# Patient Record
Sex: Female | Born: 1940
Health system: Southern US, Community
[De-identification: ages and names within clinical notes are randomized; demographics above are authoritative.]

## PROBLEM LIST (undated history)

## (undated) DIAGNOSIS — I10 Essential (primary) hypertension: Secondary | ICD-10-CM

## (undated) DIAGNOSIS — E119 Type 2 diabetes mellitus without complications: Secondary | ICD-10-CM

## (undated) HISTORY — PX: TONSILLECTOMY: SUR1361

## (undated) HISTORY — PX: HERNIA REPAIR: SHX51

---

## 2010-03-13 ENCOUNTER — Ambulatory Visit: Payer: Self-pay | Admitting: Cardiology

## 2010-03-13 ENCOUNTER — Inpatient Hospital Stay (HOSPITAL_COMMUNITY)
Admission: EM | Admit: 2010-03-13 | Discharge: 2010-03-16 | Payer: Self-pay | Source: Home / Self Care | Admitting: Emergency Medicine

## 2010-03-13 IMAGING — CT CT ANGIO CHEST
2 of 6 series · 19 of 36 positions shown · IV contrast (APPLIED)
Comparison: Chest x-ray today.

CLINICAL DATA: Shortness of breath and bilateral lower extremity
pain.

CT ANGIOGRAPHY CHEST WITH CONTRAST
TECHNIQUE: Multidetector CT imaging of the chest was performed
using the standard protocol during bolus administration of
intravenous contrast.  Multiplanar CT image reconstructions
including MIPs were obtained to evaluate the vascular anatomy.
Contrast:  100 ml [75] IV

[Series 6: pe xxl thins @1mm · axial · 0.67mm/px · z∈[-205,-7]mm · 18 of 222 slices shown]
[im 12/222  lung]
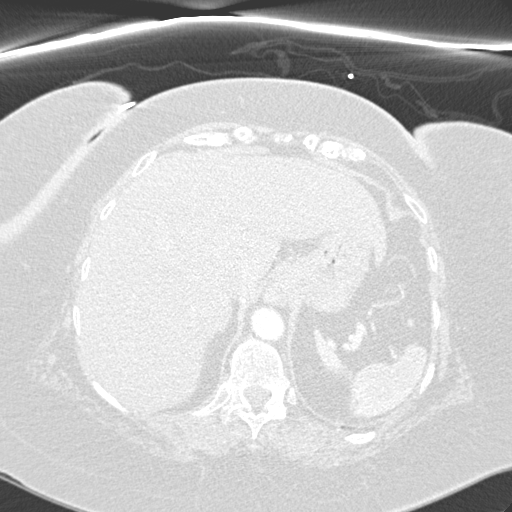
[im 23/222  mediastinal]
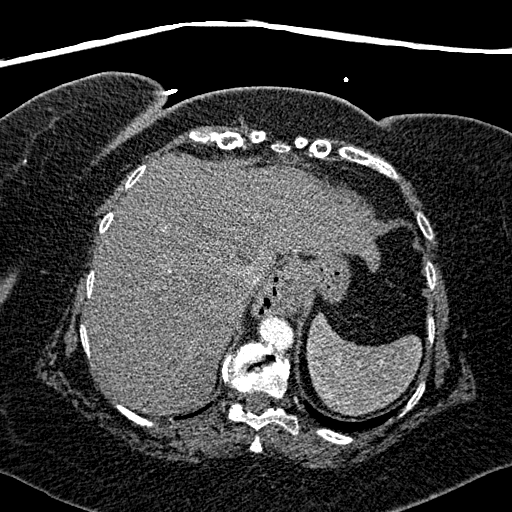
[im 34/222  lung]
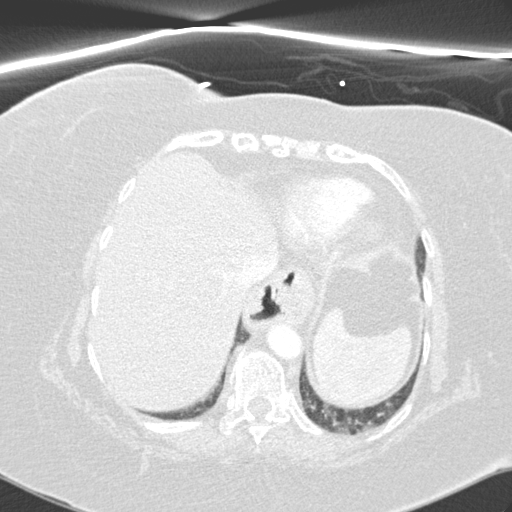
[im 45/222  mediastinal]
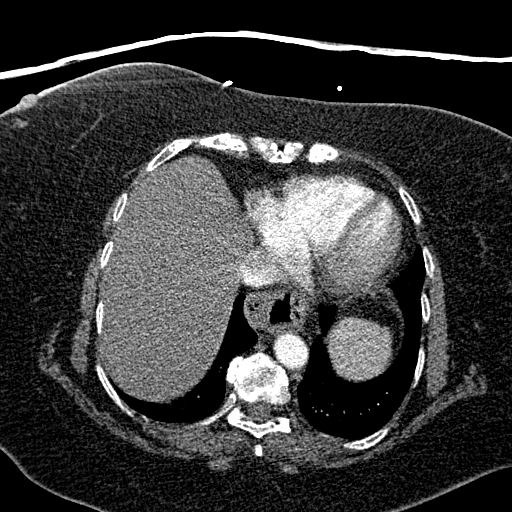
[im 56/222  lung]
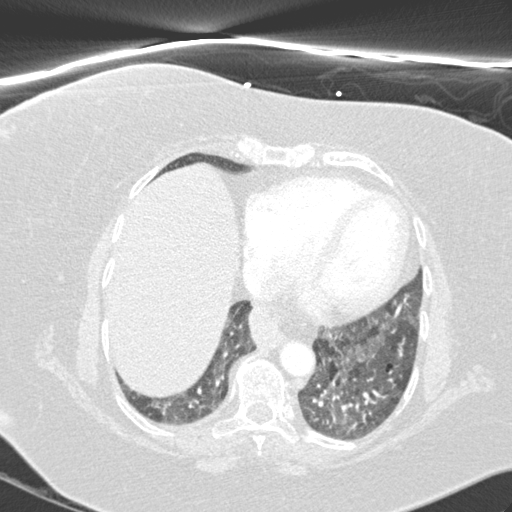
[im 67/222  mediastinal]
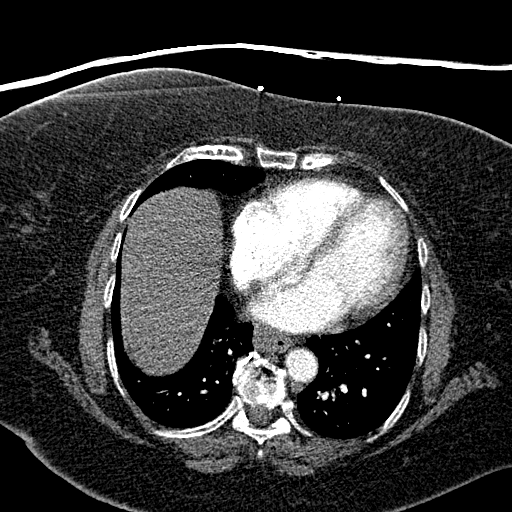
[im 78/222  lung]
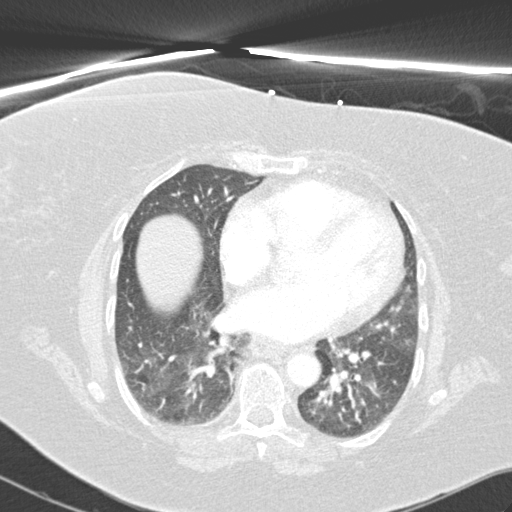
[im 89/222  mediastinal]
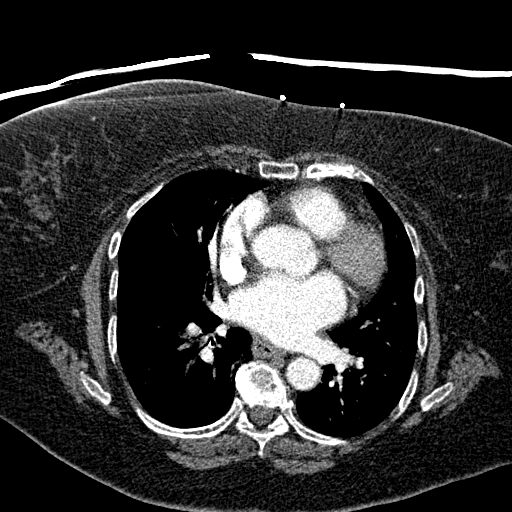
[im 100/222  lung]
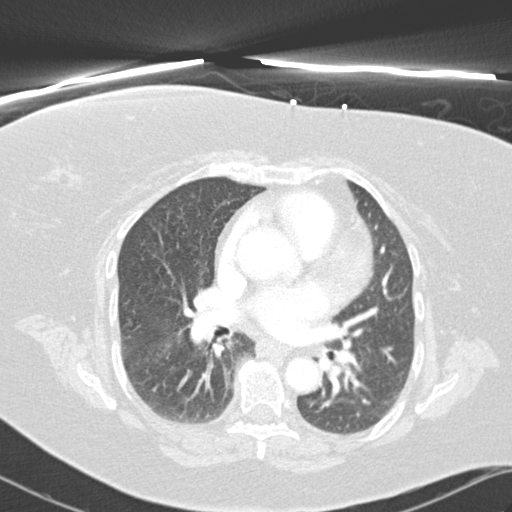
[im 122/222  mediastinal]
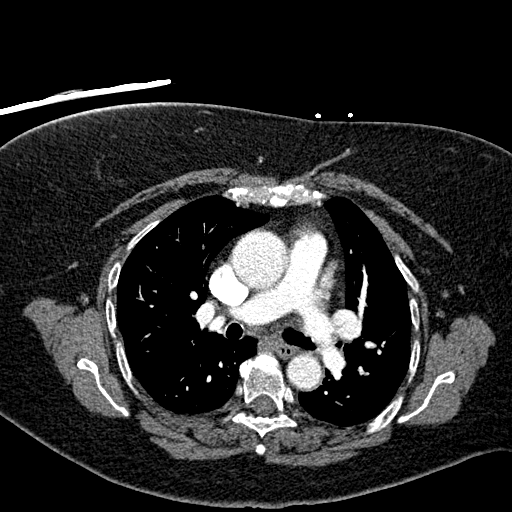
[im 133/222  lung]
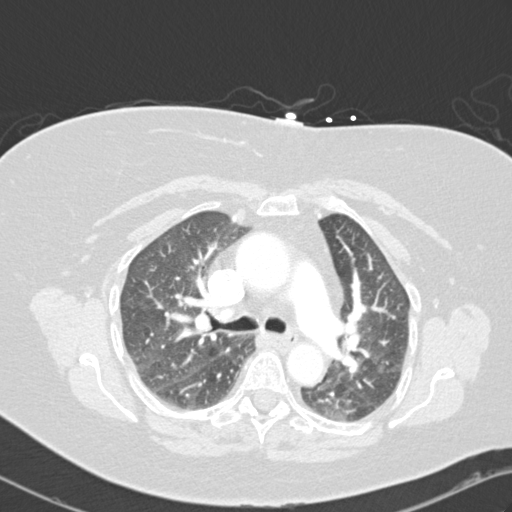
[im 144/222  mediastinal]
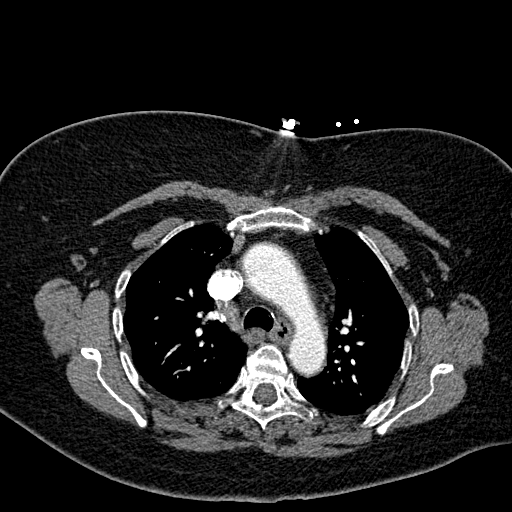
[im 155/222  lung]
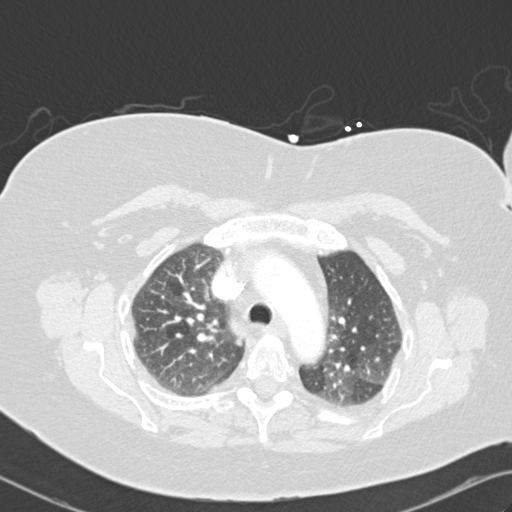
[im 166/222  mediastinal]
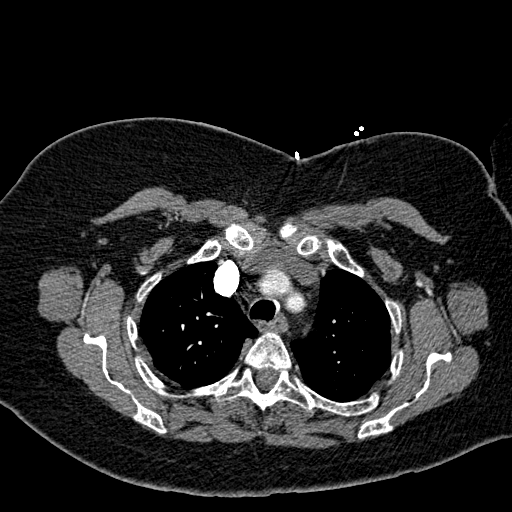
[im 177/222  lung]
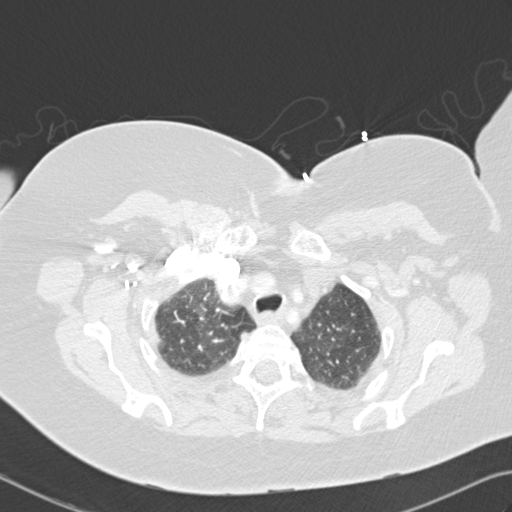
[im 188/222  mediastinal]
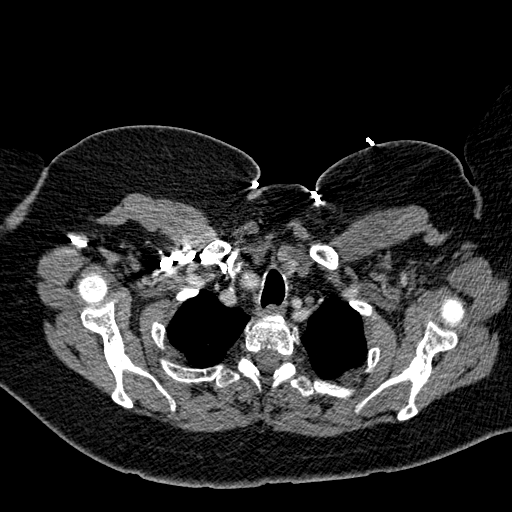
[im 199/222  lung]
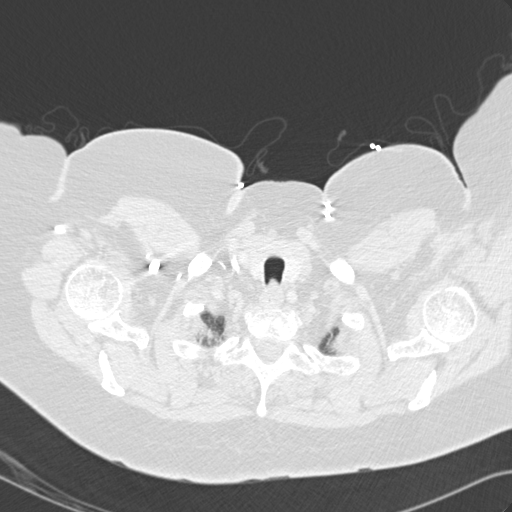
[im 210/222  mediastinal]
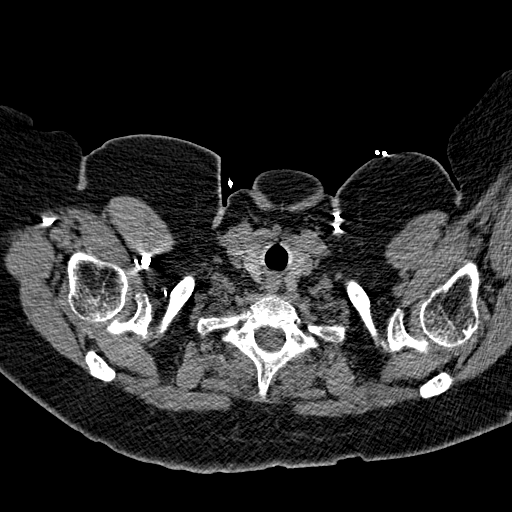

[Series 602: coronal mpr · coronal · 0.67mm/px · 1 of 128 slices shown]
[im 64/128  mediastinal]
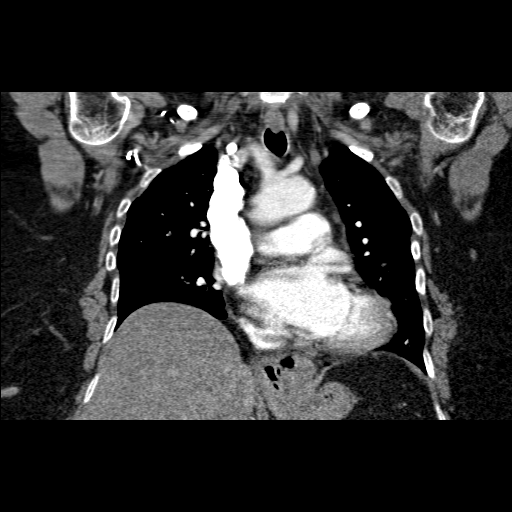

[19 of 36 positions shown; findings below may reference images not displayed]

FINDINGS: The pulmonary arteries are adequately opacified.  No
evidence of pulmonary embolism.  No evidence of thoracic aortic
aneurysmal disease or aortic dissection.

Moderate sized hiatal hernia present.  Lungs demonstrate scattered
areas of bilateral alveolar ground-glass opacity as well as
suggestion of interstitial prominence and distention of the
pulmonary veins.  Findings are likely consistent with interstitial
edema.  The presence of scattered ground-glass opacity may also
suggests some other type of parenchymal lung disease such as
hypersensitivity pneumonitis.  No pulmonary fibrotic changes are
identified.  There are no pleural effusions.  No pericardial fluid
is present.  The heart is mildly enlarged.  No masses or enlarged
lymph nodes identified.  Bony structures are unremarkable.

Review of the MIP images confirms the above findings.
IMPRESSION: No evidence of pulmonary embolism.  Suspect mild interstitial
edema.

## 2010-03-13 IMAGING — CR DG CHEST 2V
2 series · 2 of 2 positions shown · non-contrast
Comparison: None.

CLINICAL DATA: Shortness of breath

CHEST - 2 VIEW

[w chest lat]
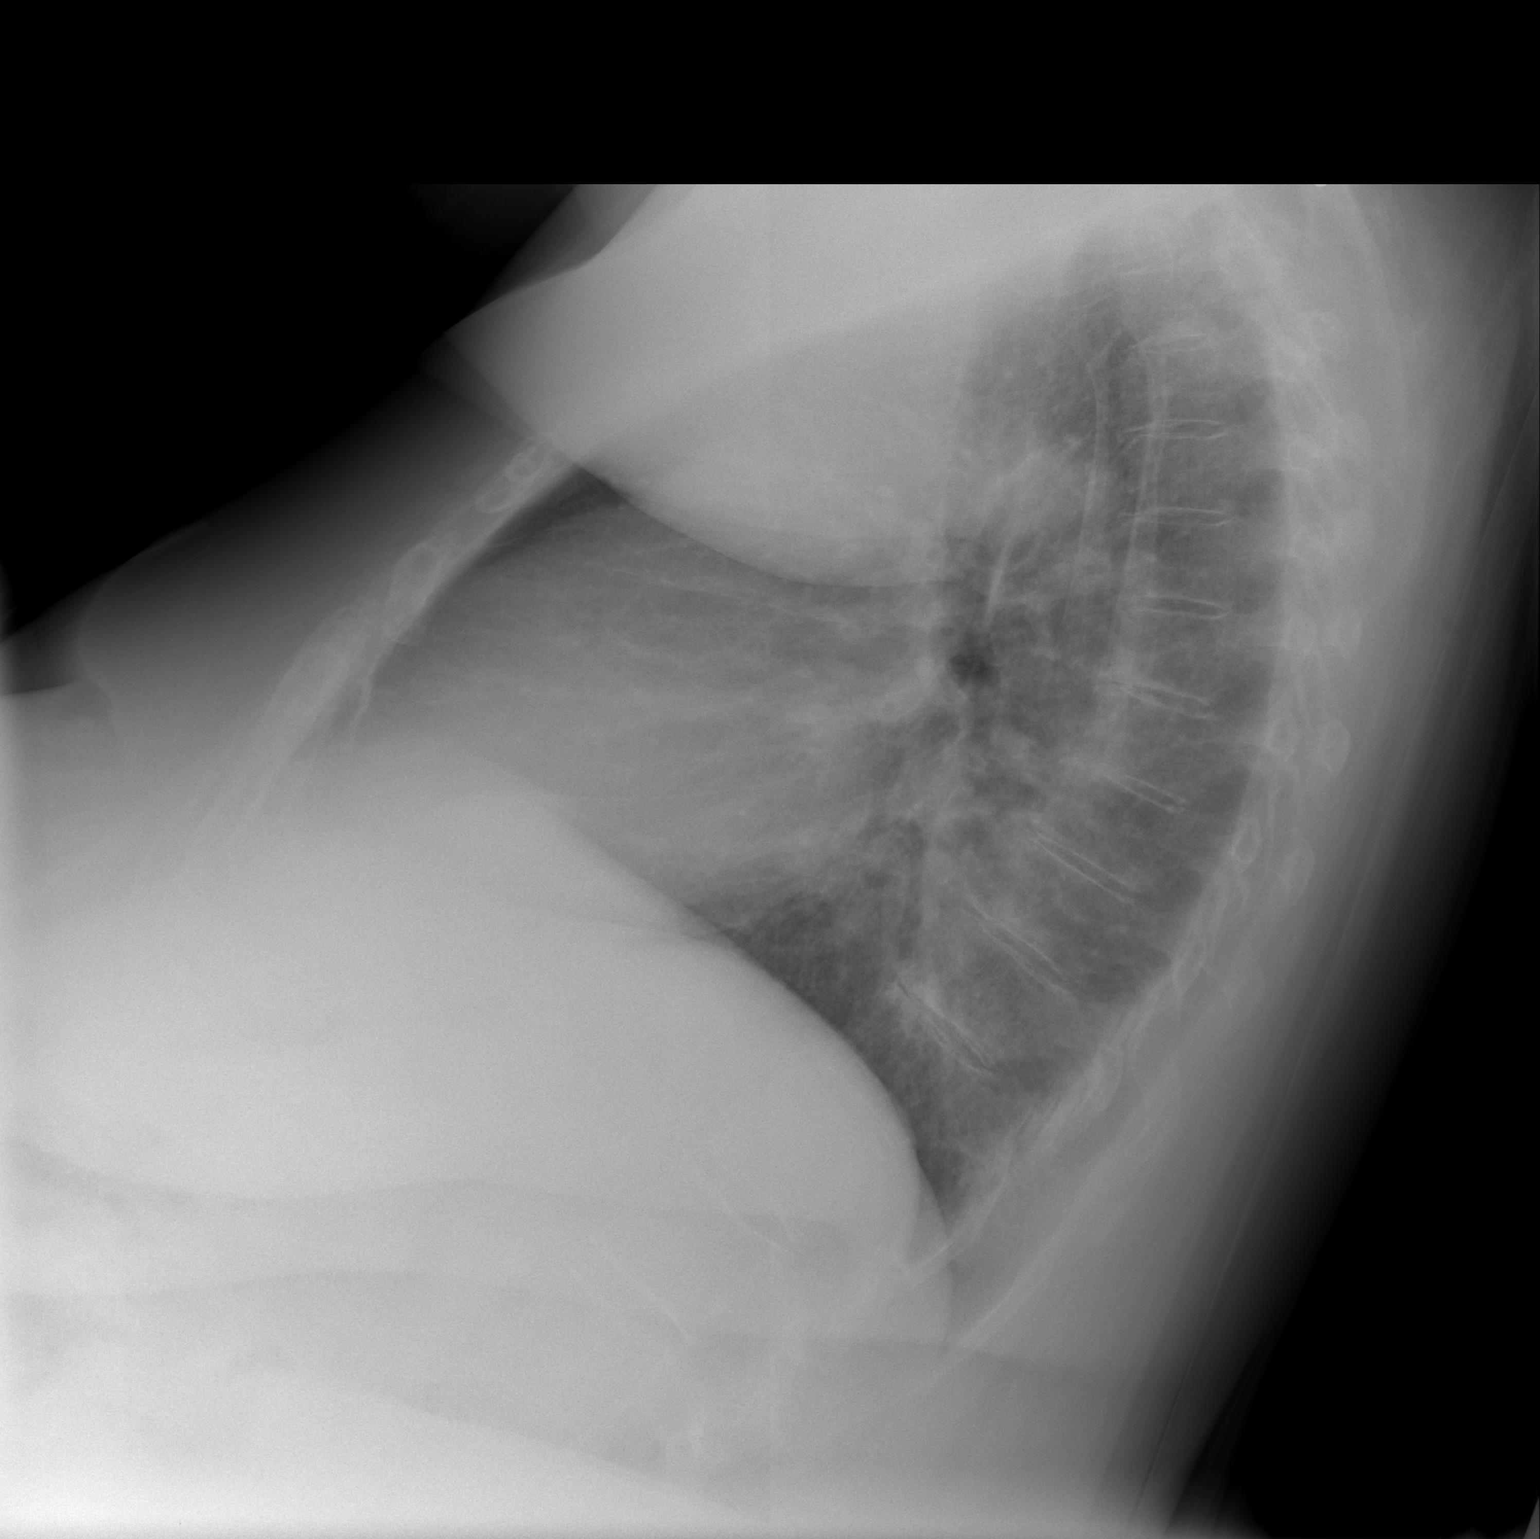

[view not recorded]
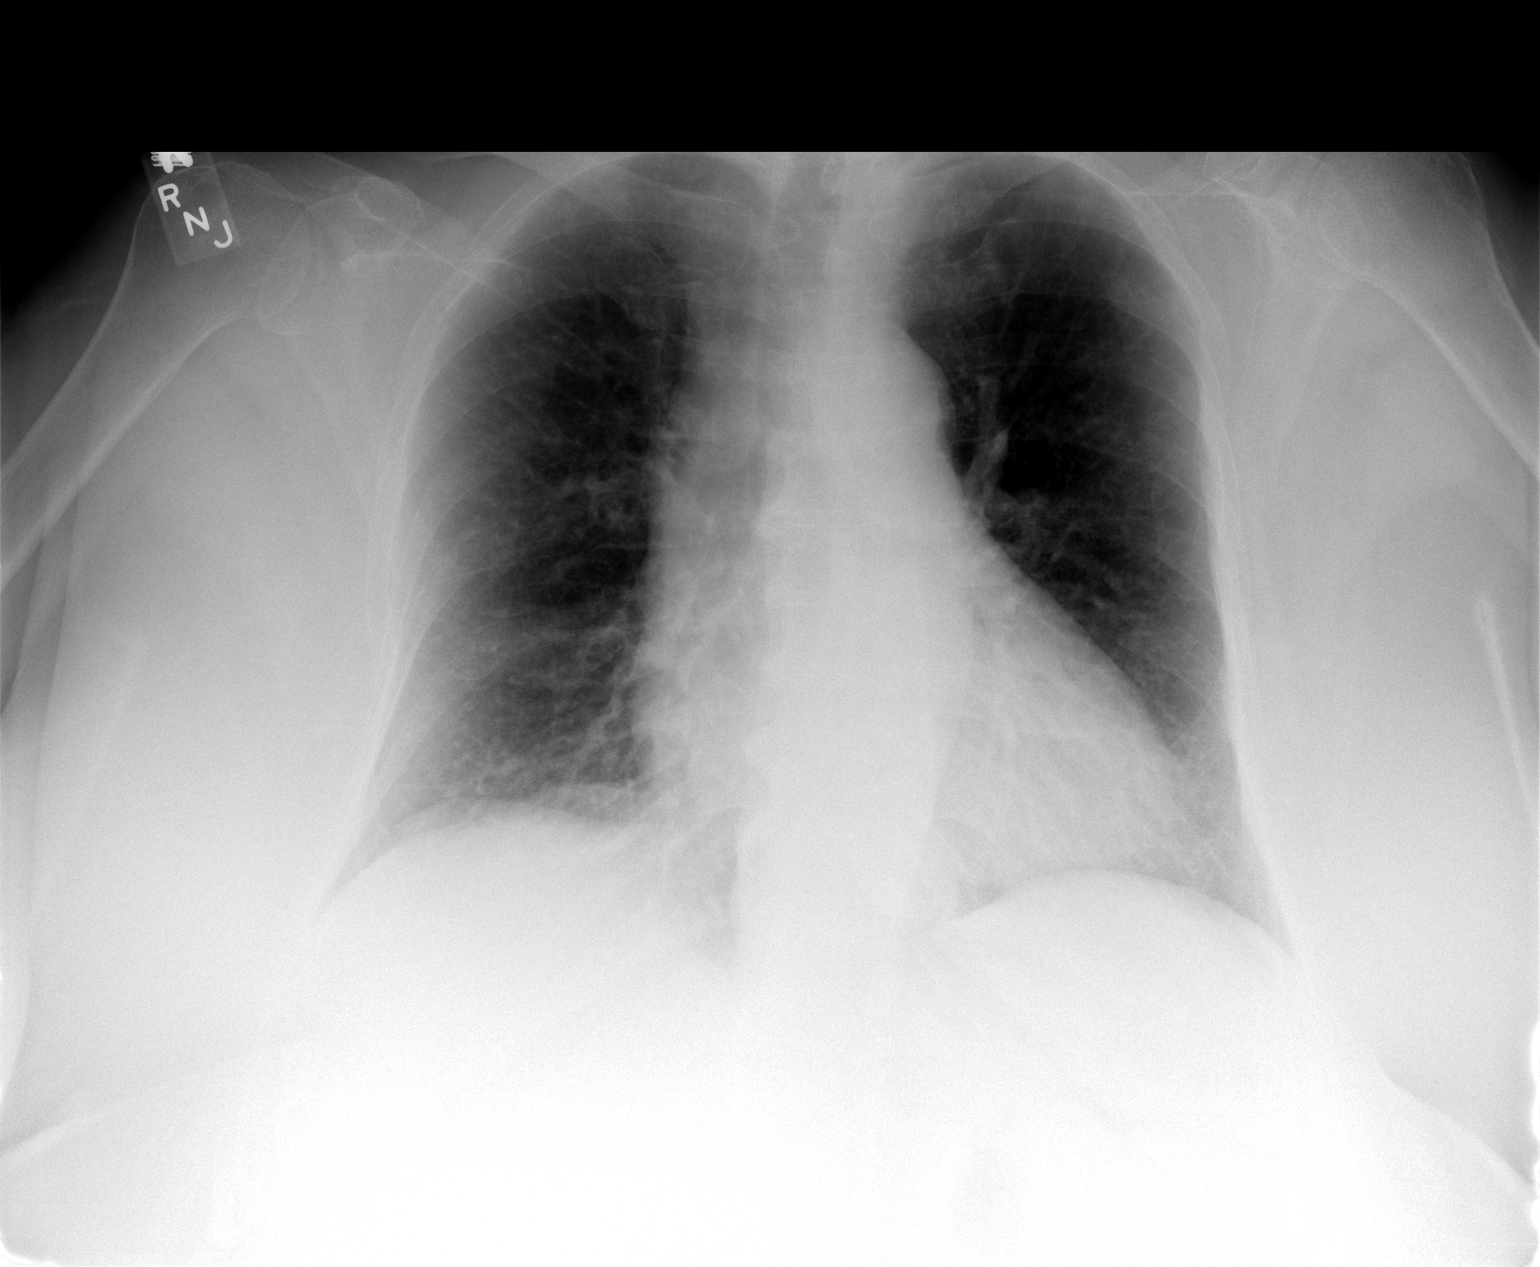

[2 of 2 positions shown; findings below may reference images not displayed]

FINDINGS: Borderline cardiomegaly noted.  Probable chronic mild
interstitial prominence without convincing pulmonary edema.  Mild
hyperinflation.  No focal infiltrate.  No pleural effusion.  Mild
degenerative changes thoracic spine.
IMPRESSION: Cardiomegaly.  Mild hyperinflation.  Probable chronic mild
interstitial prominence.  No focal infiltrate.

## 2010-03-14 ENCOUNTER — Encounter (INDEPENDENT_AMBULATORY_CARE_PROVIDER_SITE_OTHER): Payer: Self-pay | Admitting: Internal Medicine

## 2010-03-25 ENCOUNTER — Observation Stay (HOSPITAL_COMMUNITY)
Admission: EM | Admit: 2010-03-25 | Discharge: 2010-03-26 | Payer: Self-pay | Source: Home / Self Care | Attending: Family Medicine | Admitting: Family Medicine

## 2010-03-25 IMAGING — CR DG CHEST 1V PORT
1 series · 1 of 1 positions shown · non-contrast
Comparison: [DATE] chest CT

CLINICAL DATA: Shortness of breath, rectal bleeding

PORTABLE CHEST - 1 VIEW

[view not recorded]
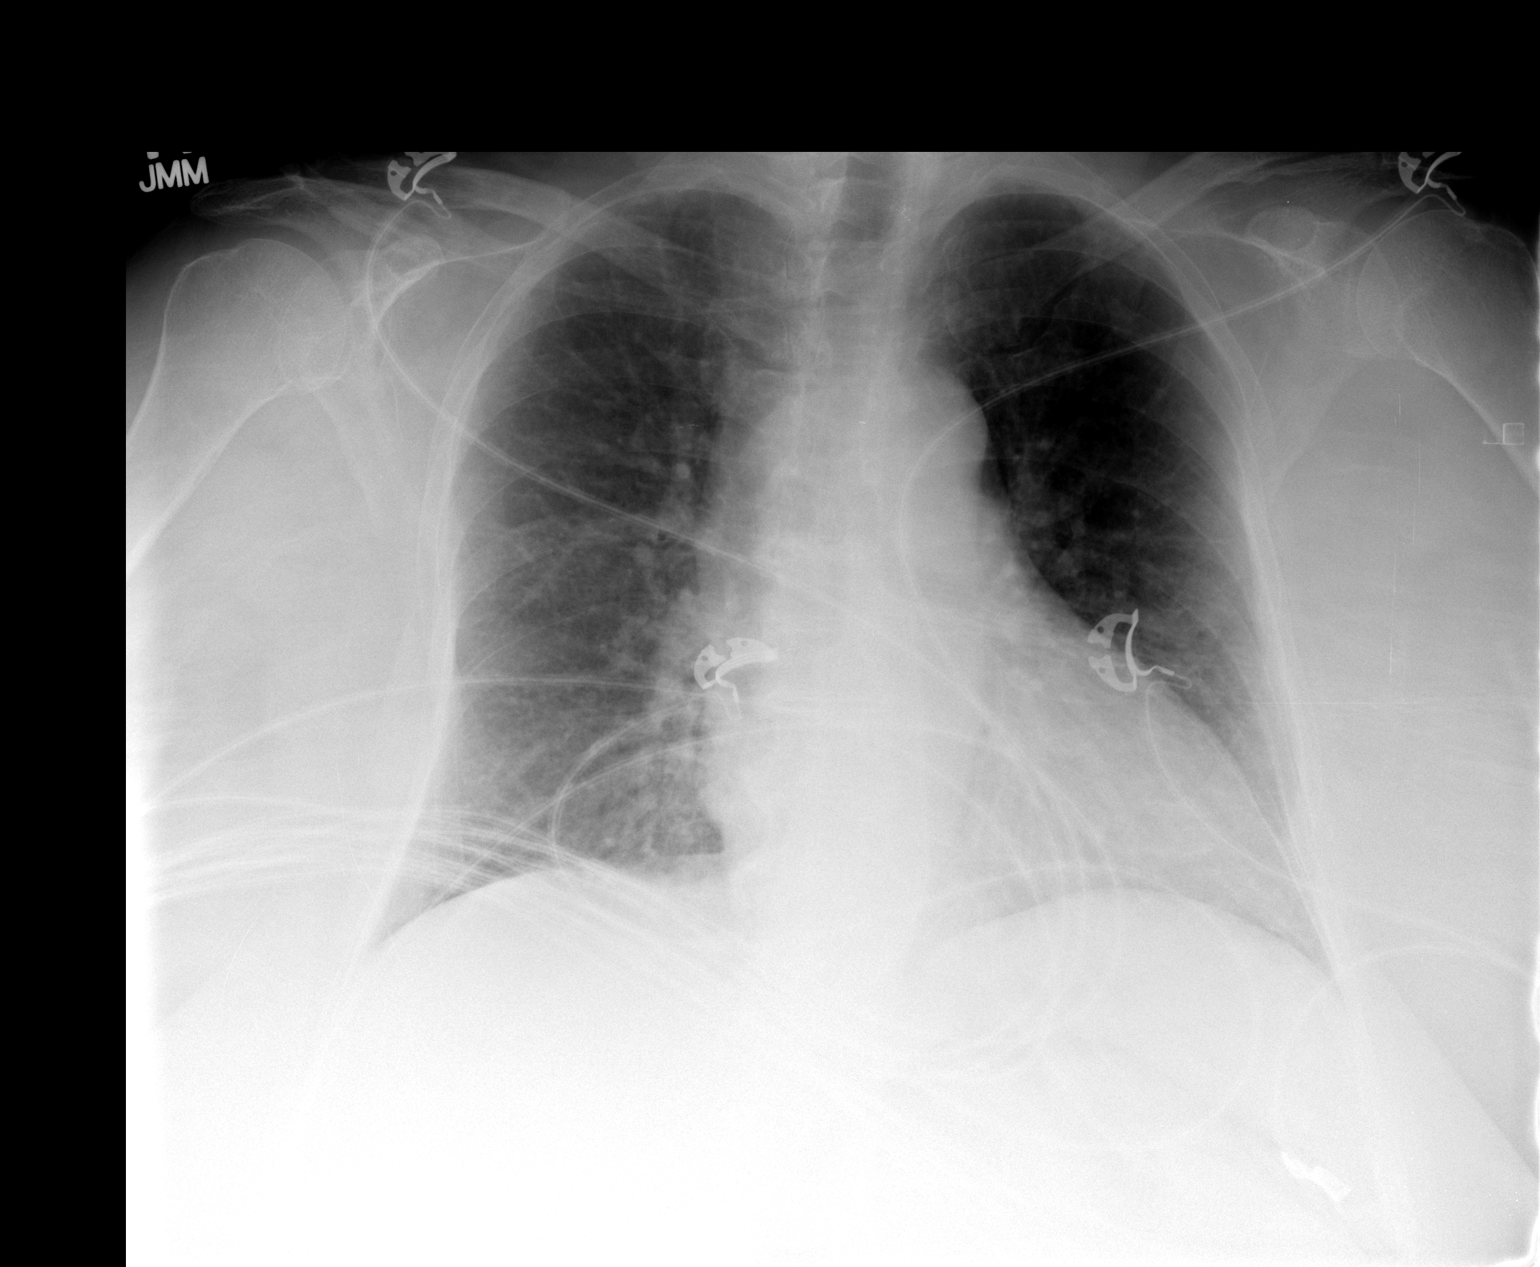

[1 of 1 positions shown; findings below may reference images not displayed]

FINDINGS: Borderline enlargement of the cardiomediastinal
silhouette is noted with central vascular prominence.  No overt
edema.  No focal pulmonary opacity.  No pleural effusion.  Cardiac
leads obscure detail.
IMPRESSION: Borderline cardiomegaly with central vascular congestion but no
overt edema.

## 2010-06-26 LAB — CBC
HCT: 26.5 % — ABNORMAL LOW (ref 36.0–46.0)
HCT: 27.1 % — ABNORMAL LOW (ref 36.0–46.0)
Hemoglobin: 9.1 g/dL — ABNORMAL LOW (ref 12.0–15.0)
Hemoglobin: 11 g/dL — ABNORMAL LOW (ref 12.0–15.0)
Hemoglobin: 9.3 g/dL — ABNORMAL LOW (ref 12.0–15.0)
Hemoglobin: 9.8 g/dL — ABNORMAL LOW (ref 12.0–15.0)
MCH: 29.4 pg (ref 26.0–34.0)
MCH: 29.8 pg (ref 26.0–34.0)
MCH: 30.4 pg (ref 26.0–34.0)
MCHC: 34.3 g/dL (ref 30.0–36.0)
MCHC: 33.8 g/dL (ref 30.0–36.0)
MCV: 85.5 fL (ref 78.0–100.0)
MCV: 88.2 fL (ref 78.0–100.0)
MCV: 89.1 fL (ref 78.0–100.0)
Platelets: 344 10*3/uL (ref 150–400)
RBC: 3.22 MIL/uL — ABNORMAL LOW (ref 3.87–5.11)
RBC: 3.69 MIL/uL — ABNORMAL LOW (ref 3.87–5.11)
RDW: 12.9 % (ref 11.5–15.5)
RDW: 13.8 % (ref 11.5–15.5)
WBC: 11.6 10*3/uL — ABNORMAL HIGH (ref 4.0–10.5)

## 2010-06-26 LAB — BASIC METABOLIC PANEL
BUN: 13 mg/dL (ref 6–23)
BUN: 14 mg/dL (ref 6–23)
CO2: 22 mEq/L (ref 19–32)
Chloride: 101 mEq/L (ref 96–112)
Chloride: 107 mEq/L (ref 96–112)
GFR calc non Af Amer: 49 mL/min — ABNORMAL LOW (ref >60)
Glucose, Bld: 129 mg/dL — ABNORMAL HIGH (ref 70–99)
Glucose, Bld: 147 mg/dL — ABNORMAL HIGH (ref 70–99)
Potassium: 4.1 mEq/L (ref 3.5–5.1)
Potassium: 4.3 mEq/L (ref 3.5–5.1)

## 2010-06-26 LAB — COMPREHENSIVE METABOLIC PANEL
ALT: 18 U/L (ref 0–35)
ALT: 19 U/L (ref 0–35)
AST: 20 U/L (ref 0–37)
AST: 30 U/L (ref 0–37)
Albumin: 3.8 g/dL (ref 3.5–5.2)
CO2: 18 mEq/L — ABNORMAL LOW (ref 19–32)
Calcium: 9 mg/dL (ref 8.4–10.5)
Calcium: 9.6 mg/dL (ref 8.4–10.5)
Creatinine, Ser: 1.19 mg/dL (ref 0.4–1.2)
Creatinine, Ser: 1.69 mg/dL — ABNORMAL HIGH (ref 0.4–1.2)
GFR calc Af Amer: 54 mL/min — ABNORMAL LOW (ref >60)
GFR calc Af Amer: 36 mL/min — ABNORMAL LOW (ref >60)
GFR calc non Af Amer: 45 mL/min — ABNORMAL LOW (ref >60)
Sodium: 133 mEq/L — ABNORMAL LOW (ref 135–145)
Sodium: 131 mEq/L — ABNORMAL LOW (ref 135–145)
Total Protein: 5.8 g/dL — ABNORMAL LOW (ref 6.0–8.3)

## 2010-06-26 LAB — URINALYSIS, ROUTINE W REFLEX MICROSCOPIC
Ketones, ur: 15 mg/dL — AB
Nitrite: NEGATIVE
Protein, ur: NEGATIVE mg/dL
Urobilinogen, UA: 0.2 mg/dL (ref 0.0–1.0)

## 2010-06-26 LAB — GLUCOSE, CAPILLARY
Glucose-Capillary: 140 mg/dL — ABNORMAL HIGH (ref 70–99)
Glucose-Capillary: 149 mg/dL — ABNORMAL HIGH (ref 70–99)
Glucose-Capillary: 151 mg/dL — ABNORMAL HIGH (ref 70–99)
Glucose-Capillary: 114 mg/dL — ABNORMAL HIGH (ref 70–99)
Glucose-Capillary: 89 mg/dL (ref 70–99)

## 2010-06-26 LAB — CULTURE, BLOOD (ROUTINE X 2)
Culture  Setup Time: 201112112058
Culture: NO GROWTH

## 2010-06-26 LAB — ABO/RH: ABO/RH(D): A NEG

## 2010-06-26 LAB — URINE MICROSCOPIC-ADD ON

## 2010-06-26 LAB — HEMOGLOBIN AND HEMATOCRIT, BLOOD: HCT: 29.5 % — ABNORMAL LOW (ref 36.0–46.0)

## 2010-06-26 LAB — URINE CULTURE
Colony Count: NO GROWTH
Culture: NO GROWTH

## 2010-06-26 LAB — POCT I-STAT 3, ART BLOOD GAS (G3+)
Bicarbonate: 18.4 mEq/L — ABNORMAL LOW (ref 20.0–24.0)
pCO2 arterial: 26.1 mmHg — ABNORMAL LOW (ref 35.0–45.0)
pH, Arterial: 7.456 — ABNORMAL HIGH (ref 7.350–7.400)
pO2, Arterial: 152 mmHg — ABNORMAL HIGH (ref 80.0–100.0)

## 2010-06-26 LAB — HEMOCCULT GUIAC POC 1CARD (OFFICE): Fecal Occult Bld: POSITIVE

## 2010-06-26 LAB — TYPE AND SCREEN
ABO/RH(D): A NEG
Antibody Screen: NEGATIVE

## 2010-06-26 LAB — CK TOTAL AND CKMB (NOT AT ARMC)
Relative Index: INVALID (ref 0.0–2.5)
Total CK: 54 U/L (ref 7–177)

## 2010-06-26 LAB — LACTIC ACID, PLASMA: Lactic Acid, Venous: 2.3 mmol/L — ABNORMAL HIGH (ref 0.5–2.2)

## 2010-06-27 LAB — CBC
HCT: 29.4 % — ABNORMAL LOW (ref 36.0–46.0)
Hemoglobin: 10 g/dL — ABNORMAL LOW (ref 12.0–15.0)
MCV: 83.9 fL (ref 78.0–100.0)
Platelets: 286 10*3/uL (ref 150–400)
RBC: 3.13 MIL/uL — ABNORMAL LOW (ref 3.87–5.11)
RBC: 3.28 MIL/uL — ABNORMAL LOW (ref 3.87–5.11)
WBC: 6.4 10*3/uL (ref 4.0–10.5)
WBC: 8.5 10*3/uL (ref 4.0–10.5)

## 2010-06-27 LAB — DIFFERENTIAL
Basophils Relative: 0 % (ref 0–1)
Lymphocytes Relative: 20 % (ref 12–46)
Monocytes Relative: 17 % — ABNORMAL HIGH (ref 3–12)
Neutro Abs: 3.8 10*3/uL (ref 1.7–7.7)
Neutrophils Relative %: 60 % (ref 43–77)

## 2010-06-27 LAB — LIPID PANEL
Cholesterol: 113 mg/dL (ref 0–200)
HDL: 47 mg/dL (ref >39)
Triglycerides: 78 mg/dL (ref <150)

## 2010-06-27 LAB — CORTISOL-PM, BLOOD: Cortisol - PM: 7.1 ug/dL (ref 3.1–16.7)

## 2010-06-27 LAB — BLOOD GAS, ARTERIAL
Bicarbonate: 16.7 mEq/L — ABNORMAL LOW (ref 20.0–24.0)
TCO2: 15.1 mmol/L (ref 0–100)
pCO2 arterial: 18.7 mmHg — CL (ref 35.0–45.0)
pH, Arterial: 7.558 — ABNORMAL HIGH (ref 7.350–7.400)

## 2010-06-27 LAB — URINE MICROSCOPIC-ADD ON

## 2010-06-27 LAB — GLUCOSE, CAPILLARY
Glucose-Capillary: 105 mg/dL — ABNORMAL HIGH (ref 70–99)
Glucose-Capillary: 135 mg/dL — ABNORMAL HIGH (ref 70–99)
Glucose-Capillary: 137 mg/dL — ABNORMAL HIGH (ref 70–99)
Glucose-Capillary: 178 mg/dL — ABNORMAL HIGH (ref 70–99)

## 2010-06-27 LAB — POCT I-STAT, CHEM 8
BUN: 27 mg/dL — ABNORMAL HIGH (ref 6–23)
Calcium, Ion: 1.02 mmol/L — ABNORMAL LOW (ref 1.12–1.32)
Creatinine, Ser: 1.3 mg/dL — ABNORMAL HIGH (ref 0.4–1.2)
Hemoglobin: 11.2 g/dL — ABNORMAL LOW (ref 12.0–15.0)
Sodium: 121 mEq/L — ABNORMAL LOW (ref 135–145)
TCO2: 19 mmol/L (ref 0–100)

## 2010-06-27 LAB — HEPATIC FUNCTION PANEL
Albumin: 3.4 g/dL — ABNORMAL LOW (ref 3.5–5.2)
Alkaline Phosphatase: 74 U/L (ref 39–117)
Indirect Bilirubin: 1.1 mg/dL — ABNORMAL HIGH (ref 0.3–0.9)
Total Bilirubin: 1.2 mg/dL (ref 0.3–1.2)
Total Protein: 6.2 g/dL (ref 6.0–8.3)

## 2010-06-27 LAB — PROTIME-INR: Prothrombin Time: 13.7 seconds (ref 11.6–15.2)

## 2010-06-27 LAB — BASIC METABOLIC PANEL
Chloride: 96 mEq/L (ref 96–112)
Creatinine, Ser: 1.22 mg/dL — ABNORMAL HIGH (ref 0.4–1.2)
GFR calc Af Amer: 53 mL/min — ABNORMAL LOW (ref >60)
GFR calc non Af Amer: 44 mL/min — ABNORMAL LOW (ref >60)

## 2010-06-27 LAB — URINE CULTURE: Culture: NO GROWTH

## 2010-06-27 LAB — LIPASE, BLOOD: Lipase: 46 U/L (ref 11–59)

## 2010-06-27 LAB — CARDIAC PANEL(CRET KIN+CKTOT+MB+TROPI)
CK, MB: 1.7 ng/mL (ref 0.3–4.0)
Troponin I: 0.01 ng/mL (ref 0.00–0.06)

## 2010-06-27 LAB — CK TOTAL AND CKMB (NOT AT ARMC)
CK, MB: 2.1 ng/mL (ref 0.3–4.0)
Total CK: 113 U/L (ref 7–177)

## 2010-06-27 LAB — TSH: TSH: 1.832 u[IU]/mL (ref 0.350–4.500)

## 2010-06-27 LAB — URINALYSIS, ROUTINE W REFLEX MICROSCOPIC
Glucose, UA: NEGATIVE mg/dL
Ketones, ur: 15 mg/dL — AB
Specific Gravity, Urine: 1.008 (ref 1.005–1.030)
pH: 7.5 (ref 5.0–8.0)

## 2010-06-27 LAB — HEMOGLOBIN A1C: Hgb A1c MFr Bld: 7.1 % — ABNORMAL HIGH (ref <5.7)

## 2010-06-27 LAB — TROPONIN I: Troponin I: 0.01 ng/mL (ref 0.00–0.06)

## 2010-06-27 LAB — BRAIN NATRIURETIC PEPTIDE: Pro B Natriuretic peptide (BNP): 39.6 pg/mL (ref 0.0–100.0)

## 2010-12-24 DIAGNOSIS — Z87440 Personal history of urinary (tract) infections: Secondary | ICD-10-CM | POA: Insufficient documentation

## 2010-12-24 DIAGNOSIS — K219 Gastro-esophageal reflux disease without esophagitis: Secondary | ICD-10-CM | POA: Insufficient documentation

## 2013-10-09 ENCOUNTER — Emergency Department (HOSPITAL_COMMUNITY)
Admission: EM | Admit: 2013-10-09 | Discharge: 2013-10-09 | Disposition: A | Discharge status: Home / Self Care | Payer: Medicare HMO | Attending: Emergency Medicine | Admitting: Emergency Medicine

## 2013-10-09 ENCOUNTER — Encounter (HOSPITAL_COMMUNITY): Payer: Self-pay | Admitting: Emergency Medicine

## 2013-10-09 DIAGNOSIS — F29 Unspecified psychosis not due to a substance or known physiological condition: Secondary | ICD-10-CM | POA: Insufficient documentation

## 2013-10-09 DIAGNOSIS — E1169 Type 2 diabetes mellitus with other specified complication: Secondary | ICD-10-CM | POA: Insufficient documentation

## 2013-10-09 DIAGNOSIS — Z794 Long term (current) use of insulin: Secondary | ICD-10-CM | POA: Insufficient documentation

## 2013-10-09 DIAGNOSIS — Z79899 Other long term (current) drug therapy: Secondary | ICD-10-CM | POA: Insufficient documentation

## 2013-10-09 DIAGNOSIS — E162 Hypoglycemia, unspecified: Secondary | ICD-10-CM

## 2013-10-09 HISTORY — DX: Type 2 diabetes mellitus without complications: E11.9

## 2013-10-09 LAB — COMPREHENSIVE METABOLIC PANEL
ALT: 17 U/L (ref 0–35)
AST: 23 U/L (ref 0–37)
Albumin: 3.4 g/dL — ABNORMAL LOW (ref 3.5–5.2)
Alkaline Phosphatase: 62 U/L (ref 39–117)
BILIRUBIN TOTAL: 0.6 mg/dL (ref 0.3–1.2)
BUN: 33 mg/dL — AB (ref 6–23)
CHLORIDE: 100 meq/L (ref 96–112)
CO2: 19 meq/L (ref 19–32)
CREATININE: 1.19 mg/dL — AB (ref 0.50–1.10)
Calcium: 9.8 mg/dL (ref 8.4–10.5)
GFR, EST AFRICAN AMERICAN: 51 mL/min — AB (ref >90)
GFR, EST NON AFRICAN AMERICAN: 44 mL/min — AB (ref >90)
GLUCOSE: 74 mg/dL (ref 70–99)
Potassium: 4.2 mEq/L (ref 3.7–5.3)
Sodium: 135 mEq/L — ABNORMAL LOW (ref 137–147)
Total Protein: 6.9 g/dL (ref 6.0–8.3)

## 2013-10-09 LAB — CBC WITH DIFFERENTIAL/PLATELET
Basophils Absolute: 0 10*3/uL (ref 0.0–0.1)
Basophils Relative: 0 % (ref 0–1)
EOS ABS: 0.3 10*3/uL (ref 0.0–0.7)
Eosinophils Relative: 4 % (ref 0–5)
HCT: 34.3 % — ABNORMAL LOW (ref 36.0–46.0)
Hemoglobin: 12.4 g/dL (ref 12.0–15.0)
LYMPHS ABS: 2.3 10*3/uL (ref 0.7–4.0)
LYMPHS PCT: 28 % (ref 12–46)
MCH: 32.7 pg (ref 26.0–34.0)
MCHC: 36.2 g/dL — ABNORMAL HIGH (ref 30.0–36.0)
MCV: 90.5 fL (ref 78.0–100.0)
Monocytes Absolute: 1.1 10*3/uL — ABNORMAL HIGH (ref 0.1–1.0)
Monocytes Relative: 13 % — ABNORMAL HIGH (ref 3–12)
NEUTROS ABS: 4.4 10*3/uL (ref 1.7–7.7)
NEUTROS PCT: 55 % (ref 43–77)
PLATELETS: 246 10*3/uL (ref 150–400)
RBC: 3.79 MIL/uL — AB (ref 3.87–5.11)
RDW: 13.2 % (ref 11.5–15.5)
WBC: 8.1 10*3/uL (ref 4.0–10.5)

## 2013-10-09 LAB — CBG MONITORING, ED
GLUCOSE-CAPILLARY: 119 mg/dL — AB (ref 70–99)
Glucose-Capillary: 41 mg/dL — CL (ref 70–99)
Glucose-Capillary: 71 mg/dL (ref 70–99)

## 2013-10-09 MED ORDER — DEXTROSE 50 % IV SOLN: 1.0000 | Freq: Once | INTRAVENOUS | Status: DC

## 2013-10-09 MED ORDER — GLUCOSE 40 % PO GEL
1.0000 | Freq: Once | ORAL | Status: AC
  Administered 2013-10-09: 37.5 g via ORAL
  Filled 2013-10-09: qty 1

## 2013-10-09 NOTE — Discharge Instructions (Signed)
Recommend you take 20 units Novolin in the morning and 15 units at night. Be sure to check your blood sugar at multiple times during the day to ensure that you do not become hypoglycemic. Followup with your primary doctor on Monday. Return to the emergency department as needed if symptoms worsen.  Hypoglycemia Hypoglycemia occurs when the glucose in your blood is too low. Glucose is a type of sugar that is your body's main energy source. Hormones, such as insulin and glucagon, control the level of glucose in the blood. Insulin lowers blood glucose and glucagon increases blood glucose. Having too much insulin in your blood stream, or not eating enough food containing sugar, can result in hypoglycemia. Hypoglycemia can happen to people with or without diabetes. It can develop quickly and can be a medical emergency.  CAUSES   Missing or delaying meals.  Not eating enough carbohydrates at meals.  Taking too much diabetes medicine.  Not timing your oral diabetes medicine or insulin doses with meals, snacks, and exercise.  Nausea and vomiting.  Certain medicines.  Severe illnesses, such as hepatitis, kidney disorders, and certain eating disorders.  Increased activity or exercise without eating something extra or adjusting medicines.  Drinking too much alcohol.  A nerve disorder that affects body functions like your heart rate, blood pressure, and digestion (autonomic neuropathy).  A condition where the stomach muscles do not function properly (gastroparesis). Therefore, medicines and food may not absorb properly.  Rarely, a tumor of the pancreas can produce too much insulin. SYMPTOMS   Hunger.  Sweating (diaphoresis).  Change in body temperature.  Shakiness.  Headache.  Anxiety.  Lightheadedness.  Irritability.  Difficulty concentrating.  Dry mouth.  Tingling or numbness in the hands or feet.  Restless sleep or sleep disturbances.  Altered speech and  coordination.  Change in mental status.  Seizures or prolonged convulsions.  Combativeness.  Drowsiness (lethargic).  Weakness.  Increased heart rate or palpitations.  Confusion.  Pale, gray skin color.  Blurred or double vision.  Fainting. DIAGNOSIS  A physical exam and medical history will be performed. Your caregiver may make a diagnosis based on your symptoms. Blood tests and other lab tests may be performed to confirm a diagnosis. Once the diagnosis is made, your caregiver will see if your signs and symptoms go away once your blood glucose is raised.  TREATMENT  Usually, you can easily treat your hypoglycemia when you notice symptoms.  Check your blood glucose. If it is less than 70 mg/dl, take one of the following:   3-4 glucose tablets.    cup juice.    cup regular soda.   1 cup skim milk.   -1 tube of glucose gel.   5-6 hard candies.   Avoid high-fat drinks or food that may delay a rise in blood glucose levels.  Do not take more than the recommended amount of sugary foods, drinks, gel, or tablets. Doing so will cause your blood glucose to go too high.   Wait 10-15 minutes and recheck your blood glucose. If it is still less than 70 mg/dl or below your target range, repeat treatment.   Eat a snack if it is more than 1 hour until your next meal.  There may be a time when your blood glucose may go so low that you are unable to treat yourself at home when you start to notice symptoms. You may need someone to help you. You may even faint or be unable to swallow. If you cannot  treat yourself, someone will need to bring you to the hospital.  HOME CARE INSTRUCTIONS  If you have diabetes, follow your diabetes management plan by:  Taking your medicines as directed.  Following your exercise plan.  Following your meal plan. Do not skip meals. Eat on time.  Testing your blood glucose regularly. Check your blood glucose before and after exercise. If you  exercise longer or different than usual, be sure to check blood glucose more frequently.  Wearing your medical alert jewelry that says you have diabetes.  Identify the cause of your hypoglycemia. Then, develop ways to prevent the recurrence of hypoglycemia.  Do not take a hot bath or shower right after an insulin shot.  Always carry treatment with you. Glucose tablets are the easiest to carry.  If you are going to drink alcohol, drink it only with meals.  Tell friends or family members ways to keep you safe during a seizure. This may include removing hard or sharp objects from the area or turning you on your side.  Maintain a healthy weight. SEEK MEDICAL CARE IF:   You are having problems keeping your blood glucose in your target range.  You are having frequent episodes of hypoglycemia.  You feel you might be having side effects from your medicines.  You are not sure why your blood glucose is dropping so low.  You notice a change in vision or a new problem with your vision. SEEK IMMEDIATE MEDICAL CARE IF:   Confusion develops.  A change in mental status occurs.  The inability to swallow develops.  Fainting occurs. Document Released: 04/01/2005 Document Revised: 04/06/2013 Document Reviewed: 07/29/2011 [facility] Patient Information 2015 [employer] [place]. This information is not intended to replace advice given to you by your health care provider. Make sure you discuss any questions you have with your health care provider.

## 2013-10-09 NOTE — ED Notes (Signed)
Pt eating peanut butter and graham crackers.

## 2013-10-09 NOTE — ED Notes (Signed)
Pt reports hypoglycemia tonight.  When she checked it it was 46.  Ate a handful of sugar and came to the ED. Pt reports not feeling well.  Pt is DM Type 1

## 2013-10-09 NOTE — ED Provider Notes (Signed)
CSN: [id number]     Arrival date & time 10/09/13  0218 History   First MD Initiated Contact with Patient 10/09/13 0258     Chief Complaint  Patient presents with  . Hypoglycemia    (Consider location/radiation/quality/duration/timing/severity/associated sxs/prior Treatment) HPI Comments: Patient is a 73 year old female the history of diabetes mellitus who presents to the emergency department for hypoglycemia. Patient states that she awoke during the evening and her husband states that she was speaking and incoherent sentences. Patient checked her blood sugar at this time and found it to be 46. Patient states that she ate a handful of sugar and then came to the ED. Patient states she recently had her insulin regimen changed from Novolin N 22 units BID to Novolin N 20 units in the morning and 25 units in the evening. She states she feels as though her sugars drop lower at night since this change. Patient denies associated fever, upper respiratory symptoms, chest pain, shortness of breath, nausea or vomiting, abdominal pain, diarrhea, melena or hematochezia, urinary symptoms, numbness/tingling, weakness, and syncope.  Patient is a 73 y.o. female presenting with hypoglycemia. The history is provided by the patient. No language interpreter was used.  Hypoglycemia   Past Medical History  Diagnosis Date  . Diabetes mellitus without complication    Past Surgical History  Procedure Laterality Date  . Hernia repair    . Tonsillectomy     No family history on file. History  Substance Use Topics  . Smoking status: Never Smoker   . Smokeless tobacco: Not on file  . Alcohol Use: No   OB History   Grav Para Term Preterm Abortions TAB SAB Ect Mult Living                  Review of Systems  Psychiatric/Behavioral: Positive for confusion.  All other systems reviewed and are negative.    Allergies  Review of patient's allergies indicates no known allergies.  Home Medications   Prior to  Admission medications   Medication Sig Start Date End Date Taking? Authorizing Provider  beta carotene w/minerals (OCUVITE) tablet Take 1 tablet by mouth daily.   Yes Historical Provider, MD  glipiZIDE (GLUCOTROL) 10 MG tablet Take 10 mg by mouth daily before breakfast.   Yes Historical Provider, MD  hydrochlorothiazide (HYDRODIURIL) 25 MG tablet Take 25 mg by mouth daily.   Yes Historical Provider, MD  ibuprofen (ADVIL,MOTRIN) 800 MG tablet Take 800 mg by mouth every 8 (eight) hours as needed for mild pain.   Yes Historical Provider, MD  insulin NPH Human (HUMULIN N,NOVOLIN N) 100 UNIT/ML injection Inject 22 Units into the skin daily before breakfast.   Yes Historical Provider, MD  lisinopril (PRINIVIL,ZESTRIL) 10 MG tablet Take 10 mg by mouth daily.   Yes Historical Provider, MD  metFORMIN (GLUCOPHAGE) 500 MG tablet Take 500 mg by mouth 2 (two) times daily with a meal.   Yes Historical Provider, MD  omeprazole (PRILOSEC) 40 MG capsule Take 40 mg by mouth daily.   Yes Historical Provider, MD  trimethoprim (TRIMPEX) 100 MG tablet Take 100 mg by mouth daily.   Yes Historical Provider, MD   BP 127/66  Pulse 74  Temp(Src) 98 F (36.7 C)  Resp 16  SpO2 100%  Physical Exam  Nursing note and vitals reviewed. Constitutional: She is oriented to person, place, and time. She appears well-developed and well-nourished. No distress.  Nontoxic/nonseptic appearing  HENT:  Head: Normocephalic and atraumatic.  Mouth/Throat: Oropharynx is clear  and moist. No oropharyngeal exudate.  Eyes: Conjunctivae and EOM are normal. No scleral icterus.  Neck: Normal range of motion.  Cardiovascular: Normal rate and regular rhythm.   Pulmonary/Chest: Effort normal and breath sounds normal. No respiratory distress. She has no wheezes. She has no rales.  Abdominal: Soft. There is no tenderness. There is no rebound and no guarding.  Soft obese abdomen. No masses or tenderness.  Musculoskeletal: Normal range of motion.   Neurological: She is alert and oriented to person, place, and time.  GCS 15. Patient speaks in full goal oriented sentences. She moves her extremities without ataxia.  Skin: Skin is warm and dry. No rash noted. She is not diaphoretic. No erythema. No pallor.  Psychiatric: She has a normal mood and affect. Her behavior is normal.    ED Course  Procedures (including critical care time) Labs Review Labs Reviewed  COMPREHENSIVE METABOLIC PANEL - Abnormal; Notable for the following:    Sodium 135 (*)    BUN 33 (*)    Creatinine, Ser 1.19 (*)    Albumin 3.4 (*)    GFR calc non Af Amer 44 (*)    GFR calc Af Amer 51 (*)    All other components within normal limits  CBC WITH DIFFERENTIAL - Abnormal; Notable for the following:    RBC 3.79 (*)    HCT 34.3 (*)    MCHC 36.2 (*)    Monocytes Relative 13 (*)    Monocytes Absolute 1.1 (*)    All other components within normal limits  CBG MONITORING, ED - Abnormal; Notable for the following:    Glucose-Capillary 41 (*)    All other components within normal limits  CBG MONITORING, ED - Abnormal; Notable for the following:    Glucose-Capillary 119 (*)    All other components within normal limits  URINALYSIS, ROUTINE W REFLEX MICROSCOPIC  CBG MONITORING, ED    Imaging Review No results found.   Date: 10/09/2013  Rate: 72  Rhythm: normal sinus rhythm  QRS Axis: normal  Intervals: normal  ST/T Wave abnormalities: normal  Conduction Disutrbances:none  Narrative Interpretation: NSR. No STEMI or ischemic change.  Old EKG Reviewed: none available   MDM   Final diagnoses:  Hypoglycemia    73 year old female with history of diabetes mellitus presents for hypoglycemia. CBG on arrival 41. Patient states that she recently had her daily Novolin regimen changed from 22 units twice a day to 20 units in the morning and 25 units in the evening. On arrival, patient mentating at baseline. No focal neurologic deficits appreciated. Patient has no  complaints of pain.  Labs reviewed which are consistent with patient's baseline. EKG shows NSR. Hypoglycemia treated with dextrose gel and PO food intake while in ED. CBG improved to 119. Patient stable and appropriate for discharge with instruction to followup with her primary care provider on Monday. Advised that he should decrease her nightly dose of Novolin to 15 units until able to followup with her primary care doctor. Return precautions provided and patient agreeable to plan with no concerns.   Filed Vitals:   10/09/13 0244  BP: 127/66  Pulse: 74  Temp: 98 F (36.7 C)  Resp: 16  SpO2: 100%     Antony MaduraKelly [employer], PA-C 10/09/13 0745

## 2013-10-12 NOTE — ED Provider Notes (Signed)
Medical screening examination/treatment/procedure(s) were conducted as a shared visit with non-physician practitioner(s) and myself.  I personally evaluated the patient during the encounter.   EKG Interpretation   Date/Time:  Saturday October 09 2013 03:12:48 EDT Ventricular Rate:  72 PR Interval:  138 QRS Duration: 99 QT Interval:  390 QTC Calculation: 427 R Axis:   1 Text Interpretation:  Sinus rhythm Low voltage, precordial leads ED  PHYSICIAN INTERPRETATION AVAILABLE IN [facility] Confirmed by TEST,  Record ([id number]) on 10/11/2013 7:50:25 AM      This elderly woman with insulin dependent type II DM presents after hypoglycemia to 45 mg/dL. She self treated successfully at home PTA. Of note, her insulin regimen was recently changed with a substantial increase to her evening dose of Novolin N.  She has tolerated po in the ED and been observed without recurrent of hypoglycemia. We will decrease her evening dose of Novolin N and ask her to follow up with her primary care provider who manages her DM in the next 48 hrs for recheck and review of med changes.   Brandt LoosenJulie Manly, MD 10/12/13 [phone]

## 2014-04-24 ENCOUNTER — Encounter (HOSPITAL_COMMUNITY): Payer: Self-pay | Admitting: *Deleted

## 2014-04-24 ENCOUNTER — Emergency Department (HOSPITAL_COMMUNITY): Payer: Medicare HMO

## 2014-04-24 ENCOUNTER — Inpatient Hospital Stay (HOSPITAL_COMMUNITY)
Admission: EM | Admit: 2014-04-24 | Discharge: 2014-04-26 | DRG: 392 | Disposition: A | Discharge status: Home / Self Care | Payer: Medicare HMO | Attending: Internal Medicine | Admitting: Internal Medicine

## 2014-04-24 DIAGNOSIS — R809 Proteinuria, unspecified: Secondary | ICD-10-CM

## 2014-04-24 DIAGNOSIS — D649 Anemia, unspecified: Secondary | ICD-10-CM | POA: Diagnosis present

## 2014-04-24 DIAGNOSIS — Z794 Long term (current) use of insulin: Secondary | ICD-10-CM | POA: Diagnosis not present

## 2014-04-24 DIAGNOSIS — Z881 Allergy status to other antibiotic agents status: Secondary | ICD-10-CM

## 2014-04-24 DIAGNOSIS — E871 Hypo-osmolality and hyponatremia: Secondary | ICD-10-CM | POA: Diagnosis present

## 2014-04-24 DIAGNOSIS — H409 Unspecified glaucoma: Secondary | ICD-10-CM | POA: Diagnosis present

## 2014-04-24 DIAGNOSIS — E861 Hypovolemia: Secondary | ICD-10-CM | POA: Diagnosis present

## 2014-04-24 DIAGNOSIS — I959 Hypotension, unspecified: Secondary | ICD-10-CM | POA: Diagnosis present

## 2014-04-24 DIAGNOSIS — K529 Noninfective gastroenteritis and colitis, unspecified: Secondary | ICD-10-CM

## 2014-04-24 DIAGNOSIS — E119 Type 2 diabetes mellitus without complications: Secondary | ICD-10-CM | POA: Diagnosis present

## 2014-04-24 DIAGNOSIS — R55 Syncope and collapse: Secondary | ICD-10-CM

## 2014-04-24 DIAGNOSIS — N179 Acute kidney failure, unspecified: Secondary | ICD-10-CM | POA: Diagnosis present

## 2014-04-24 DIAGNOSIS — E872 Acidosis: Secondary | ICD-10-CM | POA: Diagnosis present

## 2014-04-24 DIAGNOSIS — I10 Essential (primary) hypertension: Secondary | ICD-10-CM | POA: Diagnosis present

## 2014-04-24 DIAGNOSIS — A09 Infectious gastroenteritis and colitis, unspecified: Principal | ICD-10-CM | POA: Diagnosis present

## 2014-04-24 DIAGNOSIS — R112 Nausea with vomiting, unspecified: Secondary | ICD-10-CM | POA: Diagnosis not present

## 2014-04-24 DIAGNOSIS — E8729 Other acidosis: Secondary | ICD-10-CM | POA: Insufficient documentation

## 2014-04-24 DIAGNOSIS — I9589 Other hypotension: Secondary | ICD-10-CM

## 2014-04-24 DIAGNOSIS — IMO0001 Reserved for inherently not codable concepts without codable children: Secondary | ICD-10-CM

## 2014-04-24 LAB — COMPREHENSIVE METABOLIC PANEL
ALBUMIN: 3.8 g/dL (ref 3.5–5.2)
ALT: 16 U/L (ref 0–35)
AST: 34 U/L (ref 0–37)
Alkaline Phosphatase: 65 U/L (ref 39–117)
Anion gap: 10 (ref 5–15)
BILIRUBIN TOTAL: 1.6 mg/dL — AB (ref 0.3–1.2)
BUN: 30 mg/dL — ABNORMAL HIGH (ref 6–23)
CO2: 17 mmol/L — ABNORMAL LOW (ref 19–32)
Calcium: 8.7 mg/dL (ref 8.4–10.5)
Chloride: 104 mEq/L (ref 96–112)
Creatinine, Ser: 1.43 mg/dL — ABNORMAL HIGH (ref 0.50–1.10)
GFR calc Af Amer: 41 mL/min — ABNORMAL LOW (ref >90)
GFR, EST NON AFRICAN AMERICAN: 35 mL/min — AB (ref >90)
Glucose, Bld: 203 mg/dL — ABNORMAL HIGH (ref 70–99)
Potassium: 4 mmol/L (ref 3.5–5.1)
SODIUM: 131 mmol/L — AB (ref 135–145)
TOTAL PROTEIN: 6.5 g/dL (ref 6.0–8.3)

## 2014-04-24 LAB — GLUCOSE, CAPILLARY
Glucose-Capillary: 115 mg/dL — ABNORMAL HIGH (ref 70–99)
Glucose-Capillary: 146 mg/dL — ABNORMAL HIGH (ref 70–99)

## 2014-04-24 LAB — CBC WITH DIFFERENTIAL/PLATELET
BASOS ABS: 0 10*3/uL (ref 0.0–0.1)
Basophils Relative: 0 % (ref 0–1)
EOS PCT: 1 % (ref 0–5)
Eosinophils Absolute: 0.1 10*3/uL (ref 0.0–0.7)
HEMATOCRIT: 33.2 % — AB (ref 36.0–46.0)
Hemoglobin: 11.4 g/dL — ABNORMAL LOW (ref 12.0–15.0)
Lymphocytes Relative: 17 % (ref 12–46)
Lymphs Abs: 1.5 10*3/uL (ref 0.7–4.0)
MCH: 31.9 pg (ref 26.0–34.0)
MCHC: 34.3 g/dL (ref 30.0–36.0)
MCV: 93 fL (ref 78.0–100.0)
MONOS PCT: 9 % (ref 3–12)
Monocytes Absolute: 0.8 10*3/uL (ref 0.1–1.0)
NEUTROS ABS: 6.6 10*3/uL (ref 1.7–7.7)
Neutrophils Relative %: 73 % (ref 43–77)
PLATELETS: 233 10*3/uL (ref 150–400)
RBC: 3.57 MIL/uL — ABNORMAL LOW (ref 3.87–5.11)
RDW: 13.6 % (ref 11.5–15.5)
WBC: 9 10*3/uL (ref 4.0–10.5)

## 2014-04-24 LAB — HEMOGLOBIN A1C
Hgb A1c MFr Bld: 6 % — ABNORMAL HIGH (ref <5.7)
MEAN PLASMA GLUCOSE: 126 mg/dL — AB (ref <117)

## 2014-04-24 LAB — URINALYSIS, ROUTINE W REFLEX MICROSCOPIC
Bilirubin Urine: NEGATIVE
Glucose, UA: NEGATIVE mg/dL
HGB URINE DIPSTICK: NEGATIVE
Ketones, ur: NEGATIVE mg/dL
Leukocytes, UA: NEGATIVE
NITRITE: NEGATIVE
PH: 6 (ref 5.0–8.0)
Protein, ur: NEGATIVE mg/dL
Specific Gravity, Urine: 1.012 (ref 1.005–1.030)
Urobilinogen, UA: 0.2 mg/dL (ref 0.0–1.0)

## 2014-04-24 LAB — CBG MONITORING, ED: Glucose-Capillary: 173 mg/dL — ABNORMAL HIGH (ref 70–99)

## 2014-04-24 LAB — POC OCCULT BLOOD, ED: Fecal Occult Bld: NEGATIVE

## 2014-04-24 LAB — TROPONIN I: Troponin I: 0.04 ng/mL — ABNORMAL HIGH (ref <0.031)

## 2014-04-24 LAB — I-STAT CG4 LACTIC ACID, ED
LACTIC ACID, VENOUS: 2.57 mmol/L — AB (ref 0.5–2.2)
Lactic Acid, Venous: 1.83 mmol/L (ref 0.5–2.2)

## 2014-04-24 IMAGING — CT CT ABD-PELV W/ CM
1 of 3 series · 12 of 32 positions shown, 17 images · IV contrast (OMNIPAQUE 300)
Comparison: No priors.

CLINICAL DATA: 73-year-old female with nausea, vomiting and
diarrhea this morning.

EXAM:
CT ABDOMEN AND PELVIS WITH CONTRAST
TECHNIQUE: Multidetector CT imaging of the abdomen and pelvis was performed
using the standard protocol following bolus administration of
intravenous contrast.
CONTRAST:  50mL OMNIPAQUE IOHEXOL 300 MG/ML SOLN, 80mL OMNIPAQUE
IOHEXOL 300 MG/ML SOLN

[Series 2: abd/pel with · axial · 0.92mm/px · z∈[-322,+58]mm · 12 of 86 slices shown, 17 images]
[im 5/86  soft-tissue]
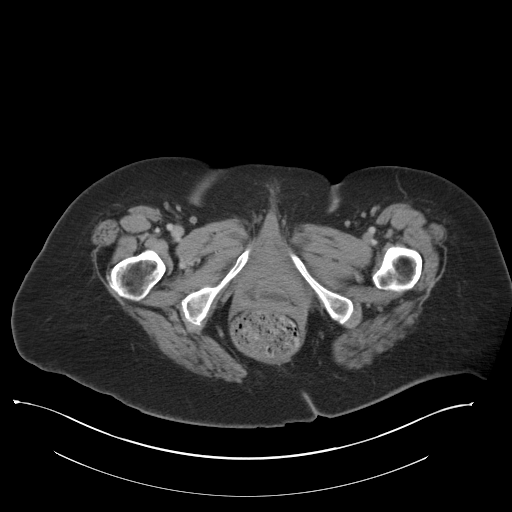
[im 5/86  bone]
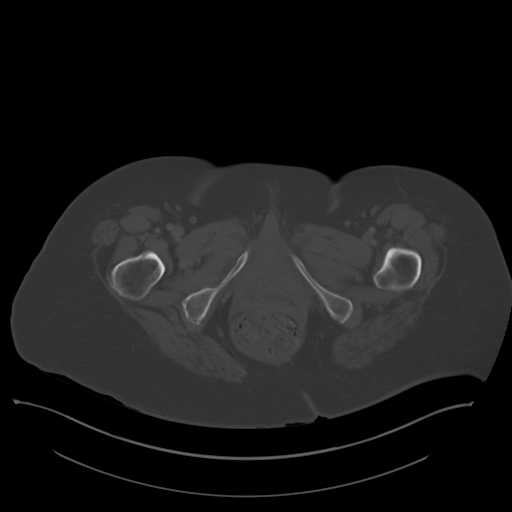
[im 15/86  soft-tissue]
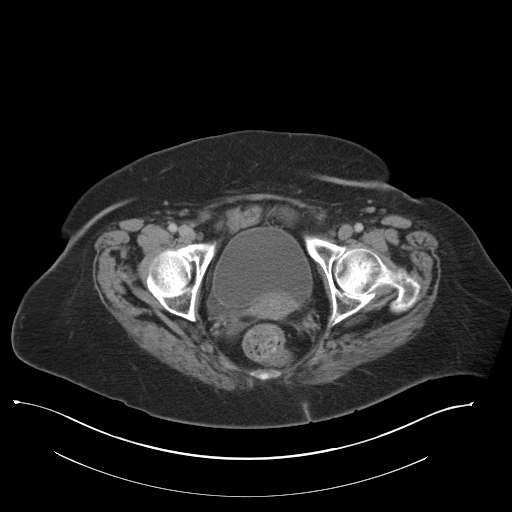
[im 19/86  soft-tissue]
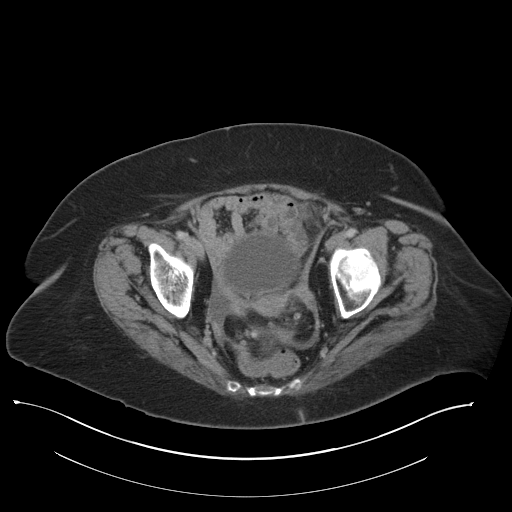
[im 29/86  soft-tissue]
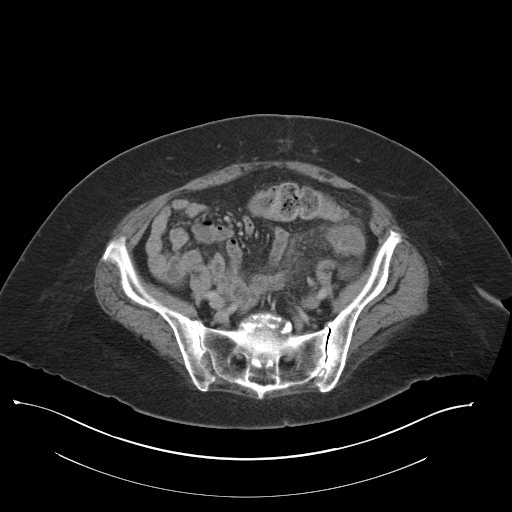
[im 34/86  soft-tissue]
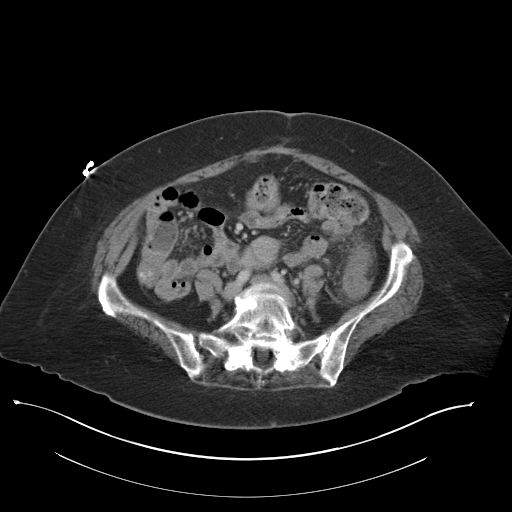
[im 43/86  soft-tissue]
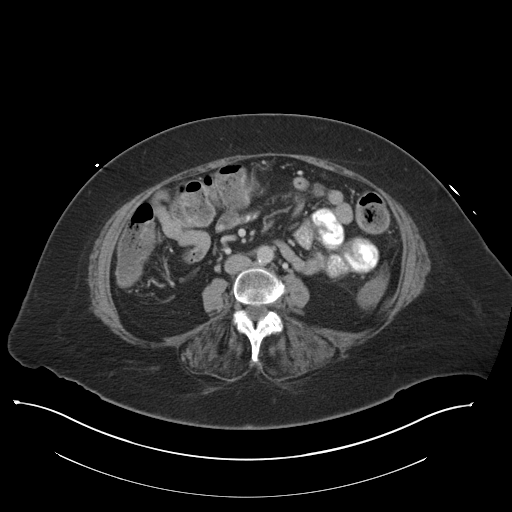
[im 52/86  soft-tissue]
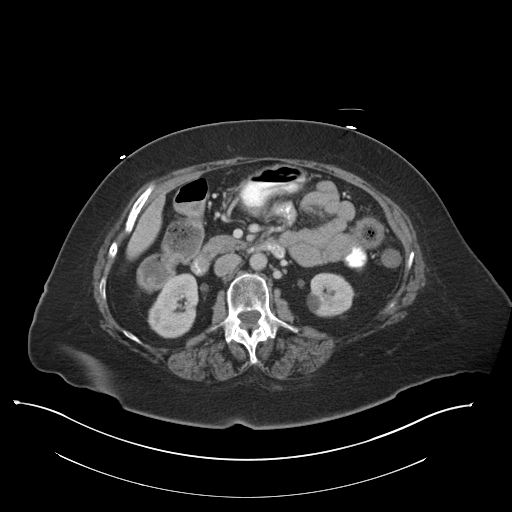
[im 57/86  soft-tissue]
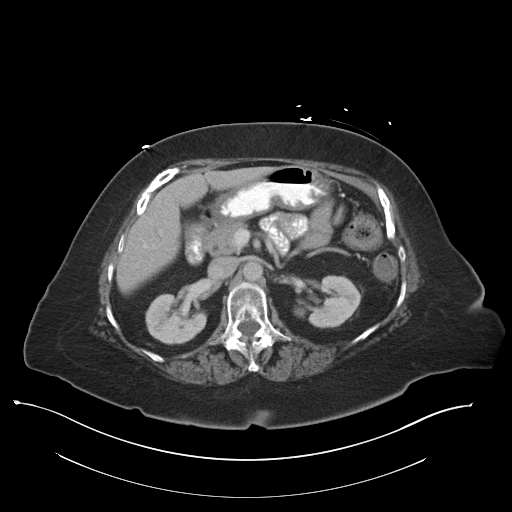
[im 67/86  soft-tissue]
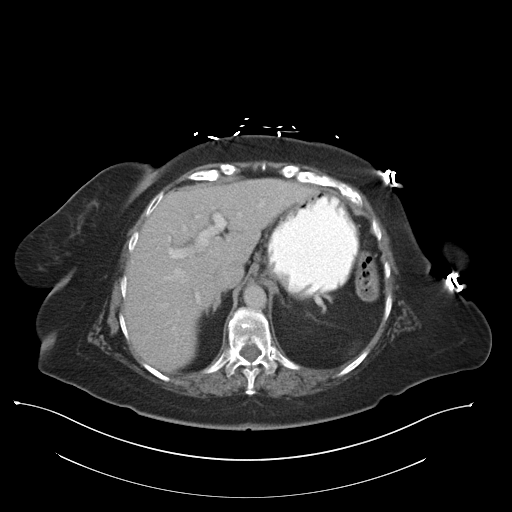
[im 67/86  lung]
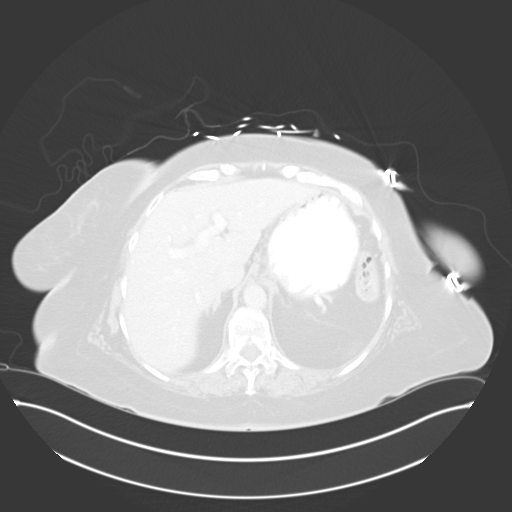
[im 67/86  bone]
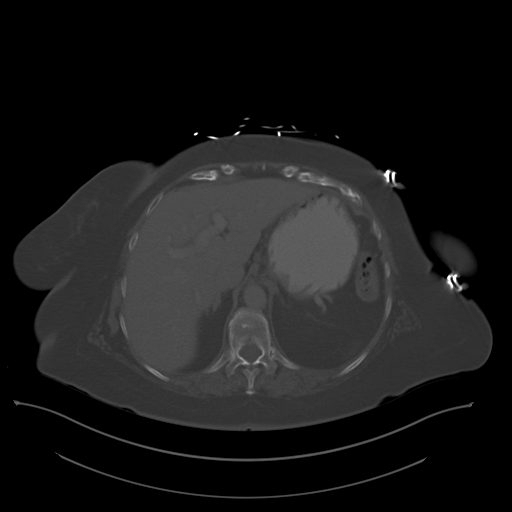
[im 71/86  soft-tissue]
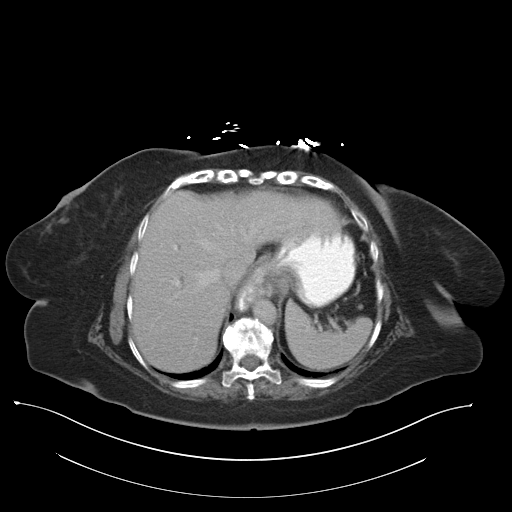
[im 71/86  lung]
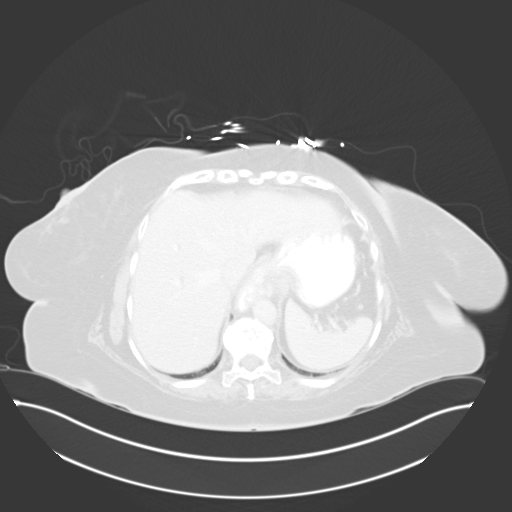
[im 76/86  lung]
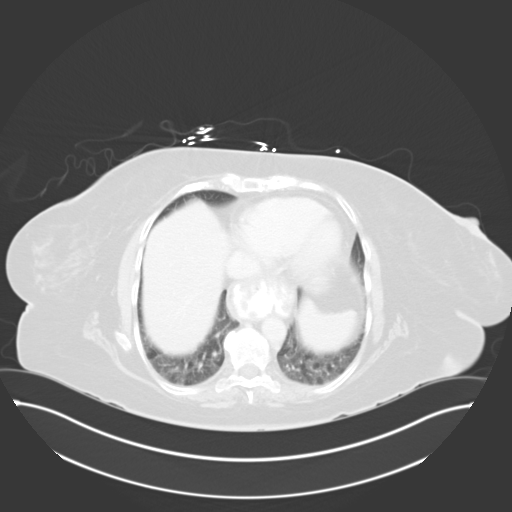
[im 81/86  soft-tissue]
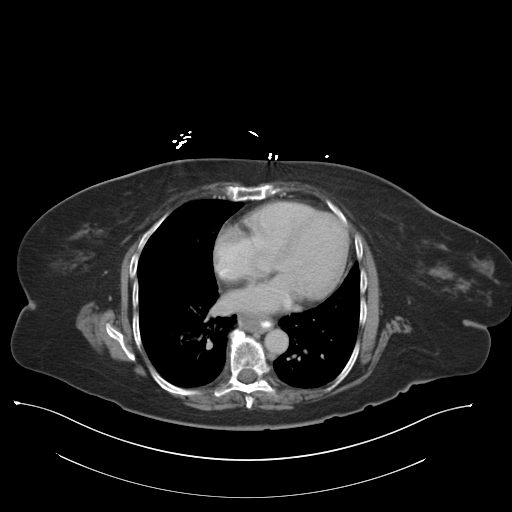
[im 81/86  lung]
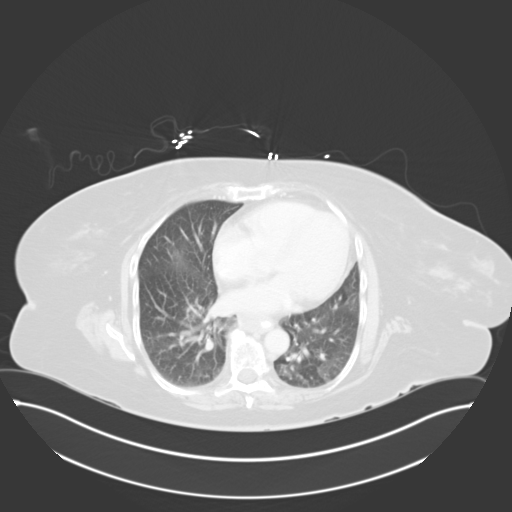

[12 of 32 positions shown; findings below may reference images not displayed]

FINDINGS: Lower chest: Mosaic attenuation throughout the visualize lung bases
suggesting air trapping. Mild cardiomegaly. Large hiatal hernia.

Hepatobiliary: No cystic or solid hepatic lesions. Status post
cholecystectomy. Very mild intrahepatic biliary ductal dilatation.
Common bile duct measures 7 mm in the porta hepatis.

Pancreas: Unremarkable.

Spleen: Unremarkable.

Adrenals/Urinary Tract: Several sub cm low-attenuation lesions in
the kidneys bilaterally are too small to definitively characterize,
but are favored to represent small cysts. Larger low-attenuation
lesions in both kidneys are compatible with simple cysts, with the
largest of these measuring up to 1.8 cm in the medial aspect of the
lower pole the left kidney. No suspicious renal lesions are noted.
No hydroureteronephrosis. Urinary bladder extends well below the
level of the pubococcygeal line, compatible with a cystocele.
Bilateral adrenal glands are normal in appearance.

Stomach/Bowel: The intra-abdominal portion of the stomach is normal.
No pathologic dilatation of small bowel or colon. Extensive colonic
wall thickening is noted, predominantly in the distal descending
colon and proximal sigmoid colon, where there is extensive
surrounding inflammatory changes, suggestive of an acute colitis.
Several colonic diverticulae are also noted in the region of the
sigmoid colon, such that acute diverticulitis is not excluded,
however, the inflammatory changes and bowel wall thickening is
rather diffuse (more so than typically seen in the setting of acute
diverticulitis). Normal appendix. Downward displacement of the
distal rectum well below the pubococcygeal line, with impression
upon the posterior wall of the vagina, compatible with a rectocele.

Vascular/Lymphatic: Atherosclerosis throughout the abdominal and
pelvic vasculature, without evidence of aneurysm or dissection.
Circumaortic left renal vein (normal anatomical variant)
incidentally noted. No lymphadenopathy identified in the abdomen or
pelvis.

Reproductive: Uterus and ovaries are unremarkable in appearance.
Downward displacement of the vaginal apex below the pubococcygeal
line, compatible with vaginal vault prolapse.

Other: Small volume of ascites.  No pneumoperitoneum.

Musculoskeletal: Laxity of the levator ani musculature, particularly
on the right side where there is outward bowing. 6 mm of
anterolisthesis of L5 upon S1. There are no aggressive appearing
lytic or blastic lesions noted in the visualized portions of the
skeleton.
IMPRESSION: 1. Findings, as above, most favored to reflect severe colitis of the
descending colon and proximal sigmoid colon. There is extensive
diverticular disease of the sigmoid colon such that acute
diverticulitis is not excluded, however, the extent of bowel wall
thickening and inflammatory changes is much greater and last
localized than typically seen with a more focal diverticulitis.
2. Small volume of reactive ascites in the anatomic pelvis.
3. Severe laxity of the levator ani musculature (particularly on the
right side), with evidence of severe pelvic floor prolapse,
including cystocele, vaginal vault prolapse, and a large rectocele,
as discussed above.
4. Mosaic attenuation lung bases bilaterally, indicative of air
trapping from small airway disease.
5. Mild intra and extrahepatic biliary ductal dilatation, strongly
favored to be physiologic in this post cholecystectomy patient.
6. Large hiatal hernia.
7. Additional incidental findings, as above.

## 2014-04-24 IMAGING — CR DG CHEST 2V
2 series · 2 of 2 positions shown · non-contrast
Comparison: [DATE]

CLINICAL DATA: Dizziness and vomiting ; weakness

EXAM:
CHEST  2 VIEW

[w chest lat]
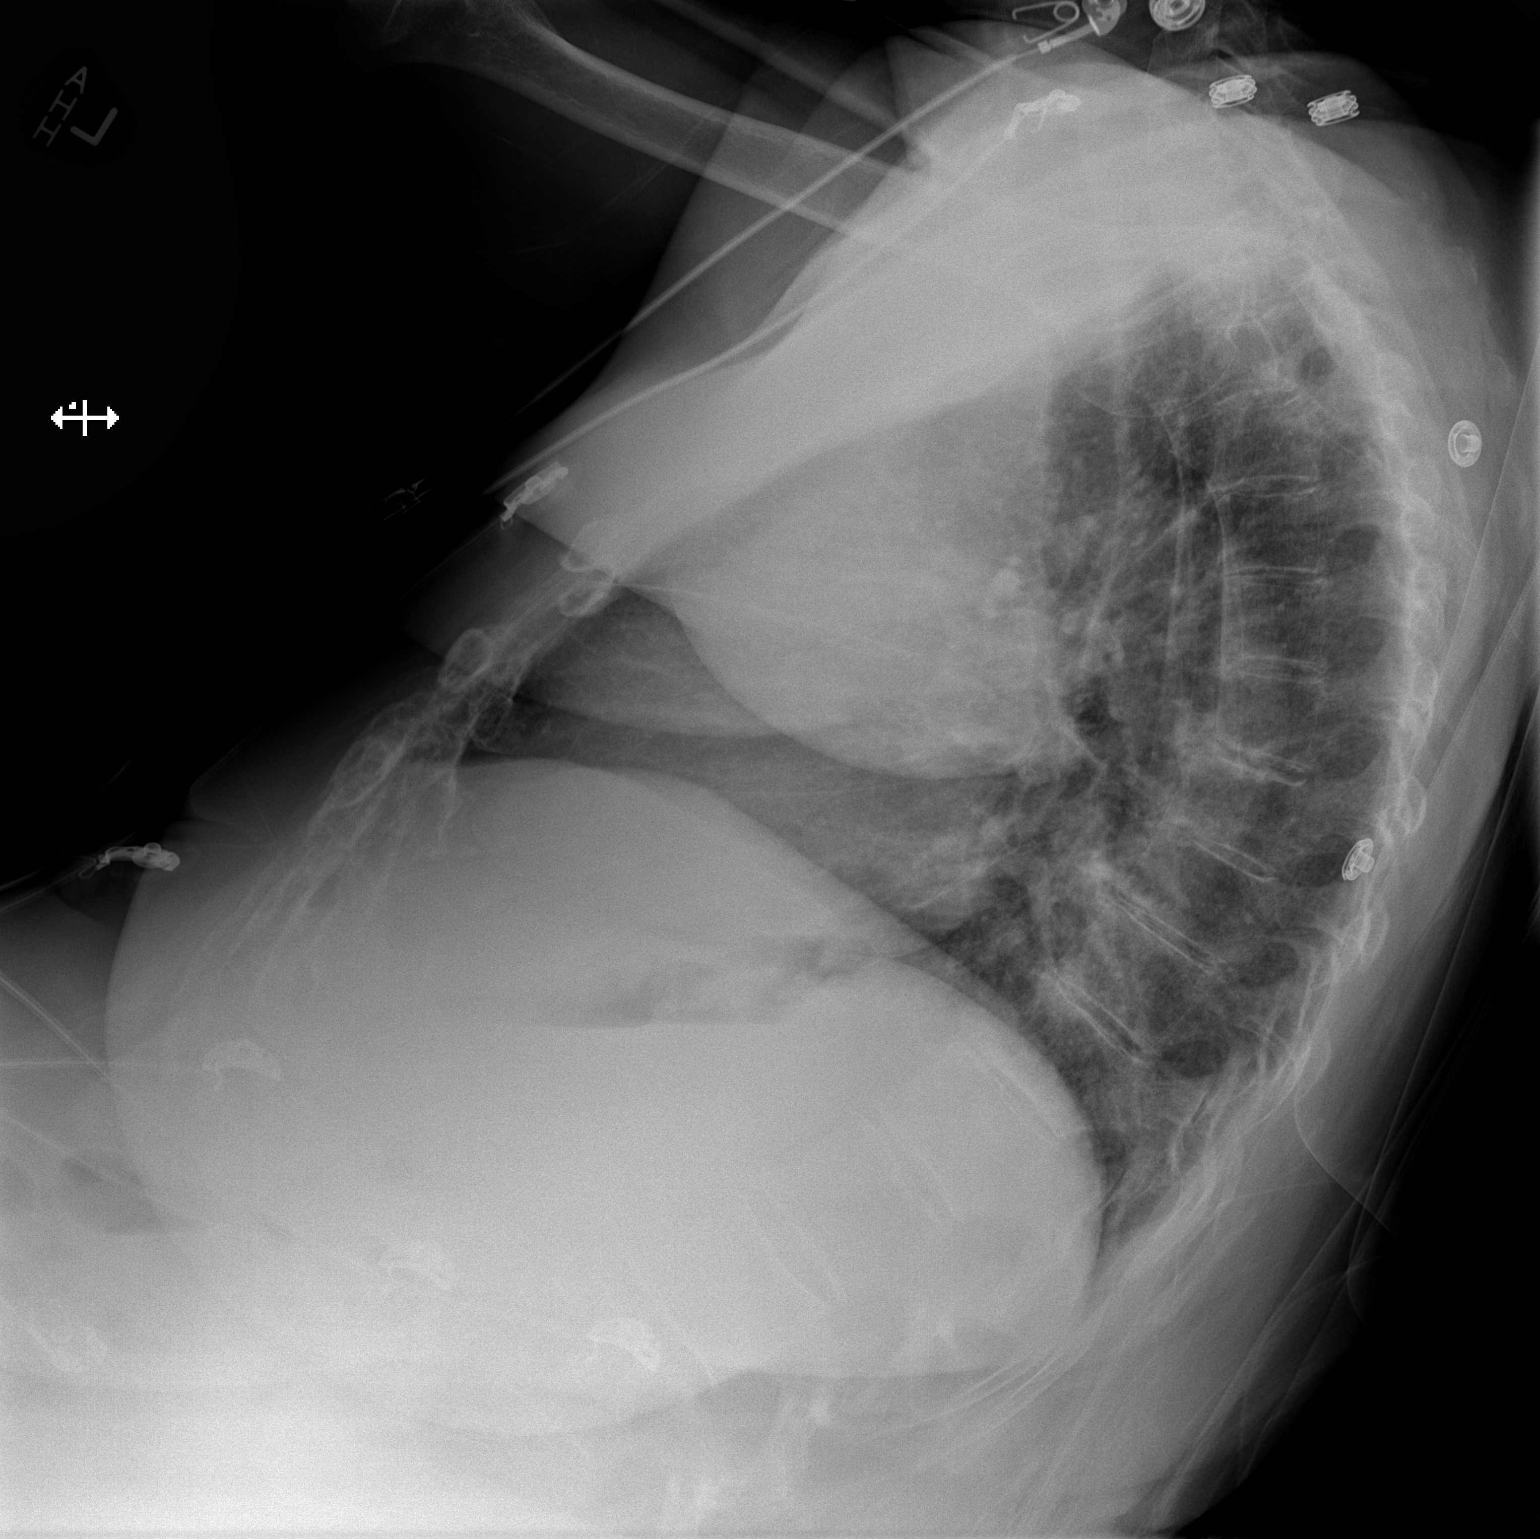

[x chest ap]
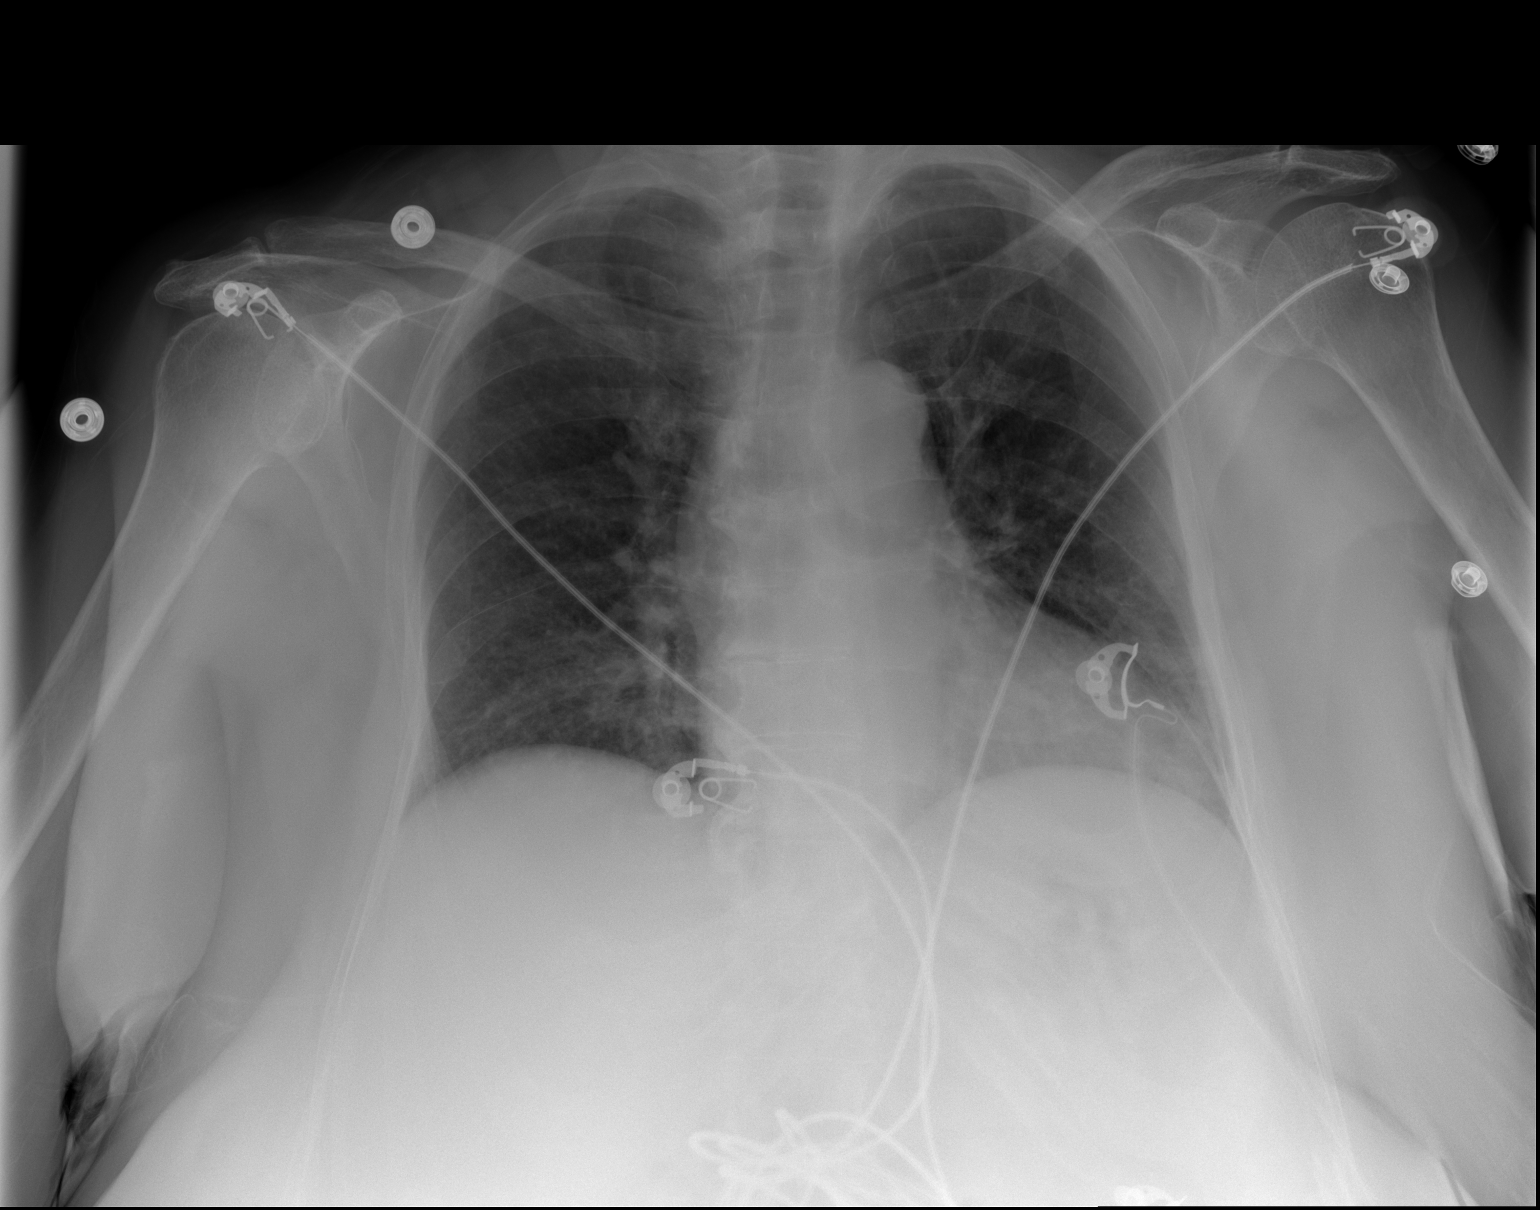

[2 of 2 positions shown; findings below may reference images not displayed]

FINDINGS: The There is no There is no edema or consolidation. The heart size
and pulmonary vascularity are within normal limits. No adenopathy.
There is degenerative change in the thoracic spine.
IMPRESSION: No edema or consolidation.

## 2014-04-24 MED ORDER — SODIUM CHLORIDE 0.9 % IV SOLN
INTRAVENOUS | Status: AC
  Administered 2014-04-24: 14:00:00 via INTRAVENOUS

## 2014-04-24 MED ORDER — HEPARIN SODIUM (PORCINE) 5000 UNIT/ML IJ SOLN
5000.0000 [IU] | Freq: Three times a day (TID) | INTRAMUSCULAR | Status: DC
  Administered 2014-04-24 – 2014-04-26 (×6): 5000 [IU] via SUBCUTANEOUS
  Filled 2014-04-24 (×9): qty 1

## 2014-04-24 MED ORDER — SODIUM CHLORIDE 0.9 % IV BOLUS (SEPSIS)
1000.0000 mL | Freq: Once | INTRAVENOUS | Status: AC
  Administered 2014-04-24: 1000 mL via INTRAVENOUS

## 2014-04-24 MED ORDER — INSULIN ASPART 100 UNIT/ML ~~LOC~~ SOLN: 0.0000 [IU] | Freq: Three times a day (TID) | SUBCUTANEOUS | Status: DC

## 2014-04-24 MED ORDER — METRONIDAZOLE IN NACL 5-0.79 MG/ML-% IV SOLN
500.0000 mg | Freq: Once | INTRAVENOUS | Status: AC
  Administered 2014-04-24: 500 mg via INTRAVENOUS
  Filled 2014-04-24: qty 100

## 2014-04-24 MED ORDER — HYDROCODONE-ACETAMINOPHEN 5-325 MG PO TABS: 1.0000 | ORAL_TABLET | ORAL | Status: DC | PRN

## 2014-04-24 MED ORDER — CIPROFLOXACIN IN D5W 400 MG/200ML IV SOLN
400.0000 mg | Freq: Once | INTRAVENOUS | Status: DC
  Filled 2014-04-24: qty 200

## 2014-04-24 MED ORDER — PANTOPRAZOLE SODIUM 40 MG PO TBEC
40.0000 mg | DELAYED_RELEASE_TABLET | Freq: Every day | ORAL | Status: DC
  Administered 2014-04-24 – 2014-04-26 (×3): 40 mg via ORAL
  Filled 2014-04-24 (×3): qty 1

## 2014-04-24 MED ORDER — FENTANYL CITRATE 0.05 MG/ML IJ SOLN
50.0000 ug | Freq: Once | INTRAMUSCULAR | Status: AC
  Administered 2014-04-24: 50 ug via INTRAVENOUS
  Filled 2014-04-24: qty 2

## 2014-04-24 MED ORDER — ACETAMINOPHEN 325 MG PO TABS
650.0000 mg | ORAL_TABLET | Freq: Four times a day (QID) | ORAL | Status: DC | PRN
  Administered 2014-04-25: 650 mg via ORAL
  Filled 2014-04-24: qty 2

## 2014-04-24 MED ORDER — IOHEXOL 300 MG/ML  SOLN
80.0000 mL | Freq: Once | INTRAMUSCULAR | Status: AC | PRN
  Administered 2014-04-24: 80 mL via INTRAVENOUS

## 2014-04-24 MED ORDER — IOHEXOL 300 MG/ML  SOLN
50.0000 mL | Freq: Once | INTRAMUSCULAR | Status: AC | PRN
  Administered 2014-04-24: 50 mL via ORAL

## 2014-04-24 MED ORDER — ALENDRONATE SODIUM 70 MG PO TABS: 70.0000 mg | ORAL_TABLET | ORAL | Status: DC

## 2014-04-24 MED ORDER — ONDANSETRON HCL 4 MG PO TABS: 4.0000 mg | ORAL_TABLET | Freq: Four times a day (QID) | ORAL | Status: DC | PRN

## 2014-04-24 MED ORDER — INSULIN ASPART 100 UNIT/ML ~~LOC~~ SOLN: 0.0000 [IU] | Freq: Every day | SUBCUTANEOUS | Status: DC

## 2014-04-24 MED ORDER — ONDANSETRON HCL 4 MG/2ML IJ SOLN: 4.0000 mg | Freq: Three times a day (TID) | INTRAMUSCULAR | Status: DC | PRN

## 2014-04-24 MED ORDER — INSULIN NPH (HUMAN) (ISOPHANE) 100 UNIT/ML ~~LOC~~ SUSP
15.0000 [IU] | Freq: Two times a day (BID) | SUBCUTANEOUS | Status: DC
  Administered 2014-04-24 – 2014-04-26 (×3): 15 [IU] via SUBCUTANEOUS
  Filled 2014-04-24: qty 10

## 2014-04-24 MED ORDER — LEVOFLOXACIN IN D5W 500 MG/100ML IV SOLN
500.0000 mg | INTRAVENOUS | Status: DC
  Administered 2014-04-24: 500 mg via INTRAVENOUS
  Filled 2014-04-24 (×2): qty 100

## 2014-04-24 MED ORDER — METRONIDAZOLE IN NACL 5-0.79 MG/ML-% IV SOLN
500.0000 mg | Freq: Three times a day (TID) | INTRAVENOUS | Status: DC
  Administered 2014-04-24 – 2014-04-25 (×2): 500 mg via INTRAVENOUS
  Filled 2014-04-24 (×3): qty 100

## 2014-04-24 MED ORDER — ACETAMINOPHEN 650 MG RE SUPP: 650.0000 mg | Freq: Four times a day (QID) | RECTAL | Status: DC | PRN

## 2014-04-24 MED ORDER — LIP MEDEX EX OINT
TOPICAL_OINTMENT | CUTANEOUS | Status: DC | PRN
  Administered 2014-04-24: 23:00:00 via TOPICAL
  Filled 2014-04-24: qty 7

## 2014-04-24 MED ORDER — INSULIN ASPART 100 UNIT/ML ~~LOC~~ SOLN: 3.0000 [IU] | Freq: Three times a day (TID) | SUBCUTANEOUS | Status: DC

## 2014-04-24 MED ORDER — ONDANSETRON HCL 4 MG/2ML IJ SOLN: 4.0000 mg | Freq: Four times a day (QID) | INTRAMUSCULAR | Status: DC | PRN

## 2014-04-24 MED ORDER — SODIUM CHLORIDE 0.9 % IV SOLN
Freq: Once | INTRAVENOUS | Status: AC
  Administered 2014-04-24: 11:00:00 via INTRAVENOUS

## 2014-04-24 MED ORDER — SODIUM CHLORIDE 0.9 % IV SOLN
Freq: Once | INTRAVENOUS | Status: AC
  Administered 2014-04-24: 12:00:00 via INTRAVENOUS

## 2014-04-24 NOTE — ED Notes (Signed)
MD at bedside. 

## 2014-04-24 NOTE — ED Notes (Signed)
Bed: [id number] Expected date: 04/24/14 Expected time: 7:40 AM Means of arrival: Ambulance Comments: N/V/D

## 2014-04-24 NOTE — ED Notes (Addendum)
Per ems pt from home. Hx of DM, reprots starting having nausea, vomiting, and diarrhea this morning. Felt dizzy. Vomited when ems arrived. Hypotensive with ems. 20g L arm, 4 mg zofran given, 250ml NS given.   Upon arrival, pt cold and clammy. Alert and oriented x4. Reporting urge to have bowel movement. Denies pain. Pt does not have cancer, just short hair[facility]

## 2014-04-24 NOTE — ED Provider Notes (Addendum)
CSN: [id number]     Arrival date & time 04/24/14  4098 History   First MD Initiated Contact with Patient 04/24/14 0759     Chief Complaint  Patient presents with  . Hypotension  . Emesis     (Consider location/radiation/quality/duration/timing/severity/associated sxs/prior Treatment) HPI Comments: Patient presents with nausea vomiting diarrhea. She has a history diabetes mellitus. She states this morning she got up and was giving herself a shot insulin and she had a sudden onset of nausea and vomiting. She vomited 3 times. Her emesis was nonbloody and nonbilious. She then had an episode of watery diarrhea. She felt dizzy and lightheaded and felt like she might pass out. She called EMS and was found to be hypotensive with a blood pressure in the 80s at that point. She denies any chest pain or shortness of breath. She denies any recent illnesses other than she had a cough and cold about a week ago but feels like it's improved. She denies any recent fevers or chills. She denies any urinary symptoms other than she has some urinary frequency and urgency which is common for her. She did not have any fall or injury during this episode. She denies any numbness or weakness to her extremities. She denies any vision changes or speech deficits.  Patient is a 74 y.o. female presenting with vomiting.  Emesis Associated symptoms: diarrhea   Associated symptoms: no abdominal pain, no arthralgias, no chills and no headaches     Past Medical History  Diagnosis Date  . Diabetes mellitus without complication    Past Surgical History  Procedure Laterality Date  . Hernia repair    . Tonsillectomy     History reviewed. No pertinent family history. History  Substance Use Topics  . Smoking status: Never Smoker   . Smokeless tobacco: Not on file  . Alcohol Use: No   OB History    No data available     Review of Systems  Constitutional: Positive for fatigue. Negative for fever, chills and diaphoresis.   HENT: Negative for congestion, rhinorrhea and sneezing.   Eyes: Negative.   Respiratory: Positive for cough. Negative for chest tightness and shortness of breath.   Cardiovascular: Negative for chest pain and leg swelling.  Gastrointestinal: Positive for nausea, vomiting and diarrhea. Negative for abdominal pain and blood in stool.  Genitourinary: Positive for urgency and frequency. Negative for hematuria, flank pain and difficulty urinating.  Musculoskeletal: Negative for back pain and arthralgias.  Skin: Negative for rash.  Neurological: Positive for light-headedness. Negative for dizziness, speech difficulty, weakness, numbness and headaches.      Allergies  Review of patient's allergies indicates no known allergies.  Home Medications   Prior to Admission medications   Medication Sig Start Date End Date Taking? Authorizing Provider  alendronate (FOSAMAX) 70 MG tablet Take 70 mg by mouth once a week.  03/24/14  Yes Historical Provider, MD  beta carotene w/minerals (OCUVITE) tablet Take 1 tablet by mouth daily.   Yes Historical Provider, MD  ibuprofen (ADVIL,MOTRIN) 800 MG tablet Take 800 mg by mouth every 8 (eight) hours as needed for mild pain.   Yes Historical Provider, MD  insulin NPH Human (HUMULIN N,NOVOLIN N) 100 UNIT/ML injection Inject 22 Units into the skin 2 (two) times daily before a meal.    Yes Historical Provider, MD  lisinopril-hydrochlorothiazide (PRINZIDE,ZESTORETIC) 20-12.5 MG per tablet Take 1 tablet by mouth daily.  03/15/14  Yes Historical Provider, MD  metFORMIN (GLUCOPHAGE) 500 MG tablet Take 500 mg  by mouth 2 (two) times daily with a meal.   Yes Historical Provider, MD  omeprazole (PRILOSEC) 40 MG capsule Take 40 mg by mouth daily.   Yes Historical Provider, MD  trimethoprim (TRIMPEX) 100 MG tablet Take 100 mg by mouth daily.   Yes Historical Provider, MD   BP 100/49 mmHg  Pulse 70  Temp(Src) 97.5 F (36.4 C) (Rectal)  Resp 16  SpO2 100% Physical Exam   Constitutional: She is oriented to person, place, and time. She appears well-developed and well-nourished.  HENT:  Head: Normocephalic and atraumatic.  Mouth/Throat: Oropharynx is clear and moist.  Eyes: Pupils are equal, round, and reactive to light.  Neck: Normal range of motion. Neck supple.  Cardiovascular: Normal rate, regular rhythm and normal heart sounds.   Pulmonary/Chest: Effort normal and breath sounds normal. No respiratory distress. She has no wheezes. She has no rales. She exhibits no tenderness.  Abdominal: Soft. Bowel sounds are normal. There is no tenderness. There is no rebound and no guarding.  Musculoskeletal: Normal range of motion. She exhibits no edema.  Lymphadenopathy:    She has no cervical adenopathy.  Neurological: She is alert and oriented to person, place, and time. She has normal strength. No cranial nerve deficit or sensory deficit. GCS eye subscore is 4. GCS verbal subscore is 5. GCS motor subscore is 6.  No pronator drift, finger-to-nose intact  Skin: Skin is warm and dry. No rash noted.  Psychiatric: She has a normal mood and affect.    ED Course  Procedures (including critical care time) Labs Review Labs Reviewed  COMPREHENSIVE METABOLIC PANEL - Abnormal; Notable for the following:    Sodium 131 (*)    CO2 17 (*)    Glucose, Bld 203 (*)    BUN 30 (*)    Creatinine, Ser 1.43 (*)    Total Bilirubin 1.6 (*)    GFR calc non Af Amer 35 (*)    GFR calc Af Amer 41 (*)    All other components within normal limits  CBC WITH DIFFERENTIAL - Abnormal; Notable for the following:    RBC 3.57 (*)    Hemoglobin 11.4 (*)    HCT 33.2 (*)    All other components within normal limits  TROPONIN I - Abnormal; Notable for the following:    Troponin I 0.04 (*)    All other components within normal limits  CBG MONITORING, ED - Abnormal; Notable for the following:    Glucose-Capillary 173 (*)    All other components within normal limits  I-STAT CG4 LACTIC ACID,  ED - Abnormal; Notable for the following:    Lactic Acid, Venous 2.57 (*)    All other components within normal limits  URINALYSIS, ROUTINE W REFLEX MICROSCOPIC  POC OCCULT BLOOD, ED  I-STAT CG4 LACTIC ACID, ED    Imaging Review Dg Chest 2 View  04/24/2014   CLINICAL DATA:  Dizziness and vomiting ; weakness  EXAM: CHEST  2 VIEW  COMPARISON:  March 25, 2010  FINDINGS: The There is no There is no edema or consolidation. The heart size and pulmonary vascularity are within normal limits. No adenopathy. There is degenerative change in the thoracic spine.  IMPRESSION: No edema or consolidation.   Electronically Signed   By: Bretta BangWilliam  Woodruff M.D.   On: 04/24/2014 08:38   Ct Abdomen Pelvis W Contrast  04/24/2014   CLINICAL DATA:  74 year old female with nausea, vomiting and diarrhea this morning.  EXAM: CT ABDOMEN AND PELVIS WITH  CONTRAST  TECHNIQUE: Multidetector CT imaging of the abdomen and pelvis was performed using the standard protocol following bolus administration of intravenous contrast.  CONTRAST:  50mL OMNIPAQUE IOHEXOL 300 MG/ML SOLN, 80mL OMNIPAQUE IOHEXOL 300 MG/ML SOLN  COMPARISON:  No priors.  FINDINGS: Lower chest: Mosaic attenuation throughout the visualize lung bases suggesting air trapping. Mild cardiomegaly. Large hiatal hernia.  Hepatobiliary: No cystic or solid hepatic lesions. Status post cholecystectomy. Very mild intrahepatic biliary ductal dilatation. Common bile duct measures 7 mm in the porta hepatis.  Pancreas: Unremarkable.  Spleen: Unremarkable.  Adrenals/Urinary Tract: Several sub cm low-attenuation lesions in the kidneys bilaterally are too small to definitively characterize, but are favored to represent small cysts. Larger low-attenuation lesions in both kidneys are compatible with simple cysts, with the largest of these measuring up to 1.8 cm in the medial aspect of the lower pole the left kidney. No suspicious renal lesions are noted. No hydroureteronephrosis. Urinary  bladder extends well below the level of the pubococcygeal line, compatible with a cystocele. Bilateral adrenal glands are normal in appearance.  Stomach/Bowel: The intra-abdominal portion of the stomach is normal. No pathologic dilatation of small bowel or colon. Extensive colonic wall thickening is noted, predominantly in the distal descending colon and proximal sigmoid colon, where there is extensive surrounding inflammatory changes, suggestive of an acute colitis. Several colonic diverticulae are also noted in the region of the sigmoid colon, such that acute diverticulitis is not excluded, however, the inflammatory changes and bowel wall thickening is rather diffuse (more so than typically seen in the setting of acute diverticulitis). Normal appendix. Downward displacement of the distal rectum well below the pubococcygeal line, with impression upon the posterior wall of the vagina, compatible with a rectocele.  Vascular/Lymphatic: Atherosclerosis throughout the abdominal and pelvic vasculature, without evidence of aneurysm or dissection. Circumaortic left renal vein (normal anatomical variant) incidentally noted. No lymphadenopathy identified in the abdomen or pelvis.  Reproductive: Uterus and ovaries are unremarkable in appearance. Downward displacement of the vaginal apex below the pubococcygeal line, compatible with vaginal vault prolapse.  Other: Small volume of ascites.  No pneumoperitoneum.  Musculoskeletal: Laxity of the levator ani musculature, particularly on the right side where there is outward bowing. 6 mm of anterolisthesis of L5 upon S1. There are no aggressive appearing lytic or blastic lesions noted in the visualized portions of the skeleton.  IMPRESSION: 1. Findings, as above, most favored to reflect severe colitis of the descending colon and proximal sigmoid colon. There is extensive diverticular disease of the sigmoid colon such that acute diverticulitis is not excluded, however, the extent of  bowel wall thickening and inflammatory changes is much greater and last localized than typically seen with a more focal diverticulitis. 2. Small volume of reactive ascites in the anatomic pelvis. 3. Severe laxity of the levator ani musculature (particularly on the right side), with evidence of severe pelvic floor prolapse, including cystocele, vaginal vault prolapse, and a large rectocele, as discussed above. 4. Mosaic attenuation lung bases bilaterally, indicative of air trapping from small airway disease. 5. Mild intra and extrahepatic biliary ductal dilatation, strongly favored to be physiologic in this post cholecystectomy patient. 6. Large hiatal hernia. 7. Additional incidental findings, as above.   Electronically Signed   By: Trudie Reed M.D.   On: 04/24/2014 10:56     EKG Interpretation   Date/Time:  Sunday April 24 2014 08:32:06 EST Ventricular Rate:  67 PR Interval:  143 QRS Duration: 107 QT Interval:  451 QTC Calculation: 476  R Axis:   2 Text Interpretation:  Sinus rhythm Low voltage, precordial leads since  last tracing no significant change Confirmed by Kiyona Mcnall  MD, Stefan Karen ([id number])  on 04/24/2014 8:39:10 AM      MDM   Final diagnoses:  Colitis  Near syncope  Other specified hypotension    9:38: BP better, but pt having worsening abd pain and back pain.  Lactate elevated.  Will check ct abd.  Patient presents with nausea vomiting and abdominal cramping with a near syncopal episode. She was initially hypotensive on arrival (SBP in 80s), but her blood pressure is improving with IV fluids. Her lactate was initially elevated but has improved with IV fluids. Her CT scan shows marks colitis. Her abdomen is not significantly tender on exam. She has a minimally elevated troponin but no chest pain or shortness of breath. There is no ischemic changes on EKG. She has a mild elevation in her creatinine. I will go ahead and continue IV fluids. She was given IV Cipro and Flagyl for  colitis. I will consult the hospitalist for admission.     CRITICAL CARE Performed by: Aracelys Glade Total critical care time: 60 Critical care time was exclusive of separately billable procedures and treating other patients. Critical care was necessary to treat or prevent imminent or life-threatening deterioration. Critical care was time spent personally by me on the following activities: development of treatment plan with patient and/or surrogate as well as nursing, discussions with consultants, evaluation of patient's response to treatment, examination of patient, obtaining history from patient or surrogate, ordering and performing treatments and interventions, ordering and review of laboratory studies, ordering and review of radiographic studies, pulse oximetry and re-evaluation of patient's condition.   Rolan Bucco, MD 04/24/14 1146  Rolan Bucco, MD 04/24/14 1149

## 2014-04-24 NOTE — H&P (Signed)
[facility] History and Physical  Mireya Meditz WUJ:[id number] DOB: September 14, 1940 DOA: 04/24/2014  Referring physician: Dr. Fredderick Phenix PCP: No primary care provider on file.   Chief Complaint: nausea and vomiting  HPI: Alean Kromer is a 74 y.o. female past medical history of controlled diabetes mellitus, and hypertension this morning with nausea, vomiting and diarrhea. She relates she vomited 3 times no blood. She then started having watery diarrhea. Was feeling dizzy and lightheaded especially upon standing and she thought she was given a passed out. She ate some candy as she has just gone her insulin but did not improve. She called EMS and was brought to the ED. She notes some abdominal pain cramping like with these bowel movement. She relates no sick contacts, no new medications or travel out of the country urine cruises. She denies any urinary symptoms like burning when she urinates, she denies any cough, shortness of breath, chest pain or palpitations. She had been treated with azithromycin about 2 weeks ago by her primary care doctor for an upper respiratory tract infection.  In the ED: She was hypotensive and she was adequately resuscitated her lactic acid initially was 2.5 after 2 L of normal saline and came down to 1.8. CT scan of the abdomen and pelvis was done that showed results as below. She was started on empiric antibiotics ciprofloxacin and Flagyl. So we're consulted for further evaluation.  Review of Systems:  Constitutional:  No weight loss, night sweats, Fevers, chills, fatigue.  HEENT:  No headaches, Difficulty swallowing,Tooth/dental problems,Sore throat,  No sneezing, itching, ear ache, nasal congestion, post nasal drip,  Cardio-vascular:  No chest pain, Orthopnea, PND, swelling in lower extremities, anasarca, dizziness, palpitations  GI:  No heartburn, indigestion, abdominal pain, nausea, vomiting, diarrhea, change in bowel habits, loss of appetite  Resp:  No shortness of  breath with exertion or at rest. No excess mucus, no productive cough, No non-productive cough, No coughing up of blood.No change in color of mucus.No wheezing.No chest wall deformity  Skin:  no rash or lesions.  GU:  no dysuria, change in color of urine, no urgency or frequency. No flank pain.  Musculoskeletal:  No joint pain or swelling. No decreased range of motion. No back pain.  Psych:  No change in mood or affect. No depression or anxiety. No memory loss.   Past Medical History  Diagnosis Date  . Diabetes mellitus without complication    Past Surgical History  Procedure Laterality Date  . Hernia repair    . Tonsillectomy     Social History:  reports that she has never smoked. She does not have any smokeless tobacco history on file. She reports that she does not drink alcohol or use illicit drugs.  No Known Allergies  Family History  Problem Relation Age of Onset  . Heart attack Father      Prior to Admission medications   Medication Sig Start Date End Date Taking? Authorizing Provider  alendronate (FOSAMAX) 70 MG tablet Take 70 mg by mouth once a week.  03/24/14  Yes Historical Provider, MD  beta carotene w/minerals (OCUVITE) tablet Take 1 tablet by mouth daily.   Yes Historical Provider, MD  ibuprofen (ADVIL,MOTRIN) 800 MG tablet Take 800 mg by mouth every 8 (eight) hours as needed for mild pain.   Yes Historical Provider, MD  insulin NPH Human (HUMULIN N,NOVOLIN N) 100 UNIT/ML injection Inject 22 Units into the skin 2 (two) times daily before a meal.    Yes Historical Provider, MD  lisinopril-hydrochlorothiazide (PRINZIDE,ZESTORETIC) 20-12.5 MG per tablet Take 1 tablet by mouth daily.  03/15/14  Yes Historical Provider, MD  metFORMIN (GLUCOPHAGE) 500 MG tablet Take 500 mg by mouth 2 (two) times daily with a meal.   Yes Historical Provider, MD  omeprazole (PRILOSEC) 40 MG capsule Take 40 mg by mouth daily.   Yes Historical Provider, MD  trimethoprim (TRIMPEX) 100 MG tablet  Take 100 mg by mouth daily.   Yes Historical Provider, MD   Physical Exam: Filed Vitals:   04/24/14 1050 04/24/14 1110 04/24/14 1130 04/24/14 1233  BP: 100/49 101/53 113/99 103/43  Pulse: 70 76 73 77  Temp:      TempSrc:      Resp: SpO2:  100% 100% 100%    Wt Readings from Last 3 Encounters:  No data found for Wt    General:  Appears calm and comfortable Eyes: PERRL, normal lids, irises & conjunctiva ENT: grossly normal hearing, lips & tongue Neck: no LAD, masses or thyromegaly Cardiovascular: RRR, no m/r/g. No LE edema. Respiratory: CTA bilaterally, no w/r/r. Normal respiratory effort. Abdomen: soft, ntnd Skin: no rash or induration seen on limited exam Musculoskeletal: grossly normal tone BUE/BLE Psychiatric: grossly normal mood and affect, speech fluent and appropriate Neurologic: grossly non-focal.          Labs on Admission:  Basic Metabolic Panel:  Recent Labs Lab 04/24/14 0823  NA 131*  K 4.0  CL 104  CO2 17*  GLUCOSE 203*  BUN 30*  CREATININE 1.43*  CALCIUM 8.7   Liver Function Tests:  Recent Labs Lab 04/24/14 0823  AST 34  ALT 16  ALKPHOS 65  BILITOT 1.6*  PROT 6.5  ALBUMIN 3.8   No results for input(s): LIPASE, AMYLASE in the last 168 hours. No results for input(s): AMMONIA in the last 168 hours. CBC:  Recent Labs Lab 04/24/14 0823  WBC 9.0  NEUTROABS 6.6  HGB 11.4*  HCT 33.2*  MCV 93.0  PLT 233   Cardiac Enzymes:  Recent Labs Lab 04/24/14 0823  TROPONINI 0.04*    BNP (last 3 results) No results for input(s): PROBNP in the last 8760 hours. CBG:  Recent Labs Lab 04/24/14 0805  GLUCAP 173*    Radiological Exams on Admission: Dg Chest 2 View  04/24/2014   CLINICAL DATA:  Dizziness and vomiting ; weakness  EXAM: CHEST  2 VIEW  COMPARISON:  March 25, 2010  FINDINGS: The There is no There is no edema or consolidation. The heart size and pulmonary vascularity are within normal limits. No adenopathy. There  is degenerative change in the thoracic spine.  IMPRESSION: No edema or consolidation.   Electronically Signed   By: Bretta Bang M.D.   On: 04/24/2014 08:38   Ct Abdomen Pelvis W Contrast  04/24/2014   CLINICAL DATA:  74 year old female with nausea, vomiting and diarrhea this morning.  EXAM: CT ABDOMEN AND PELVIS WITH CONTRAST  TECHNIQUE: Multidetector CT imaging of the abdomen and pelvis was performed using the standard protocol following bolus administration of intravenous contrast.  CONTRAST:  50mL OMNIPAQUE IOHEXOL 300 MG/ML SOLN, 80mL OMNIPAQUE IOHEXOL 300 MG/ML SOLN  COMPARISON:  No priors.  FINDINGS: Lower chest: Mosaic attenuation throughout the visualize lung bases suggesting air trapping. Mild cardiomegaly. Large hiatal hernia.  Hepatobiliary: No cystic or solid hepatic lesions. Status post cholecystectomy. Very mild intrahepatic biliary ductal dilatation. Common bile duct measures 7 mm in the porta hepatis.  Pancreas: Unremarkable.  Spleen: Unremarkable.  Adrenals/Urinary Tract: Several sub cm low-attenuation lesions in the kidneys bilaterally are too small to definitively characterize, but are favored to represent small cysts. Larger low-attenuation lesions in both kidneys are compatible with simple cysts, with the largest of these measuring up to 1.8 cm in the medial aspect of the lower pole the left kidney. No suspicious renal lesions are noted. No hydroureteronephrosis. Urinary bladder extends well below the level of the pubococcygeal line, compatible with a cystocele. Bilateral adrenal glands are normal in appearance.  Stomach/Bowel: The intra-abdominal portion of the stomach is normal. No pathologic dilatation of small bowel or colon. Extensive colonic wall thickening is noted, predominantly in the distal descending colon and proximal sigmoid colon, where there is extensive surrounding inflammatory changes, suggestive of an acute colitis. Several colonic diverticulae are also noted in the  region of the sigmoid colon, such that acute diverticulitis is not excluded, however, the inflammatory changes and bowel wall thickening is rather diffuse (more so than typically seen in the setting of acute diverticulitis). Normal appendix. Downward displacement of the distal rectum well below the pubococcygeal line, with impression upon the posterior wall of the vagina, compatible with a rectocele.  Vascular/Lymphatic: Atherosclerosis throughout the abdominal and pelvic vasculature, without evidence of aneurysm or dissection. Circumaortic left renal vein (normal anatomical variant) incidentally noted. No lymphadenopathy identified in the abdomen or pelvis.  Reproductive: Uterus and ovaries are unremarkable in appearance. Downward displacement of the vaginal apex below the pubococcygeal line, compatible with vaginal vault prolapse.  Other: Small volume of ascites.  No pneumoperitoneum.  Musculoskeletal: Laxity of the levator ani musculature, particularly on the right side where there is outward bowing. 6 mm of anterolisthesis of L5 upon S1. There are no aggressive appearing lytic or blastic lesions noted in the visualized portions of the skeleton.  IMPRESSION: 1. Findings, as above, most favored to reflect severe colitis of the descending colon and proximal sigmoid colon. There is extensive diverticular disease of the sigmoid colon such that acute diverticulitis is not excluded, however, the extent of bowel wall thickening and inflammatory changes is much greater and last localized than typically seen with a more focal diverticulitis. 2. Small volume of reactive ascites in the anatomic pelvis. 3. Severe laxity of the levator ani musculature (particularly on the right side), with evidence of severe pelvic floor prolapse, including cystocele, vaginal vault prolapse, and a large rectocele, as discussed above. 4. Mosaic attenuation lung bases bilaterally, indicative of air trapping from small airway disease. 5. Mild  intra and extrahepatic biliary ductal dilatation, strongly favored to be physiologic in this post cholecystectomy patient. 6. Large hiatal hernia. 7. Additional incidental findings, as above.   Electronically Signed   By: Trudie Reed M.D.   On: 04/24/2014 10:56    EKG: Independently reviewed. Normal sinus rhythm normal axis.  Assessment/Plan Colitis/ Hypotension/  High anion gap metabolic acidosis: - Most likely infectious etiology, she doesn't have a leukocytosis. - She has not seen any blood in her stools but due to her episode of hypotension I agree with starting her on ciprofloxacin and Flagyl. - I will admitted to MedSurg unit, continue IV fluids. - She had been recently been treated with azithromycin so we'll check a C. Difficile. - We'll also send stools for culture. And send an HIV. - Her high anion gap metabolic acidosis is probably to decreased organ perfusion. It is now resolved.  Acute kidney injury: - Baseline creatinine 1. 1- 1.3. - This most likely prerenal in etiology in the setting  of ACE inhibitor and ibuprofen use, will check urinary sodium and urinary creatinine. - Continue IV hydration. Monitor strict I's and O's. - Hold her ACE inhibitor and her metformin.   Essential hypertension: - Hold her antihypertensive medications. - Once her creatinine has improved and her creatinine returns to baseline we can resume her anti- hypertensive.  Controlled type 2 diabetes mellitus with microalbuminuria or microproteinuria: - Hold metformin and continue NPH at 15 units twice a day plus sliding scale insulin. Glaucoma modified diet.   Hyponatremia: - Probably due to hypovolemia. - Continue IV fluids check a basic metabolic panel in the morning.  Normocytic anemia - Follow-up with PCP as an outpatient. - Monitor hemoglobin as it might drop with hydration.    Code Status: full DVT Prophylaxis:heparin Family Communication: friends Disposition Plan: inpatient  Time  spent: 70 minutes  FELIZ Rosine BeatORTIZ, ABRAHAM [facility] Pager [phone]

## 2014-04-24 NOTE — ED Notes (Signed)
Report given to Jenna, RN

## 2014-04-25 LAB — GLUCOSE, CAPILLARY
GLUCOSE-CAPILLARY: 92 mg/dL (ref 70–99)
Glucose-Capillary: 73 mg/dL (ref 70–99)
Glucose-Capillary: 120 mg/dL — ABNORMAL HIGH (ref 70–99)
Glucose-Capillary: 97 mg/dL (ref 70–99)

## 2014-04-25 LAB — BASIC METABOLIC PANEL
Anion gap: 5 (ref 5–15)
BUN: 25 mg/dL — ABNORMAL HIGH (ref 6–23)
CALCIUM: 8.1 mg/dL — AB (ref 8.4–10.5)
CO2: 22 mmol/L (ref 19–32)
Chloride: 107 mEq/L (ref 96–112)
Creatinine, Ser: 1.18 mg/dL — ABNORMAL HIGH (ref 0.50–1.10)
GFR calc Af Amer: 52 mL/min — ABNORMAL LOW (ref >90)
GFR calc non Af Amer: 45 mL/min — ABNORMAL LOW (ref >90)
Glucose, Bld: 89 mg/dL (ref 70–99)
POTASSIUM: 4 mmol/L (ref 3.5–5.1)
Sodium: 134 mmol/L — ABNORMAL LOW (ref 135–145)

## 2014-04-25 LAB — CBC
HCT: 27.5 % — ABNORMAL LOW (ref 36.0–46.0)
HEMOGLOBIN: 9.5 g/dL — AB (ref 12.0–15.0)
MCH: 32.4 pg (ref 26.0–34.0)
MCHC: 34.5 g/dL (ref 30.0–36.0)
MCV: 93.9 fL (ref 78.0–100.0)
Platelets: 213 10*3/uL (ref 150–400)
RBC: 2.93 MIL/uL — ABNORMAL LOW (ref 3.87–5.11)
RDW: 13.9 % (ref 11.5–15.5)
WBC: 10.1 10*3/uL (ref 4.0–10.5)

## 2014-04-25 LAB — CREATININE, URINE, RANDOM: Creatinine, Urine: 43.8 mg/dL

## 2014-04-25 LAB — HIV ANTIBODY (ROUTINE TESTING W REFLEX)
HIV 1/O/2 Abs-Index Value: <1 (ref <1.00)
HIV-1/HIV-2 Ab: NONREACTIVE

## 2014-04-25 LAB — SODIUM, URINE, RANDOM: SODIUM UR: 106 meq/L

## 2014-04-25 LAB — CLOSTRIDIUM DIFFICILE BY PCR: Toxigenic C. Difficile by PCR: NEGATIVE

## 2014-04-25 MED ORDER — LEVOFLOXACIN 500 MG PO TABS
500.0000 mg | ORAL_TABLET | ORAL | Status: DC
  Administered 2014-04-25: 500 mg via ORAL
  Filled 2014-04-25 (×2): qty 1

## 2014-04-25 MED ORDER — SODIUM CHLORIDE 0.9 % IV SOLN
INTRAVENOUS | Status: AC
  Administered 2014-04-25: 03:00:00 via INTRAVENOUS

## 2014-04-25 MED ORDER — METRONIDAZOLE 500 MG PO TABS
500.0000 mg | ORAL_TABLET | Freq: Three times a day (TID) | ORAL | Status: DC
  Administered 2014-04-25 – 2014-04-26 (×3): 500 mg via ORAL
  Filled 2014-04-25 (×6): qty 1

## 2014-04-25 NOTE — Progress Notes (Signed)
ANTIBIOTIC CONSULT NOTE - INITIAL  Pharmacy Consult for Levaquin Indication: colitis  Allergies  Allergen Reactions  . Ciprofloxacin     Hives. Patient reported it was itching at the IV site.    Patient Measurements: Height: 5\' 1"  (154.9 cm) Weight: 187 lb 3.2 oz (84.913 kg) IBW/kg (Calculated) : 47.8   Vital Signs: Temp: 98.4 F (36.9 C) (01/11 0835) Temp Source: Oral (01/11 0835) BP: 108/32 mmHg (01/11 0835) Pulse Rate: 80 (01/11 0835) Intake/Output from previous day: 01/10 0701 - 01/11 0700 In: 1620 [P.O.:120; I.V.:1500] Out: -  Intake/Output from this shift:    Labs:  Recent Labs  04/24/14 0823 04/24/14 1531 04/25/14 0410  WBC 9.0  --  10.1  HGB 11.4*  --  9.5*  PLT 233  --  213  LABCREA  --  43.8  --   CREATININE 1.43*  --  1.18*   Estimated Creatinine Clearance: 42 mL/min (by C-G formula based on Cr of 1.18).    Medical History: Past Medical History  Diagnosis Date  . Diabetes mellitus without complication    Microbiology: 1/10 stool: collected 1/10 stool CDiff PCR: collected    Anti-infectives: 1/10 >>Flagyl  >> 1/10>>Levaquin  >>    Assessment: 74 yo F with PMHx DM presented to ED with N/V and admitted 1/10 with severe colitis, hypotension.  Empiric IV Levaquin and Flagyl were started.  On 1/11 hospitalist changed Flagyl to PO and requested pharmacy assistance for dosing of PO Levaquin.  Allergy hx includes Cipro (hives/itching limited to IV site).  Today, 04/25/2014: D#2 Flagyl 500 mg IV q8h / Levaquin 500 mg IV q24h Afebrile since admission WBC WNL since admission SCr improving Hypotension improved Flagyl was changed to 500mg  PO q8h by MD Pharmacy was asked to select PO dosage for Levaquin  Goal of Therapy:  Appropriate antibiotic dosing for indication and renal function; eradication of infection.   Plan:  1. Change Levaquin to 500 mg PO q24h. 2. Flagyl 500 mg PO q8h as ordered by MD 3. Follow renal function, cultures, clinical  course.  Elie Goodyandy Kameren Pargas, PharmD, BCPS Pager: [phone] 04/25/2014  11:42 AM

## 2014-04-25 NOTE — Progress Notes (Signed)
[facility] PROGRESS NOTE  Nallely Yost ZOX:[id number] DOB: Jun 19, 1940 DOA: 04/24/2014 PCP: No primary care provider on file.  Assessment/Plan: Colitis/ Hypotension/ High anion gap metabolic acidosis: - Most likely infectious etiology - No leukocytosis. - Cdiff neg - Her high anion gap metabolic acidosis is probably to decreased organ perfusion. It is now resolved. - Transition to PO levaquin and flagyl  Acute kidney injury: - Baseline creatinine 1. 1- 1.3. - This most likely prerenal in etiology in the setting of ACE inhibitor and ibuprofen use, will check urinary sodium and urinary creatinine. - Continue IV hydration. Monitor strict I's and O's. - Hold her ACE inhibitor and her metformin.  Essential hypertension: - Hold her antihypertensive medications. - Once her creatinine has improved and her creatinine returns to baseline we can resume her anti- hypertensive.  Controlled type 2 diabetes mellitus with microalbuminuria or microproteinuria: - Hold metformin and continue NPH at 15 units twice a day plus sliding scale insulin. Glaucoma modified diet.  Hyponatremia: - Probably due to hypovolemia. - Continue IV fluids check a basic metabolic panel in the morning.  Normocytic anemia - Follow-up with PCP as an outpatient. - Monitor hemoglobin as it might drop with hydration.  Code Status: Full Family Communication: Pt in room, son at bedside (indicate person spoken with, relationship, and if by phone, the number) Disposition Plan: Possible d/c 1/11  Consultants:  none  Procedures:  none  Antibiotics:  Levaquin 1/10>>>  Flagyl 1/10>>>  HPI/Subjective: No complaints. Feels much better today  Objective: Filed Vitals:   04/24/14 1355 04/24/14 2132 04/25/14 0419 04/25/14 0835  BP: 99/63 133/90 93/44 108/32  Pulse: 77 96 82 80  Temp: 98.1 F (36.7 C) 98.4 F (36.9 C) 98.1 F (36.7 C) 98.4 F (36.9 C)  TempSrc: Oral Oral Oral Oral  Resp: 18 16     Height:  (1.549 m)     Weight: 84.913 kg (187 lb 3.2 oz)     SpO2: 100% 100% 98% 100%    Intake/Output Summary (Last 24 hours) at 04/25/14 1721 Last data filed at 04/24/14 1831  Gross per 24 hour  Intake    120 ml  Output      0 ml  Net    120 ml   Filed Weights   04/24/14 1355  Weight: 84.913 kg (187 lb 3.2 oz)    Exam:   General:  Awake, in nad  Cardiovascular: regular, s1, s2  Respiratory: normal resp effort, no wheezing  Abdomen: soft,nondistended  Musculoskeletal: perfused, no clubbing   Data Reviewed: Basic Metabolic Panel:  Recent Labs Lab 04/24/14 0823 04/25/14 0410  NA 131* 134*  K 4.0 4.0  CL 104 107  CO2 17* 22  GLUCOSE 203* 89  BUN 30* 25*  CREATININE 1.43* 1.18*  CALCIUM 8.7 8.1*   Liver Function Tests:  Recent Labs Lab 04/24/14 0823  AST 34  ALT 16  ALKPHOS 65  BILITOT 1.6*  PROT 6.5  ALBUMIN 3.8   No results for input(s): LIPASE, AMYLASE in the last 168 hours. No results for input(s): AMMONIA in the last 168 hours. CBC:  Recent Labs Lab 04/24/14 0823 04/25/14 0410  WBC 9.0 10.1  NEUTROABS 6.6  --   HGB 11.4* 9.5*  HCT 33.2* 27.5*  MCV 93.0 93.9  PLT 233 213   Cardiac Enzymes:  Recent Labs Lab 04/24/14 0823  TROPONINI 0.04*   BNP (last 3 results) No results for input(s): PROBNP in the last 8760 hours. CBG:  Recent Labs  Lab 04/24/14 0805 04/24/14 1756 04/24/14 2138 04/25/14 0725 04/25/14 1200  GLUCAP 173* 115* 146* 92 73    Recent Results (from the past 240 hour(s))  Clostridium Difficile by PCR     Status: None   Collection Time: 04/24/14  3:31 PM  Result Value Ref Range Status   C difficile by pcr NEGATIVE NEGATIVE Final    Comment: Performed at [facility]     Studies: Dg Chest 2 View  04/24/2014   CLINICAL DATA:  Dizziness and vomiting ; weakness  EXAM: CHEST  2 VIEW  COMPARISON:  March 25, 2010  FINDINGS: The There is no There is no edema or consolidation. The heart size and  pulmonary vascularity are within normal limits. No adenopathy. There is degenerative change in the thoracic spine.  IMPRESSION: No edema or consolidation.   Electronically Signed   By: Bretta Bang M.D.   On: 04/24/2014 08:38   Ct Abdomen Pelvis W Contrast  04/24/2014   CLINICAL DATA:  74 year old female with nausea, vomiting and diarrhea this morning.  EXAM: CT ABDOMEN AND PELVIS WITH CONTRAST  TECHNIQUE: Multidetector CT imaging of the abdomen and pelvis was performed using the standard protocol following bolus administration of intravenous contrast.  CONTRAST:  50mL OMNIPAQUE IOHEXOL 300 MG/ML SOLN, 80mL OMNIPAQUE IOHEXOL 300 MG/ML SOLN  COMPARISON:  No priors.  FINDINGS: Lower chest: Mosaic attenuation throughout the visualize lung bases suggesting air trapping. Mild cardiomegaly. Large hiatal hernia.  Hepatobiliary: No cystic or solid hepatic lesions. Status post cholecystectomy. Very mild intrahepatic biliary ductal dilatation. Common bile duct measures 7 mm in the porta hepatis.  Pancreas: Unremarkable.  Spleen: Unremarkable.  Adrenals/Urinary Tract: Several sub cm low-attenuation lesions in the kidneys bilaterally are too small to definitively characterize, but are favored to represent small cysts. Larger low-attenuation lesions in both kidneys are compatible with simple cysts, with the largest of these measuring up to 1.8 cm in the medial aspect of the lower pole the left kidney. No suspicious renal lesions are noted. No hydroureteronephrosis. Urinary bladder extends well below the level of the pubococcygeal line, compatible with a cystocele. Bilateral adrenal glands are normal in appearance.  Stomach/Bowel: The intra-abdominal portion of the stomach is normal. No pathologic dilatation of small bowel or colon. Extensive colonic wall thickening is noted, predominantly in the distal descending colon and proximal sigmoid colon, where there is extensive surrounding inflammatory changes, suggestive of an  acute colitis. Several colonic diverticulae are also noted in the region of the sigmoid colon, such that acute diverticulitis is not excluded, however, the inflammatory changes and bowel wall thickening is rather diffuse (more so than typically seen in the setting of acute diverticulitis). Normal appendix. Downward displacement of the distal rectum well below the pubococcygeal line, with impression upon the posterior wall of the vagina, compatible with a rectocele.  Vascular/Lymphatic: Atherosclerosis throughout the abdominal and pelvic vasculature, without evidence of aneurysm or dissection. Circumaortic left renal vein (normal anatomical variant) incidentally noted. No lymphadenopathy identified in the abdomen or pelvis.  Reproductive: Uterus and ovaries are unremarkable in appearance. Downward displacement of the vaginal apex below the pubococcygeal line, compatible with vaginal vault prolapse.  Other: Small volume of ascites.  No pneumoperitoneum.  Musculoskeletal: Laxity of the levator ani musculature, particularly on the right side where there is outward bowing. 6 mm of anterolisthesis of L5 upon S1. There are no aggressive appearing lytic or blastic lesions noted in the visualized portions of the skeleton.  IMPRESSION: 1. Findings, as above,  most favored to reflect severe colitis of the descending colon and proximal sigmoid colon. There is extensive diverticular disease of the sigmoid colon such that acute diverticulitis is not excluded, however, the extent of bowel wall thickening and inflammatory changes is much greater and last localized than typically seen with a more focal diverticulitis. 2. Small volume of reactive ascites in the anatomic pelvis. 3. Severe laxity of the levator ani musculature (particularly on the right side), with evidence of severe pelvic floor prolapse, including cystocele, vaginal vault prolapse, and a large rectocele, as discussed above. 4. Mosaic attenuation lung bases  bilaterally, indicative of air trapping from small airway disease. 5. Mild intra and extrahepatic biliary ductal dilatation, strongly favored to be physiologic in this post cholecystectomy patient. 6. Large hiatal hernia. 7. Additional incidental findings, as above.   Electronically Signed   By: Trudie Reedaniel  Entrikin M.D.   On: 04/24/2014 10:56    Scheduled Meds: . heparin  5,000 Units Subcutaneous 3 times per day  . insulin aspart  0-5 Units Subcutaneous QHS  . insulin aspart  0-9 Units Subcutaneous TID WC  . insulin NPH Human  15 Units Subcutaneous BID AC & HS  . levofloxacin  500 mg Oral Q24H  . metroNIDAZOLE  500 mg Oral 3 times per day  . pantoprazole  40 mg Oral Daily   Continuous Infusions:   Principal Problem:   Acute kidney injury Active Problems:   Colitis   Essential hypertension   Controlled type 2 diabetes mellitus with microalbuminuria or microproteinuria   Hypotension   High anion gap metabolic acidosis   Hyponatremia   Normocytic anemia   Infectious colitis  Time spent: 30min  CHIU, STEPHEN K  [facility] Pager [phone]. If 7PM-7AM, please contact night-coverage at [email or web address], password [email or web address] 04/25/2014, 5:21 PM  LOS: 1 day

## 2014-04-26 DIAGNOSIS — D649 Anemia, unspecified: Secondary | ICD-10-CM

## 2014-04-26 LAB — CBC
HEMATOCRIT: 25.3 % — AB (ref 36.0–46.0)
HEMOGLOBIN: 8.8 g/dL — AB (ref 12.0–15.0)
MCH: 32.8 pg (ref 26.0–34.0)
MCHC: 34.8 g/dL (ref 30.0–36.0)
MCV: 94.4 fL (ref 78.0–100.0)
Platelets: 186 10*3/uL (ref 150–400)
RBC: 2.68 MIL/uL — ABNORMAL LOW (ref 3.87–5.11)
RDW: 14.2 % (ref 11.5–15.5)
WBC: 6.6 10*3/uL (ref 4.0–10.5)

## 2014-04-26 LAB — BASIC METABOLIC PANEL
ANION GAP: 6 (ref 5–15)
BUN: 16 mg/dL (ref 6–23)
CALCIUM: 7.8 mg/dL — AB (ref 8.4–10.5)
CO2: 22 mmol/L (ref 19–32)
Chloride: 110 mEq/L (ref 96–112)
Creatinine, Ser: 1.03 mg/dL (ref 0.50–1.10)
GFR calc non Af Amer: 53 mL/min — ABNORMAL LOW (ref >90)
GFR, EST AFRICAN AMERICAN: 61 mL/min — AB (ref >90)
GLUCOSE: 88 mg/dL (ref 70–99)
POTASSIUM: 3.9 mmol/L (ref 3.5–5.1)
Sodium: 138 mmol/L (ref 135–145)

## 2014-04-26 LAB — GLUCOSE, CAPILLARY: GLUCOSE-CAPILLARY: 96 mg/dL (ref 70–99)

## 2014-04-26 MED ORDER — METRONIDAZOLE 500 MG PO TABS: 500.0000 mg | ORAL_TABLET | Freq: Three times a day (TID) | ORAL | Status: DC

## 2014-04-26 MED ORDER — LEVOFLOXACIN 500 MG PO TABS: 500.0000 mg | ORAL_TABLET | ORAL | Status: DC

## 2014-04-26 NOTE — Progress Notes (Addendum)
Patient CBG 97 at hs. Patient refused NPH. Afraid it would "lower her sugar too low" .

## 2014-04-26 NOTE — Plan of Care (Signed)
Problem: Phase I Progression Outcomes Goal: Pain controlled with appropriate interventions Outcome: Completed/Met Date Met:  04/26/14 Denies pain

## 2014-04-26 NOTE — Discharge Summary (Signed)
Physician Discharge Summary  Victoria Tyler ZOX:[id number] DOB: September 26, 1940 DOA: 04/24/2014  PCP: No primary care provider on file.  Admit date: 04/24/2014 Discharge date: 04/26/2014  Time spent: 30 minutes  Recommendations for Outpatient Follow-up:  1. Follow up with PCP in 1-2 weeks  Discharge Diagnoses:  Principal Problem:   Acute kidney injury Active Problems:   Colitis   Essential hypertension   Controlled type 2 diabetes mellitus with microalbuminuria or microproteinuria   Hypotension   High anion gap metabolic acidosis   Hyponatremia   Normocytic anemia   Infectious colitis   Discharge Condition: Improved  Diet recommendation: Diabetic  Filed Weights   04/24/14 1355  Weight: 84.913 kg (187 lb 3.2 oz)    History of present illness:  Please see admit h and p from 1/10 for details. Briefly, pt presents with n/v, found to have enteritis. The patient was admitted for further work up.  Hospital Course: Colitis/ Hypotension/ High anion gap metabolic acidosis: - Most likely infectious etiology - No leukocytosis. - Cdiff neg - Her high anion gap metabolic acidosis is probably to decreased organ perfusion. It is now resolved. - Transition to PO levaquin and flagyl  Acute kidney injury: - Baseline creatinine 1. 1- 1.3. - This most likely prerenal in etiology in the setting of ACE inhibitor and ibuprofen use, will check urinary sodium and urinary creatinine. - Continue IV hydration. Monitor strict I's and O's. - Hold her ACE inhibitor and her metformin until d/c  Essential hypertension: - Hold her antihypertensive medications. - Once her creatinine has improved and her creatinine returns to baseline we can resume her anti-hypertensive.  Controlled type 2 diabetes mellitus with microalbuminuria or microproteinuria: - Hold metformin and continue NPH at 15 units twice a day plus sliding scale insulin. Glaucoma modified diet.  Hyponatremia: - Probably due to  hypovolemia. - Continue IV fluids check a basic metabolic panel in the morning.  Normocytic anemia - Follow-up with PCP as an outpatient. - Monitor hemoglobin as it might drop with hydration.  Consultations:  none  Discharge Exam: Filed Vitals:   04/25/14 1400 04/25/14 2204 04/25/14 2218 04/26/14 0604  BP: 101/60 103/45  120/59  Pulse: 79 80 74 68  Temp: 99.2 F (37.3 C) 98.1 F (36.7 C)  97.5 F (36.4 C)  TempSrc: Oral Oral  Oral  Resp: Height:      Weight:      SpO2: 100% 100%  100%    General: awake, in nad Cardiovascular: regular, s1, s2 Respiratory: normal resp effort, no wheezing  Discharge Instructions     Medication List    TAKE these medications        alendronate 70 MG tablet  Commonly known as:  FOSAMAX  Take 70 mg by mouth once a week.     beta carotene w/minerals tablet  Take 1 tablet by mouth daily.     ibuprofen 800 MG tablet  Commonly known as:  ADVIL,MOTRIN  Take 800 mg by mouth every 8 (eight) hours as needed for mild pain.     insulin NPH Human 100 UNIT/ML injection  Commonly known as:  HUMULIN N,NOVOLIN N  Inject 22 Units into the skin 2 (two) times daily before a meal.     levofloxacin 500 MG tablet  Commonly known as:  LEVAQUIN  Take 1 tablet (500 mg total) by mouth daily.     lisinopril-hydrochlorothiazide 20-12.5 MG per tablet  Commonly known as:  PRINZIDE,ZESTORETIC  Take 1 tablet by  mouth daily.     metFORMIN 500 MG tablet  Commonly known as:  GLUCOPHAGE  Take 500 mg by mouth 2 (two) times daily with a meal.     metroNIDAZOLE 500 MG tablet  Commonly known as:  FLAGYL  Take 1 tablet (500 mg total) by mouth every 8 (eight) hours.     omeprazole 40 MG capsule  Commonly known as:  PRILOSEC  Take 40 mg by mouth daily.     trimethoprim 100 MG tablet  Commonly known as:  TRIMPEX  Take 100 mg by mouth daily.       Allergies  Allergen Reactions  . Ciprofloxacin     Hives. Patient reported it was itching at  the IV site.   Follow-up Information    Follow up with Follow up with your PCP. Schedule an appointment as soon as possible for a visit in 1 week.       The results of significant diagnostics from this hospitalization (including imaging, microbiology, ancillary and laboratory) are listed below for reference.    Significant Diagnostic Studies: Dg Chest 2 View  04/24/2014   CLINICAL DATA:  Dizziness and vomiting ; weakness  EXAM: CHEST  2 VIEW  COMPARISON:  March 25, 2010  FINDINGS: The There is no There is no edema or consolidation. The heart size and pulmonary vascularity are within normal limits. No adenopathy. There is degenerative change in the thoracic spine.  IMPRESSION: No edema or consolidation.   Electronically Signed   By: Bretta BangWilliam  Woodruff M.D.   On: 04/24/2014 08:38   Ct Abdomen Pelvis W Contrast  04/24/2014   CLINICAL DATA:  74 year old female with nausea, vomiting and diarrhea this morning.  EXAM: CT ABDOMEN AND PELVIS WITH CONTRAST  TECHNIQUE: Multidetector CT imaging of the abdomen and pelvis was performed using the standard protocol following bolus administration of intravenous contrast.  CONTRAST:  50mL OMNIPAQUE IOHEXOL 300 MG/ML SOLN, 80mL OMNIPAQUE IOHEXOL 300 MG/ML SOLN  COMPARISON:  No priors.  FINDINGS: Lower chest: Mosaic attenuation throughout the visualize lung bases suggesting air trapping. Mild cardiomegaly. Large hiatal hernia.  Hepatobiliary: No cystic or solid hepatic lesions. Status post cholecystectomy. Very mild intrahepatic biliary ductal dilatation. Common bile duct measures 7 mm in the porta hepatis.  Pancreas: Unremarkable.  Spleen: Unremarkable.  Adrenals/Urinary Tract: Several sub cm low-attenuation lesions in the kidneys bilaterally are too small to definitively characterize, but are favored to represent small cysts. Larger low-attenuation lesions in both kidneys are compatible with simple cysts, with the largest of these measuring up to 1.8 cm in the medial  aspect of the lower pole the left kidney. No suspicious renal lesions are noted. No hydroureteronephrosis. Urinary bladder extends well below the level of the pubococcygeal line, compatible with a cystocele. Bilateral adrenal glands are normal in appearance.  Stomach/Bowel: The intra-abdominal portion of the stomach is normal. No pathologic dilatation of small bowel or colon. Extensive colonic wall thickening is noted, predominantly in the distal descending colon and proximal sigmoid colon, where there is extensive surrounding inflammatory changes, suggestive of an acute colitis. Several colonic diverticulae are also noted in the region of the sigmoid colon, such that acute diverticulitis is not excluded, however, the inflammatory changes and bowel wall thickening is rather diffuse (more so than typically seen in the setting of acute diverticulitis). Normal appendix. Downward displacement of the distal rectum well below the pubococcygeal line, with impression upon the posterior wall of the vagina, compatible with a rectocele.  Vascular/Lymphatic: Atherosclerosis throughout the abdominal  and pelvic vasculature, without evidence of aneurysm or dissection. Circumaortic left renal vein (normal anatomical variant) incidentally noted. No lymphadenopathy identified in the abdomen or pelvis.  Reproductive: Uterus and ovaries are unremarkable in appearance. Downward displacement of the vaginal apex below the pubococcygeal line, compatible with vaginal vault prolapse.  Other: Small volume of ascites.  No pneumoperitoneum.  Musculoskeletal: Laxity of the levator ani musculature, particularly on the right side where there is outward bowing. 6 mm of anterolisthesis of L5 upon S1. There are no aggressive appearing lytic or blastic lesions noted in the visualized portions of the skeleton.  IMPRESSION: 1. Findings, as above, most favored to reflect severe colitis of the descending colon and proximal sigmoid colon. There is  extensive diverticular disease of the sigmoid colon such that acute diverticulitis is not excluded, however, the extent of bowel wall thickening and inflammatory changes is much greater and last localized than typically seen with a more focal diverticulitis. 2. Small volume of reactive ascites in the anatomic pelvis. 3. Severe laxity of the levator ani musculature (particularly on the right side), with evidence of severe pelvic floor prolapse, including cystocele, vaginal vault prolapse, and a large rectocele, as discussed above. 4. Mosaic attenuation lung bases bilaterally, indicative of air trapping from small airway disease. 5. Mild intra and extrahepatic biliary ductal dilatation, strongly favored to be physiologic in this post cholecystectomy patient. 6. Large hiatal hernia. 7. Additional incidental findings, as above.   Electronically Signed   By: Trudie Reed M.D.   On: 04/24/2014 10:56    Microbiology: Recent Results (from the past 240 hour(s))  Clostridium Difficile by PCR     Status: None   Collection Time: 04/24/14  3:31 PM  Result Value Ref Range Status   C difficile by pcr NEGATIVE NEGATIVE Final    Comment: Performed at [facility]  Stool culture     Status: None (Preliminary result)   Collection Time: 04/24/14  3:31 PM  Result Value Ref Range Status   Specimen Description STOOL  Final   Special Requests NONE  Final   Culture   Final    NO SUSPICIOUS COLONIES, CONTINUING TO HOLD Performed at [facility]    Report Status PENDING  Incomplete     Labs: Basic Metabolic Panel:  Recent Labs Lab 04/24/14 0823 04/25/14 0410 04/26/14 0414  NA 131* 134* 138  K 4.0 4.0 3.9  CL 104 107 110  CO2 17* 22 22  GLUCOSE 203* 89 88  BUN 30* 25* 16  CREATININE 1.43* 1.18* 1.03  CALCIUM 8.7 8.1* 7.8*   Liver Function Tests:  Recent Labs Lab 04/24/14 0823  AST 34  ALT 16  ALKPHOS 65  BILITOT 1.6*  PROT 6.5  ALBUMIN 3.8   No results for input(s): LIPASE,  AMYLASE in the last 168 hours. No results for input(s): AMMONIA in the last 168 hours. CBC:  Recent Labs Lab 04/24/14 0823 04/25/14 0410 04/26/14 0414  WBC 9.0 10.1 6.6  NEUTROABS 6.6  --   --   HGB 11.4* 9.5* 8.8*  HCT 33.2* 27.5* 25.3*  MCV 93.0 93.9 94.4  PLT 233 213 186   Cardiac Enzymes:  Recent Labs Lab 04/24/14 0823  TROPONINI 0.04*   BNP: BNP (last 3 results) No results for input(s): PROBNP in the last 8760 hours. CBG:  Recent Labs Lab 04/24/14 2138 04/25/14 0725 04/25/14 1200 04/25/14 1742 04/25/14 2207  GLUCAP 146* 92 73 120* 97   Signed:  Hasson Gaspard K  [facility] 04/26/2014, 9:33 AM

## 2014-04-26 NOTE — Plan of Care (Signed)
Problem: Phase I Progression Outcomes Goal: Hemodynamically stable Outcome: Progressing BP low but patient asymptomatic. No complaints of dizziness.

## 2014-04-26 NOTE — Plan of Care (Signed)
Problem: Phase I Progression Outcomes Goal: OOB as tolerated unless otherwise ordered Outcome: Completed/Met Date Met:  04/26/14 Ambulates in room on own

## 2014-04-27 LAB — STOOL CULTURE

## 2014-04-29 LAB — GLUCOSE, CAPILLARY: Glucose-Capillary: 84 mg/dL (ref 70–99)

## 2014-07-20 DIAGNOSIS — Z0000 Encounter for general adult medical examination without abnormal findings: Secondary | ICD-10-CM | POA: Insufficient documentation

## 2014-08-18 ENCOUNTER — Other Ambulatory Visit: Payer: Self-pay | Admitting: Family Medicine

## 2014-08-18 DIAGNOSIS — Z1231 Encounter for screening mammogram for malignant neoplasm of breast: Secondary | ICD-10-CM

## 2015-04-28 DIAGNOSIS — I10 Essential (primary) hypertension: Secondary | ICD-10-CM | POA: Diagnosis not present

## 2015-04-28 DIAGNOSIS — E119 Type 2 diabetes mellitus without complications: Secondary | ICD-10-CM | POA: Diagnosis not present

## 2015-04-28 DIAGNOSIS — Z794 Long term (current) use of insulin: Secondary | ICD-10-CM | POA: Diagnosis not present

## 2015-04-28 DIAGNOSIS — K219 Gastro-esophageal reflux disease without esophagitis: Secondary | ICD-10-CM | POA: Diagnosis not present

## 2015-04-28 DIAGNOSIS — Z418 Encounter for other procedures for purposes other than remedying health state: Secondary | ICD-10-CM | POA: Diagnosis not present

## 2015-04-28 DIAGNOSIS — M810 Age-related osteoporosis without current pathological fracture: Secondary | ICD-10-CM | POA: Diagnosis not present

## 2015-07-09 ENCOUNTER — Emergency Department (HOSPITAL_COMMUNITY)
Admission: EM | Admit: 2015-07-09 | Discharge: 2015-07-09 | Disposition: A | Discharge status: Home / Self Care | Payer: PPO | Attending: Emergency Medicine | Admitting: Emergency Medicine

## 2015-07-09 ENCOUNTER — Emergency Department (HOSPITAL_COMMUNITY): Payer: PPO

## 2015-07-09 ENCOUNTER — Encounter (HOSPITAL_COMMUNITY): Payer: Self-pay | Admitting: Oncology

## 2015-07-09 DIAGNOSIS — Z792 Long term (current) use of antibiotics: Secondary | ICD-10-CM | POA: Diagnosis not present

## 2015-07-09 DIAGNOSIS — M25561 Pain in right knee: Secondary | ICD-10-CM | POA: Insufficient documentation

## 2015-07-09 DIAGNOSIS — Z7984 Long term (current) use of oral hypoglycemic drugs: Secondary | ICD-10-CM | POA: Insufficient documentation

## 2015-07-09 DIAGNOSIS — R52 Pain, unspecified: Secondary | ICD-10-CM | POA: Diagnosis not present

## 2015-07-09 DIAGNOSIS — Z794 Long term (current) use of insulin: Secondary | ICD-10-CM | POA: Diagnosis not present

## 2015-07-09 DIAGNOSIS — M25569 Pain in unspecified knee: Secondary | ICD-10-CM | POA: Diagnosis not present

## 2015-07-09 DIAGNOSIS — Z79899 Other long term (current) drug therapy: Secondary | ICD-10-CM | POA: Insufficient documentation

## 2015-07-09 DIAGNOSIS — E119 Type 2 diabetes mellitus without complications: Secondary | ICD-10-CM | POA: Insufficient documentation

## 2015-07-09 IMAGING — CR DG KNEE COMPLETE 4+V*R*
4 series · 4 of 4 positions shown · non-contrast
Comparison: None.

CLINICAL DATA: 75-year-old female with right knee pain.

EXAM:
RIGHT KNEE - COMPLETE 4+ VIEW

[t knee ap right]
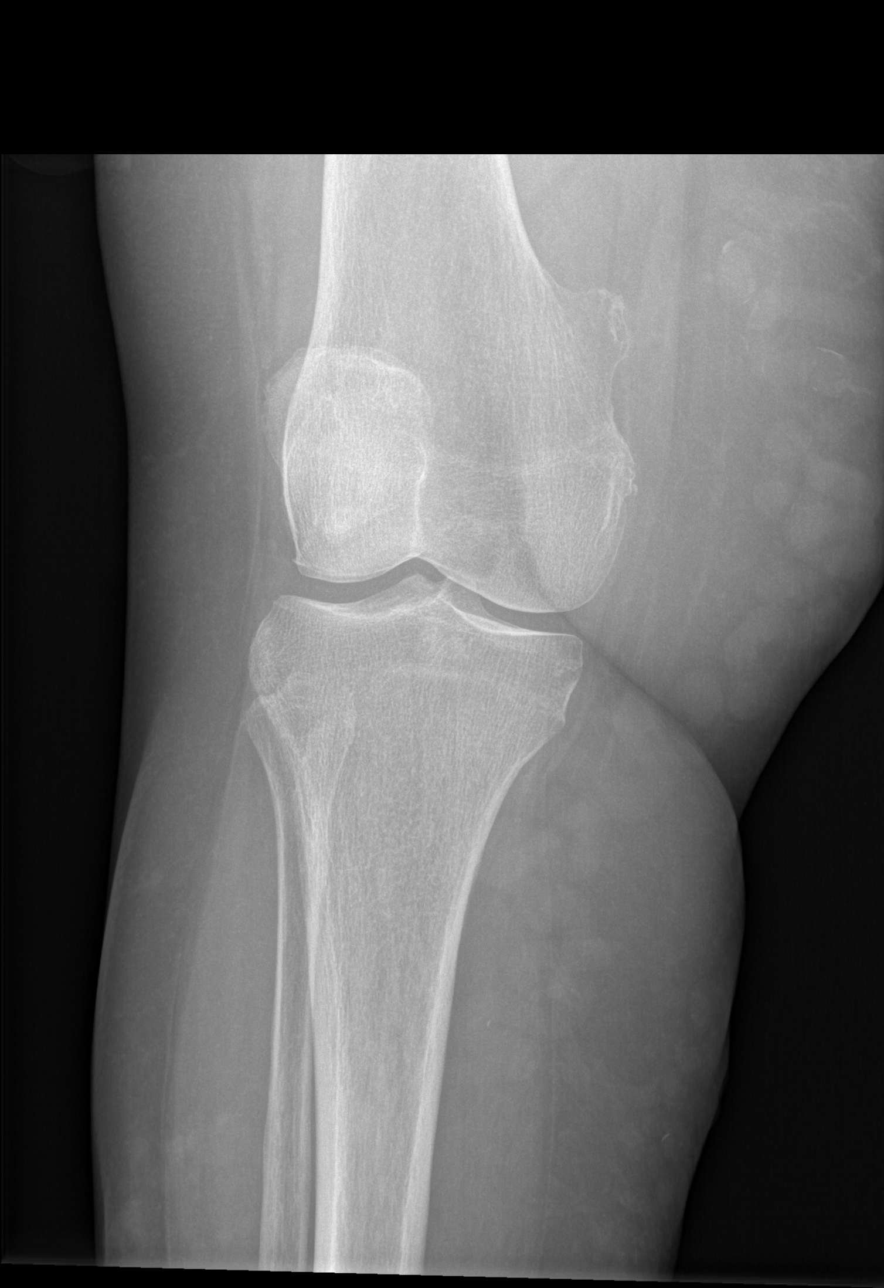

[t knee obl right (1 of 2)]
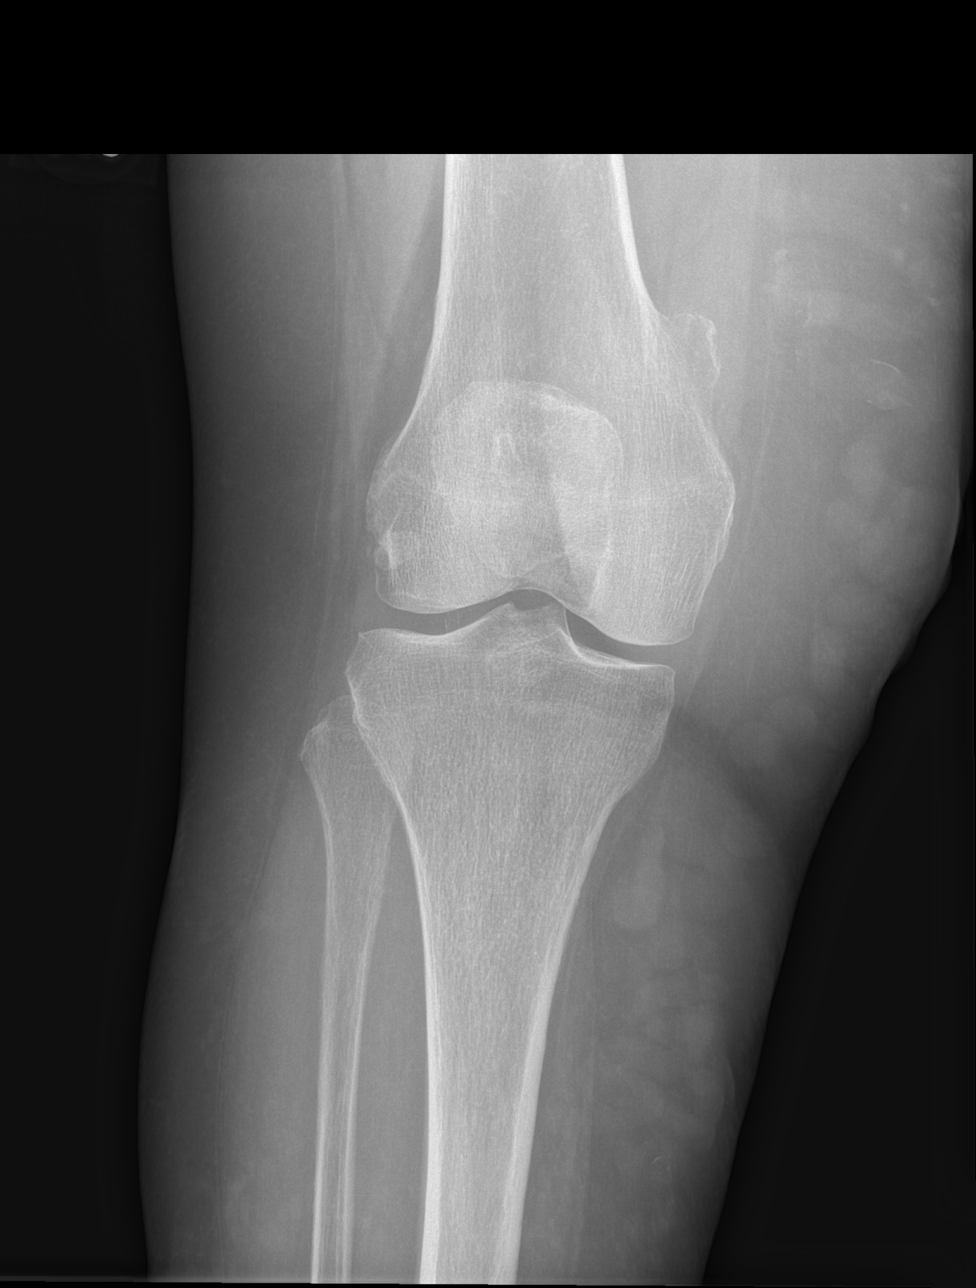

[t knee obl right (2 of 2)]
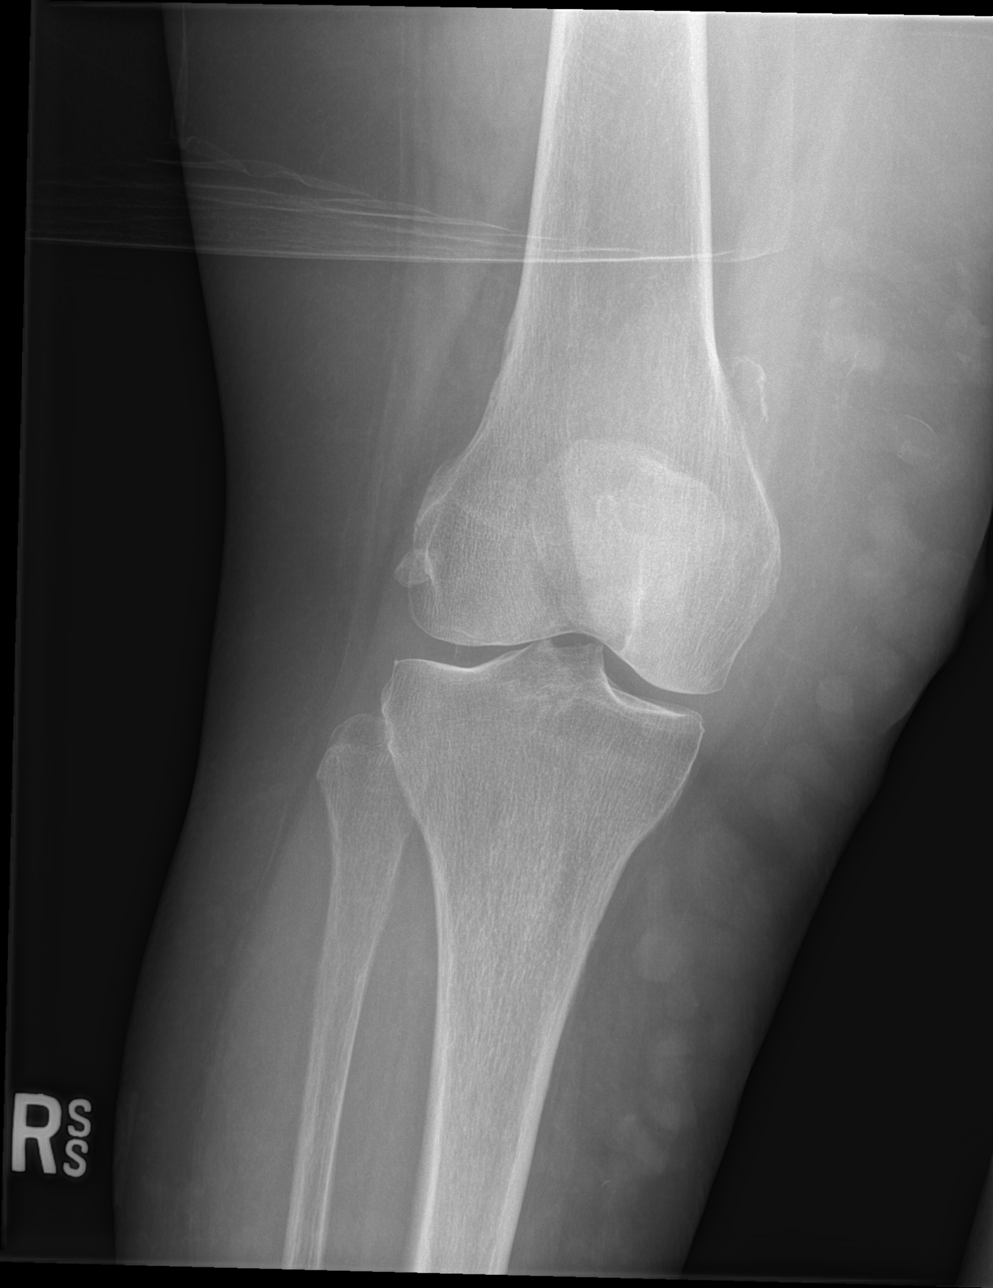

[x knee lat right]
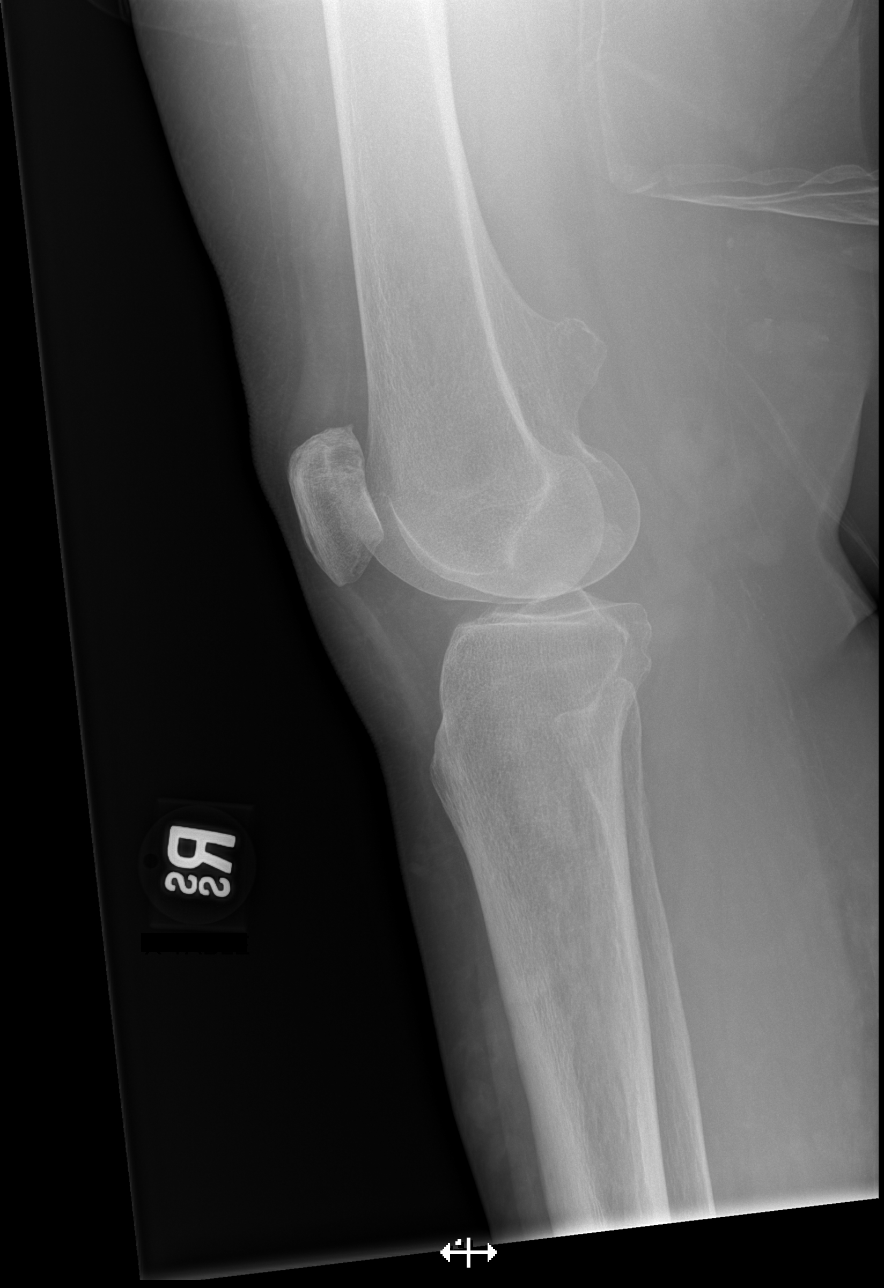

[4 of 4 positions shown; findings below may reference images not displayed]

FINDINGS: There is no acute fracture or dislocation. The bones are osteopenic.
A rounded osseous density in the posterior aspect of the lateral
knee most likely represents an accessory ossicle (fabella). A small
exostosis noted from the medial aspect of the distal tibia. No joint
effusion. Dilated an enlarged varicose veins noted in the
superficial soft tissues of the medial aspect of the thigh and leg.
IMPRESSION: No fracture or dislocation.

## 2015-07-09 NOTE — Discharge Instructions (Signed)
1. Medications: tylenol or ibuprofen for pain, usual home medications 2. Treatment: rest, drink plenty of fluids, ice, elevate, wear knee immobilizer 3. Follow Up: please followup with orthopedics for discussion of your diagnoses and further evaluation after today's visit; please return to the ER for increased pain, numbness, new or worsening symptoms   Knee Pain Knee pain is a common problem. It can have many causes. The pain often goes away by following your doctor's home care instructions. Treatment for ongoing pain will depend on the cause of your pain. If your knee pain continues, more tests may be needed to diagnose your condition. Tests may include X-rays or other imaging studies of your knee. HOME CARE  Take medicines only as told by your doctor.  Rest your knee and keep it raised (elevated) while you are resting.  Do not do things that cause pain or make your pain worse.  Avoid activities where both feet leave the ground at the same time, such as running, jumping rope, or doing jumping jacks.  Apply ice to the knee area:  Put ice in a plastic bag.  Place a towel between your skin and the bag.  Leave the ice on for 20 minutes, 2-3 times a day.  Ask your doctor if you should wear an elastic knee support.  Sleep with a pillow under your knee.  Lose weight if you are overweight. Being overweight can make your knee hurt more.  Do not use any tobacco products, including cigarettes, chewing tobacco, or electronic cigarettes. If you need help quitting, ask your doctor. Smoking may slow the healing of any bone and joint problems that you may have. GET HELP IF:  Your knee pain does not stop, it changes, or it gets worse.  You have a fever along with knee pain.  Your knee gives out or locks up.  Your knee becomes more swollen. GET HELP RIGHT AWAY IF:   Your knee feels hot to the touch.  You have chest pain or trouble breathing.   This information is not intended to  replace advice given to you by your health care provider. Make sure you discuss any questions you have with your health care provider.   Document Released: 06/28/2008 Document Revised: 04/22/2014 Document Reviewed: 06/02/2013 Elsevier Interactive Patient Education [employer] Inc[employer]

## 2015-07-09 NOTE — ED Notes (Signed)
Per EMS pt c/o right knee pain x 3 days.  No injury.  No swelling noted per EMS.

## 2015-07-09 NOTE — ED Provider Notes (Signed)
CSN: [id number]     Arrival date & time 07/09/15  [id number] History   First MD Initiated Contact with Patient 07/09/15 [phone]     Chief Complaint  Patient presents with  . Knee Pain    HPI   Victoria GardenerSandra Tyler is a 75 y.o. female with a PMH of DM who presents to the ED with right anterior knee pain, which she states started 3 days ago and has been constant since that time. She reports movement and bearing weight exacerbate her pain. She states she has tried ibuprofen at home with no significant symptom relief. She denies numbness, weakness, paresthesia, edema, erythema, history of DVT. She denies recent injury.   Past Medical History  Diagnosis Date  . Diabetes mellitus without complication [facility](HCC)    Past Surgical History  Procedure Laterality Date  . Hernia repair    . Tonsillectomy     Family History  Problem Relation Age of Onset  . Heart attack Father    Social History  Substance Use Topics  . Smoking status: Never Smoker   . Smokeless tobacco: None  . Alcohol Use: No   OB History    No data available      Review of Systems  Musculoskeletal: Positive for arthralgias. Negative for joint swelling.  Skin: Negative for color change.      Allergies  Ciprofloxacin  Home Medications   Prior to Admission medications   Medication Sig Start Date End Date Taking? Authorizing Provider  alendronate (FOSAMAX) 70 MG tablet Take 70 mg by mouth once a week.  03/24/14   Historical Provider, MD  beta carotene w/minerals (OCUVITE) tablet Take 1 tablet by mouth daily.    Historical Provider, MD  ibuprofen (ADVIL,MOTRIN) 800 MG tablet Take 800 mg by mouth every 8 (eight) hours as needed for mild pain.    Historical Provider, MD  insulin NPH Human (HUMULIN N,NOVOLIN N) 100 UNIT/ML injection Inject 22 Units into the skin 2 (two) times daily before a meal.     Historical Provider, MD  levofloxacin (LEVAQUIN) 500 MG tablet Take 1 tablet (500 mg total) by mouth daily. 04/26/14   Jerald KiefStephen K Chiu, MD   lisinopril-hydrochlorothiazide (PRINZIDE,ZESTORETIC) 20-12.5 MG per tablet Take 1 tablet by mouth daily.  03/15/14   Historical Provider, MD  metFORMIN (GLUCOPHAGE) 500 MG tablet Take 500 mg by mouth 2 (two) times daily with a meal.    Historical Provider, MD  metroNIDAZOLE (FLAGYL) 500 MG tablet Take 1 tablet (500 mg total) by mouth every 8 (eight) hours. 04/26/14   Jerald KiefStephen K Chiu, MD  omeprazole (PRILOSEC) 40 MG capsule Take 40 mg by mouth daily.    Historical Provider, MD  trimethoprim (TRIMPEX) 100 MG tablet Take 100 mg by mouth daily.    Historical Provider, MD    BP 128/57 mmHg  Pulse 77  Temp(Src) 98.7 F (37.1 C) (Oral)  Resp 20  SpO2 100% Physical Exam  Constitutional: She is oriented to person, place, and time. She appears well-developed and well-nourished. No distress.  HENT:  Head: Normocephalic and atraumatic.  Right Ear: External ear normal.  Left Ear: External ear normal.  Nose: Nose normal.  Eyes: Conjunctivae and EOM are normal. Right eye exhibits no discharge. Left eye exhibits no discharge. No scleral icterus.  Neck: Normal range of motion. Neck supple.  Cardiovascular: Normal rate, regular rhythm and intact distal pulses.   Pulmonary/Chest: Effort normal and breath sounds normal. No respiratory distress.  Musculoskeletal: Normal range of motion. She exhibits tenderness.  She exhibits no edema.  Mild TTP to medial aspect of right anterior knee. Patient able to straight leg raise RLE. Pain with ROM. No erythema, edema, or heat. No TTP to calf. Superficial varicosities present in lower extremities bilaterally.  Neurological: She is alert and oriented to person, place, and time. She has normal strength. No sensory deficit.  Skin: Skin is warm and dry. She is not diaphoretic. No erythema.  Psychiatric: She has a normal mood and affect. Her behavior is normal.  Nursing note and vitals reviewed.   ED Course  Procedures (including critical care time)  Labs Review Labs  Reviewed - No data to display  Imaging Review Dg Knee Complete 4 Views Right  07/09/2015  CLINICAL DATA:  75 year old female with right knee pain. EXAM: RIGHT KNEE - COMPLETE 4+ VIEW COMPARISON:  None. FINDINGS: There is no acute fracture or dislocation. The bones are osteopenic. A rounded osseous density in the posterior aspect of the lateral knee most likely represents an accessory ossicle (fabella). A small exostosis noted from the medial aspect of the distal tibia. No joint effusion. Dilated an enlarged varicose veins noted in the superficial soft tissues of the medial aspect of the thigh and leg. IMPRESSION: No fracture or dislocation. Electronically Signed   By: Elgie Collard M.D.   On: 07/09/2015 06:23   I have personally reviewed and evaluated these images as part of my medical decision-making.   EKG Interpretation None      MDM   Final diagnoses:  Right knee pain    75 year old female presents with right knee pain x 3 days. Denies fever, chills, erythema, edema, numbness, weakness, paresthesia, recent injury. Patient is afebrile. Vital signs stable. On exam, patient has mild TTP to the medial aspect of her right anterior knee. Patient able to straight leg raise RLE. Pain with ROM. No erythema, edema, or heat. No TTP to calf. Superficial varicosities present in lower extremities bilaterally. Patient is NVI. Imaging negative for fracture, dislocation, effusion. Patient discussed with and seen by Dr. Lynelle Doctor. Will place in knee immobilizer. Advised RICE and to follow-up with ortho. Return precautions discussed. Patient verbalizes her understanding and is in agreement with plan.  BP 128/57 mmHg  Pulse 77  Temp(Src) 98.7 F (37.1 C) (Oral)  Resp 20  SpO2 100%    [name], PA-C 07/09/15 0730  [name], MD 07/09/15 0830

## 2015-07-09 NOTE — ED Provider Notes (Addendum)
Patient states this is the third day she has had pain in her right knee. She denies any change in activity or injury. He has no prior history of knee problems. She indicates to me she's having pain over her medial knee. She denies feeling knee is swollen. She does not have any calf pain. She can bear weight on that leg without pain but when she starts to swing the knee to walk it is painful.   Patient is alert and cooperative. She has no obvious joint effusion. She is nontender to palpation over the patellar tendon or the patella. For me her pain is over the medial joint space of the knee. There is no redness or warmth the area. Her calf is soft and supple and nontender. She is noted to have multiple superficial varicosities in both her lower legs.  Medical screening examination/treatment/procedure(s) were conducted as a shared visit with non-physician practitioner(s) and myself.  I personally evaluated the patient during the encounter.     Devoria AlbeIva Isaul Landi, MD, Concha PyoFACEP   Aseret Hoffman, MD 07/09/15 [id number]  Devoria AlbeIva Blessed Girdner, MD 07/09/15 [phone]

## 2015-07-11 DIAGNOSIS — M25561 Pain in right knee: Secondary | ICD-10-CM | POA: Diagnosis not present

## 2015-07-17 DIAGNOSIS — M25561 Pain in right knee: Secondary | ICD-10-CM | POA: Diagnosis not present

## 2015-07-18 DIAGNOSIS — M25561 Pain in right knee: Secondary | ICD-10-CM | POA: Diagnosis not present

## 2015-07-25 DIAGNOSIS — M25561 Pain in right knee: Secondary | ICD-10-CM | POA: Diagnosis not present

## 2015-08-28 DIAGNOSIS — E119 Type 2 diabetes mellitus without complications: Secondary | ICD-10-CM | POA: Diagnosis not present

## 2015-08-28 DIAGNOSIS — I10 Essential (primary) hypertension: Secondary | ICD-10-CM | POA: Diagnosis not present

## 2015-08-28 DIAGNOSIS — Z794 Long term (current) use of insulin: Secondary | ICD-10-CM | POA: Diagnosis not present

## 2015-09-26 DIAGNOSIS — E871 Hypo-osmolality and hyponatremia: Secondary | ICD-10-CM | POA: Diagnosis not present

## 2015-10-30 ENCOUNTER — Emergency Department (HOSPITAL_COMMUNITY): Payer: PPO

## 2015-10-30 ENCOUNTER — Observation Stay (HOSPITAL_COMMUNITY)
Admission: EM | Admit: 2015-10-30 | Discharge: 2015-10-31 | Disposition: A | Discharge status: Home / Self Care | Payer: PPO | Attending: Internal Medicine | Admitting: Internal Medicine

## 2015-10-30 ENCOUNTER — Observation Stay (HOSPITAL_COMMUNITY): Payer: PPO

## 2015-10-30 ENCOUNTER — Encounter (HOSPITAL_COMMUNITY): Payer: Self-pay | Admitting: Emergency Medicine

## 2015-10-30 DIAGNOSIS — E119 Type 2 diabetes mellitus without complications: Secondary | ICD-10-CM | POA: Diagnosis not present

## 2015-10-30 DIAGNOSIS — E162 Hypoglycemia, unspecified: Secondary | ICD-10-CM | POA: Diagnosis present

## 2015-10-30 DIAGNOSIS — S83001A Unspecified subluxation of right patella, initial encounter: Secondary | ICD-10-CM

## 2015-10-30 DIAGNOSIS — Z6841 Body Mass Index (BMI) 40.0 and over, adult: Secondary | ICD-10-CM | POA: Insufficient documentation

## 2015-10-30 DIAGNOSIS — E860 Dehydration: Secondary | ICD-10-CM | POA: Diagnosis not present

## 2015-10-30 DIAGNOSIS — Z79899 Other long term (current) drug therapy: Secondary | ICD-10-CM | POA: Diagnosis not present

## 2015-10-30 DIAGNOSIS — S8001XA Contusion of right knee, initial encounter: Secondary | ICD-10-CM | POA: Diagnosis not present

## 2015-10-30 DIAGNOSIS — S0083XA Contusion of other part of head, initial encounter: Secondary | ICD-10-CM | POA: Diagnosis not present

## 2015-10-30 DIAGNOSIS — E11649 Type 2 diabetes mellitus with hypoglycemia without coma: Principal | ICD-10-CM | POA: Insufficient documentation

## 2015-10-30 DIAGNOSIS — Z794 Long term (current) use of insulin: Secondary | ICD-10-CM | POA: Diagnosis not present

## 2015-10-30 DIAGNOSIS — W19XXXA Unspecified fall, initial encounter: Secondary | ICD-10-CM | POA: Diagnosis not present

## 2015-10-30 DIAGNOSIS — S83011A Lateral subluxation of right patella, initial encounter: Secondary | ICD-10-CM | POA: Insufficient documentation

## 2015-10-30 DIAGNOSIS — N179 Acute kidney failure, unspecified: Secondary | ICD-10-CM

## 2015-10-30 DIAGNOSIS — I10 Essential (primary) hypertension: Secondary | ICD-10-CM | POA: Diagnosis not present

## 2015-10-30 DIAGNOSIS — Z7982 Long term (current) use of aspirin: Secondary | ICD-10-CM | POA: Insufficient documentation

## 2015-10-30 DIAGNOSIS — I951 Orthostatic hypotension: Secondary | ICD-10-CM | POA: Insufficient documentation

## 2015-10-30 DIAGNOSIS — S0003XA Contusion of scalp, initial encounter: Secondary | ICD-10-CM | POA: Insufficient documentation

## 2015-10-30 DIAGNOSIS — M25569 Pain in unspecified knee: Secondary | ICD-10-CM

## 2015-10-30 DIAGNOSIS — S0990XA Unspecified injury of head, initial encounter: Secondary | ICD-10-CM | POA: Diagnosis not present

## 2015-10-30 LAB — CBC WITH DIFFERENTIAL/PLATELET
Basophils Absolute: 0 10*3/uL (ref 0.0–0.1)
Basophils Relative: 0 %
EOS ABS: 0.1 10*3/uL (ref 0.0–0.7)
Eosinophils Relative: 1 %
HCT: 33.5 % — ABNORMAL LOW (ref 36.0–46.0)
Hemoglobin: 11.5 g/dL — ABNORMAL LOW (ref 12.0–15.0)
LYMPHS ABS: 1.2 10*3/uL (ref 0.7–4.0)
Lymphocytes Relative: 12 %
MCH: 31.9 pg (ref 26.0–34.0)
MCHC: 34.3 g/dL (ref 30.0–36.0)
MCV: 93.1 fL (ref 78.0–100.0)
MONO ABS: 0.7 10*3/uL (ref 0.1–1.0)
MONOS PCT: 7 %
Neutro Abs: 8.2 10*3/uL — ABNORMAL HIGH (ref 1.7–7.7)
Neutrophils Relative %: 80 %
Platelets: 287 10*3/uL (ref 150–400)
RBC: 3.6 MIL/uL — ABNORMAL LOW (ref 3.87–5.11)
RDW: 14.4 % (ref 11.5–15.5)
WBC: 10.2 10*3/uL (ref 4.0–10.5)

## 2015-10-30 LAB — COMPREHENSIVE METABOLIC PANEL
ALBUMIN: 3.7 g/dL (ref 3.5–5.0)
ALT: 16 U/L (ref 14–54)
AST: 28 U/L (ref 15–41)
Alkaline Phosphatase: 46 U/L (ref 38–126)
Anion gap: 9 (ref 5–15)
BUN: 22 mg/dL — ABNORMAL HIGH (ref 6–20)
CO2: 19 mmol/L — ABNORMAL LOW (ref 22–32)
Calcium: 8.9 mg/dL (ref 8.9–10.3)
Chloride: 107 mmol/L (ref 101–111)
Creatinine, Ser: 1.31 mg/dL — ABNORMAL HIGH (ref 0.44–1.00)
GFR calc non Af Amer: 39 mL/min — ABNORMAL LOW (ref >60)
GFR, EST AFRICAN AMERICAN: 45 mL/min — AB (ref >60)
GLUCOSE: 184 mg/dL — AB (ref 65–99)
POTASSIUM: 4.6 mmol/L (ref 3.5–5.1)
Sodium: 135 mmol/L (ref 135–145)
Total Bilirubin: 1 mg/dL (ref 0.3–1.2)
Total Protein: 6.7 g/dL (ref 6.5–8.1)

## 2015-10-30 LAB — URINALYSIS, ROUTINE W REFLEX MICROSCOPIC
BILIRUBIN URINE: NEGATIVE
Glucose, UA: NEGATIVE mg/dL
HGB URINE DIPSTICK: NEGATIVE
KETONES UR: 15 mg/dL — AB
NITRITE: NEGATIVE
PH: 5.5 (ref 5.0–8.0)
Protein, ur: NEGATIVE mg/dL
Specific Gravity, Urine: 1.011 (ref 1.005–1.030)

## 2015-10-30 LAB — GLUCOSE, CAPILLARY
GLUCOSE-CAPILLARY: 163 mg/dL — AB (ref 65–99)
Glucose-Capillary: 70 mg/dL (ref 65–99)
Glucose-Capillary: 138 mg/dL — ABNORMAL HIGH (ref 65–99)

## 2015-10-30 LAB — CBG MONITORING, ED
GLUCOSE-CAPILLARY: 171 mg/dL — AB (ref 65–99)
Glucose-Capillary: 56 mg/dL — ABNORMAL LOW (ref 65–99)

## 2015-10-30 LAB — URINE MICROSCOPIC-ADD ON

## 2015-10-30 IMAGING — CT CT KNEE*R* W/O CM
4 of 6 series · 16 of 33 positions shown, 18 images · non-contrast
Comparison: [DATE]

CLINICAL DATA: Fall 2 days ago.  Lateral patellar subluxation.

EXAM:
CT OF THE right KNEE WITHOUT CONTRAST
TECHNIQUE: Multidetector CT imaging of the right knee was performed according
to the standard protocol. Multiplanar CT image reconstructions were
also generated.

[Series 3: knee st · axial · 0.47mm/px · z∈[-1058,-896]mm · 5 of 123 slices shown, 7 images]
[im 21/123  soft-tissue]
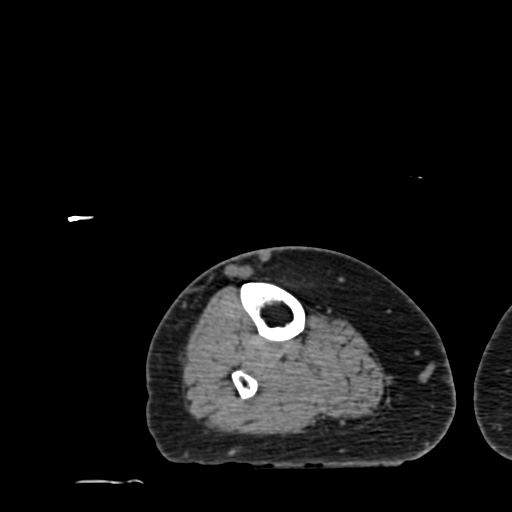
[im 21/123  bone]
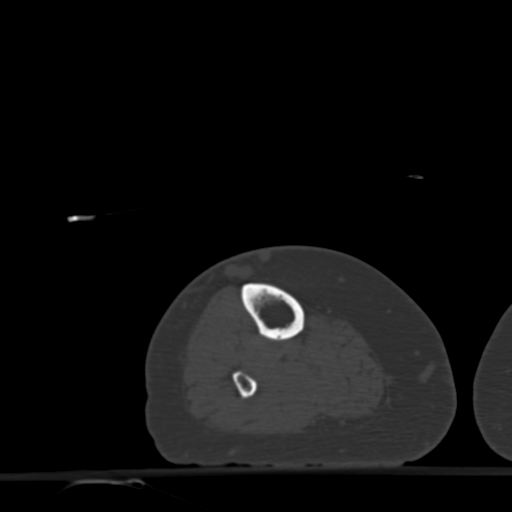
[im 41/123  bone]
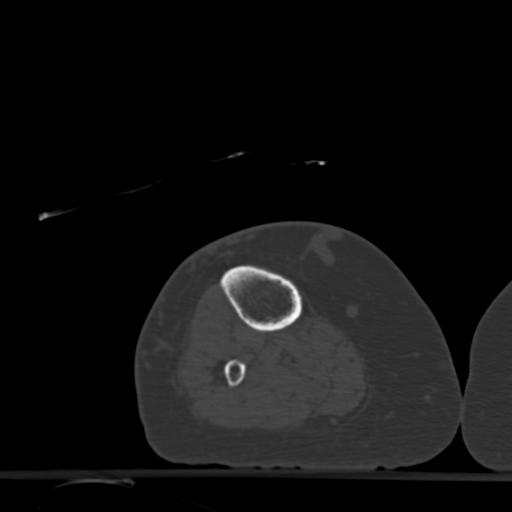
[im 62/123  bone]
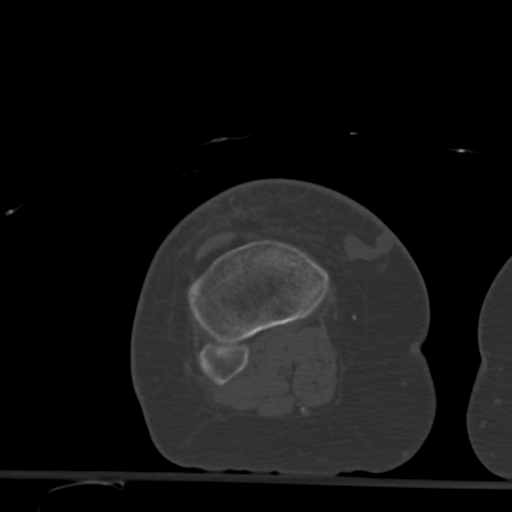
[im 82/123  bone]
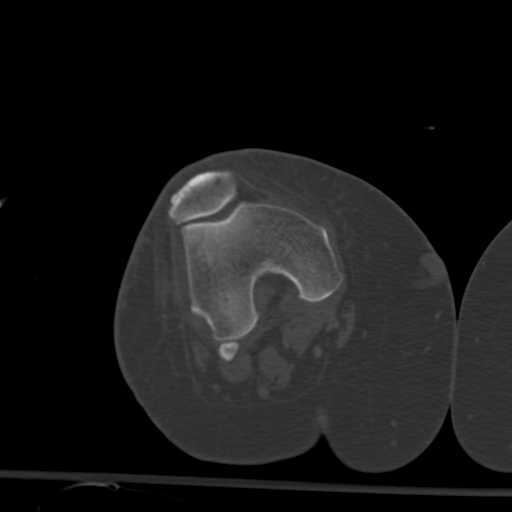
[im 102/123  soft-tissue]
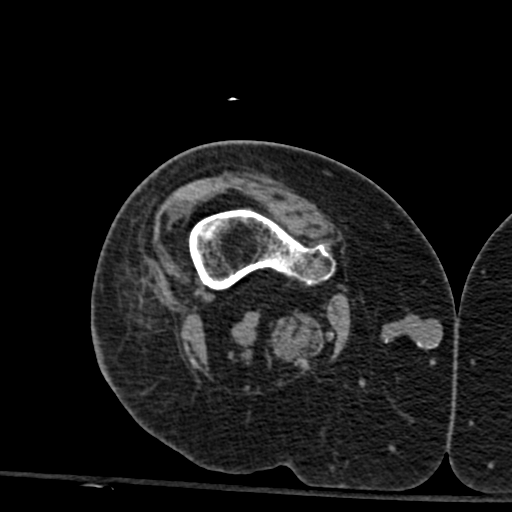
[im 102/123  bone]
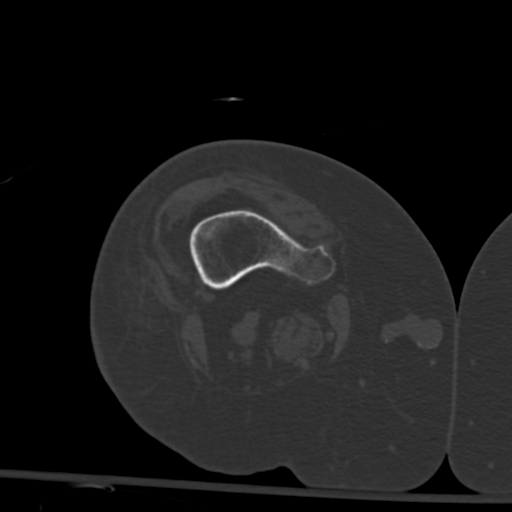

[Series 602: <mpr thick range> · sagittal · 0.48mm/px · 5 of 91 slices shown]
[im 16/91  bone]
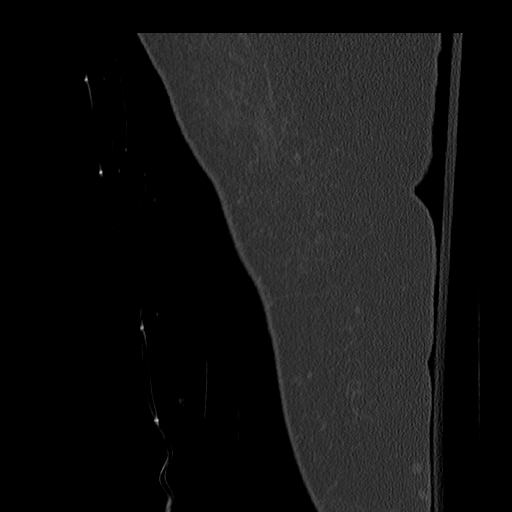
[im 31/91  bone]
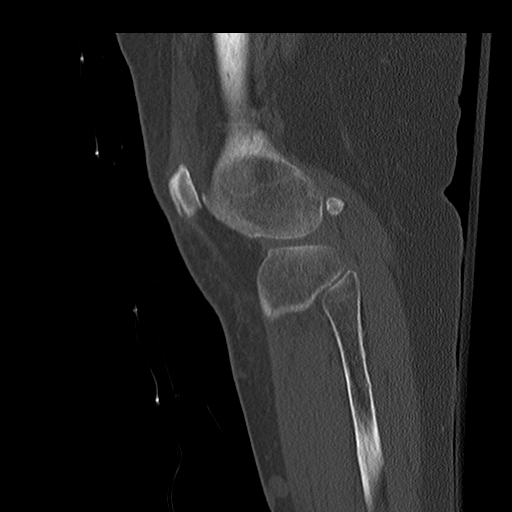
[im 46/91  bone]
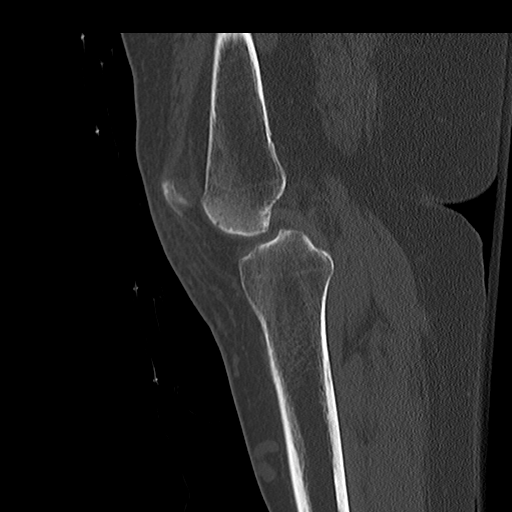
[im 61/91  bone]
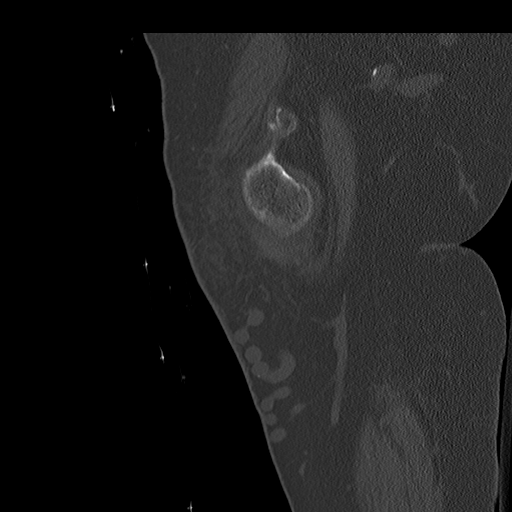
[im 76/91  bone]
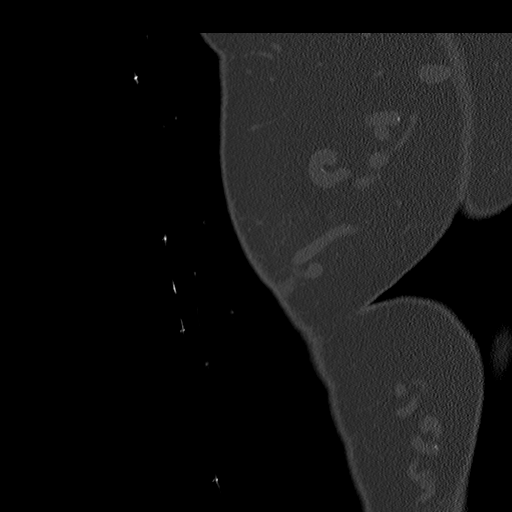

[Series 603: <mpr thick range(1)> · coronal · 0.48mm/px · 1 of 69 slices shown]
[im 35/69  bone]
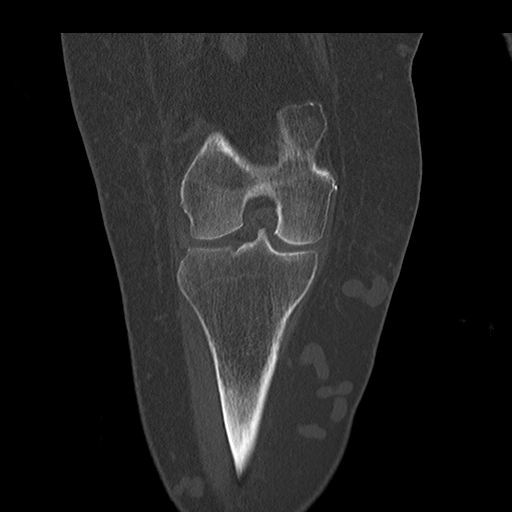

[Series 606: <mpr thick range(4)> · axial · 0.48mm/px · z∈[-1084,-926]mm · 5 of 123 slices shown]
[im 21/123  bone]
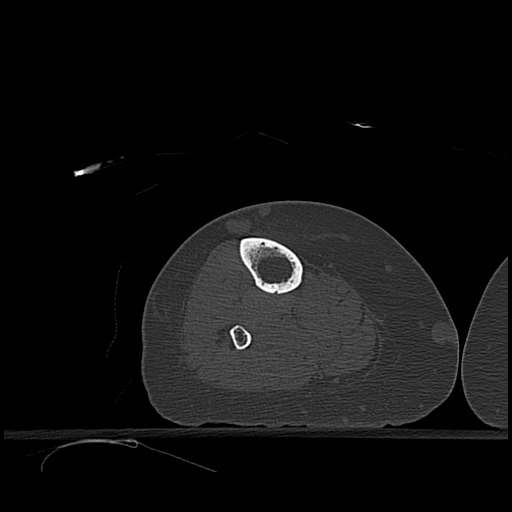
[im 41/123  bone]
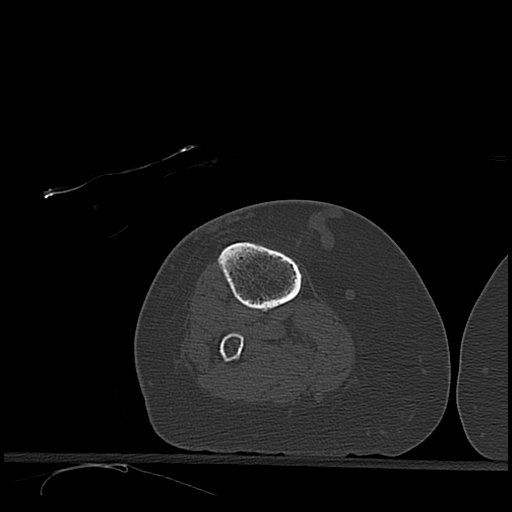
[im 62/123  bone]
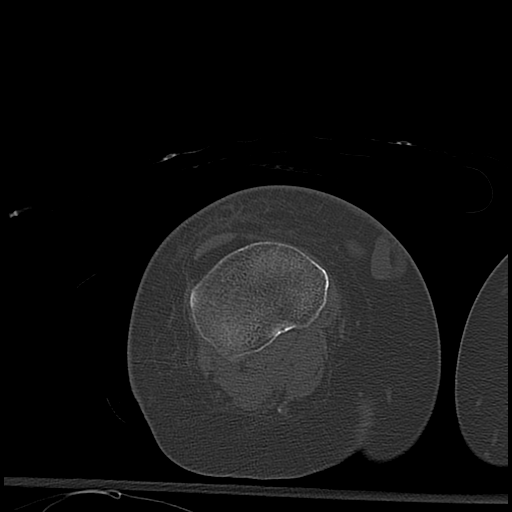
[im 82/123  bone]
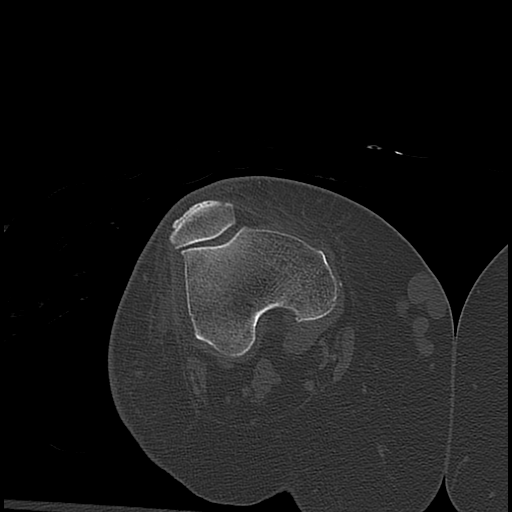
[im 102/123  bone]
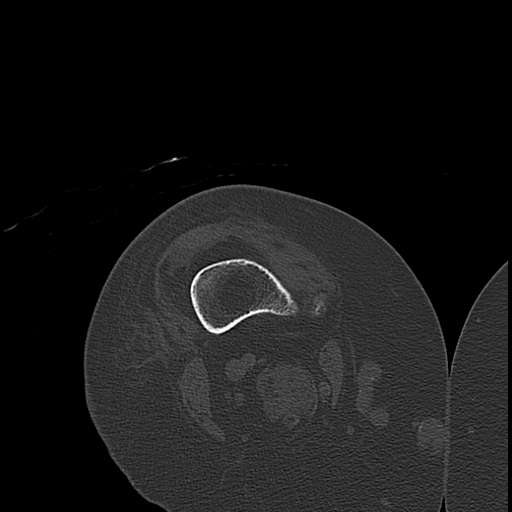

[16 of 33 positions shown; findings below may reference images not displayed]

FINDINGS: The patella is seated in the femoral trochlear groove and is not
dislocated or subluxed currently.

There is an osteochondroma of the distal femoral metaphysis medially
without a thick cartilaginous cap.

Small knee joint effusion. Focal subcutaneous edema anterior to the
patella, patellar tendon, and anterior to the patellar retinacula.

Medial subcutaneous venous varicosities.

There is a geode along the lateral patellar facet. Moderate to
prominent thinning of articular cartilage laterally in the
patellofemoral joint.

No obvious discontinuity of the quadriceps or patellar tendon.

No notable Baker's cyst.
IMPRESSION: 1. Subcutaneous edema anterior to the patellar tendon and patellar
retinacula.
2. No patellar subluxation observed. However, there are degenerative
findings laterally in the patellofemoral joint with considerable
loss of articular space.
3. Small knee joint effusion.
4. Distal osteochondroma of the medial femoral metaphysis without a
thick cartilaginous cap. Benign finding.

## 2015-10-30 IMAGING — CT CT HEAD W/O CM
3 of 4 series · 15 of 47 positions shown, 18 images · non-contrast
Comparison: None.

CLINICAL DATA: Initial evaluation for acute trauma, fall.

EXAM:
CT HEAD WITHOUT CONTRAST
TECHNIQUE: Contiguous axial images were obtained from the base of the skull
through the vertex without intravenous contrast.

[Series 2: head w/o · axial · non-contrast · 0.45mm/px · z∈[+1678,+1798]mm · 9 of 32 slices shown, 12 images]
[im 4/32  brain]
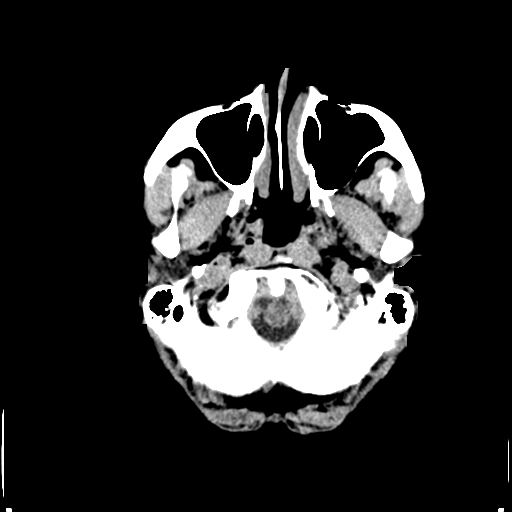
[im 4/32  bone]
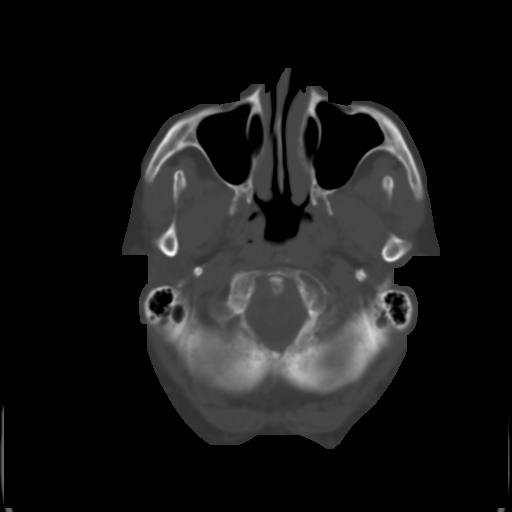
[im 7/32  brain]
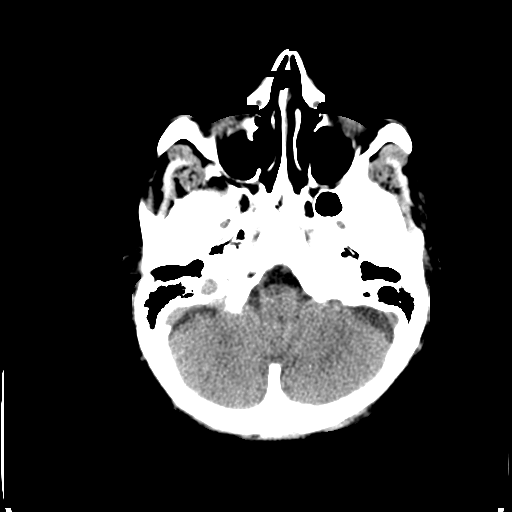
[im 10/32  brain]
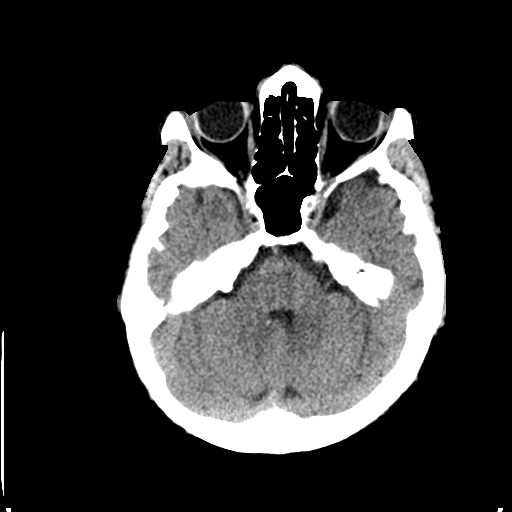
[im 13/32  brain]
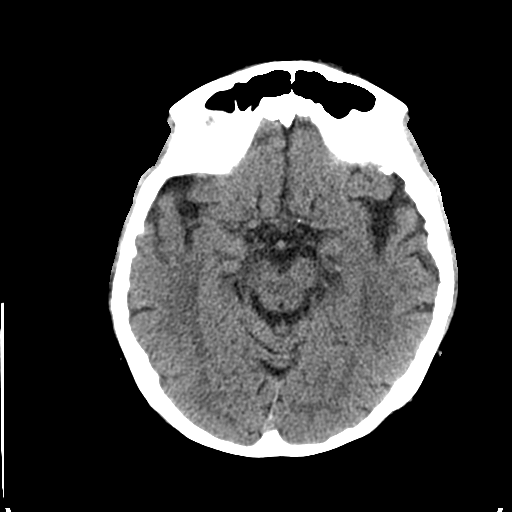
[im 16/32  brain]
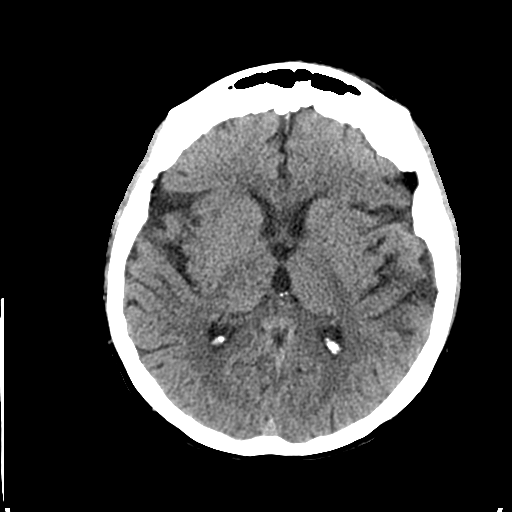
[im 16/32  bone]
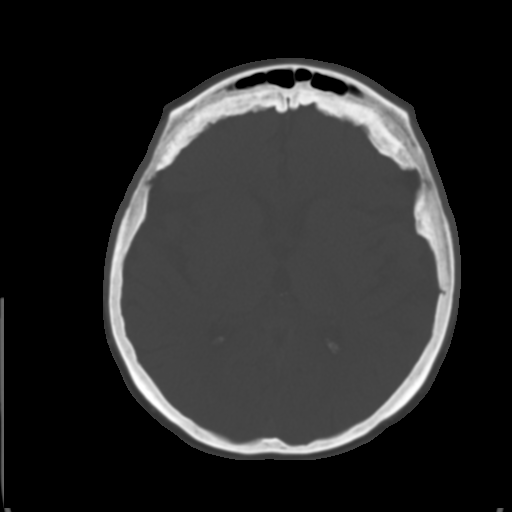
[im 19/32  brain]
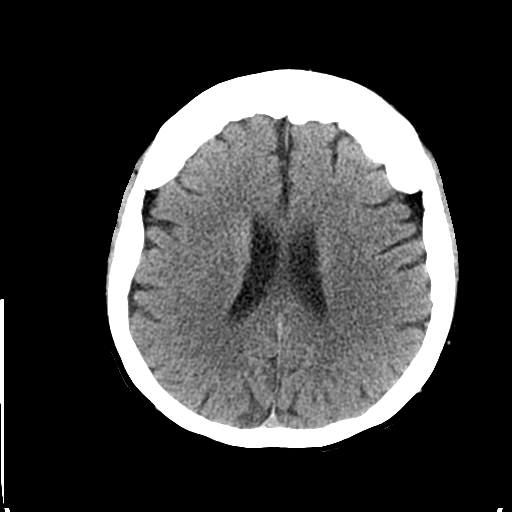
[im 22/32  brain]
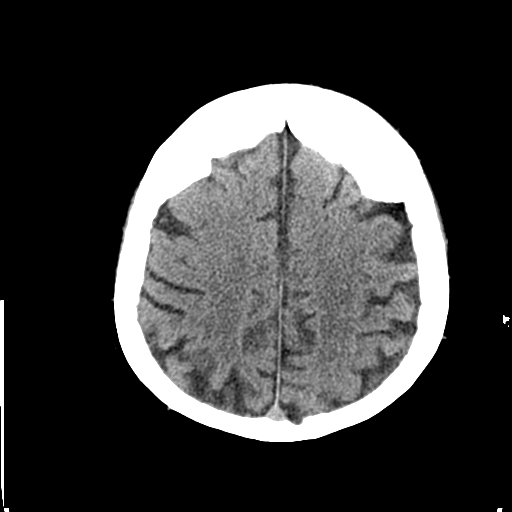
[im 25/32  brain]
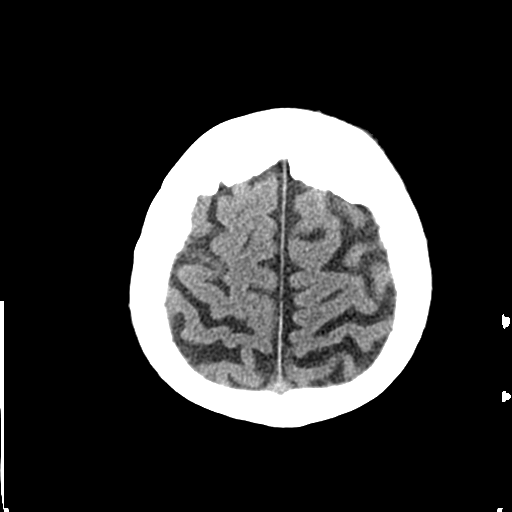
[im 28/32  brain]
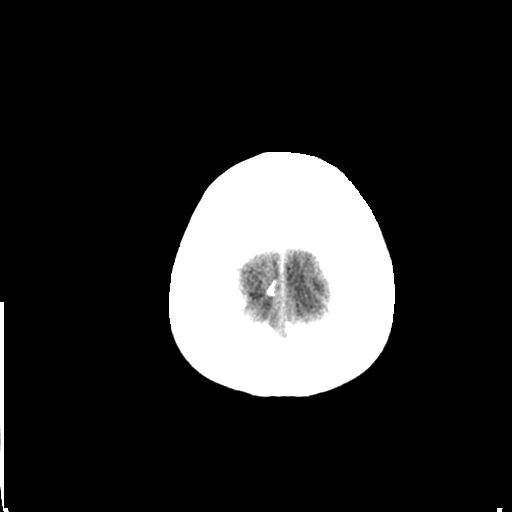
[im 28/32  bone]
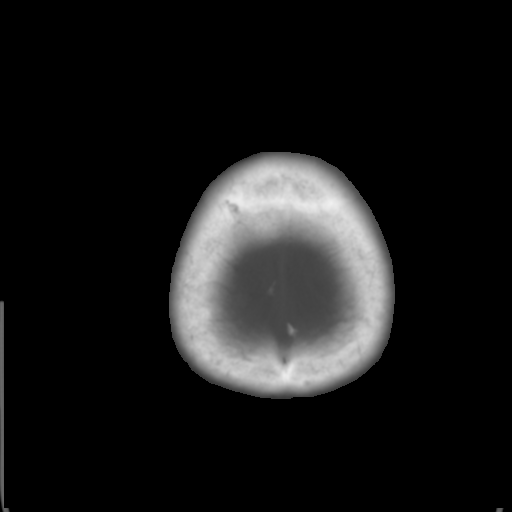

[Series 4: coronal · coronal · 0.30mm/px · 3 of 58 slices shown]
[im 20/58  brain]
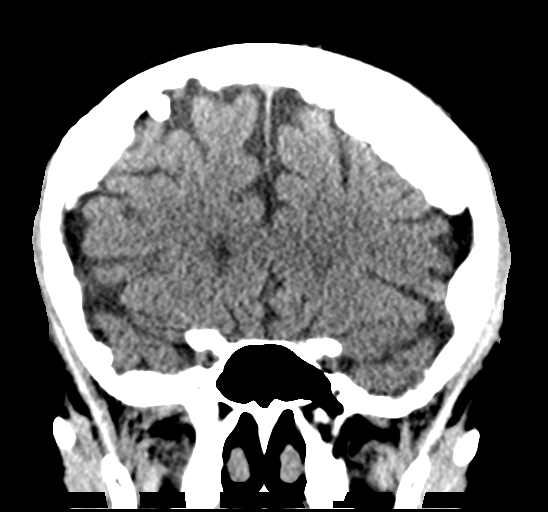
[im 26/58  brain]
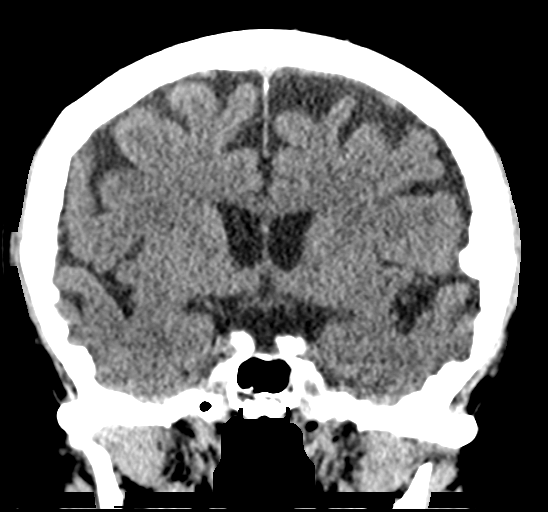
[im 32/58  brain]
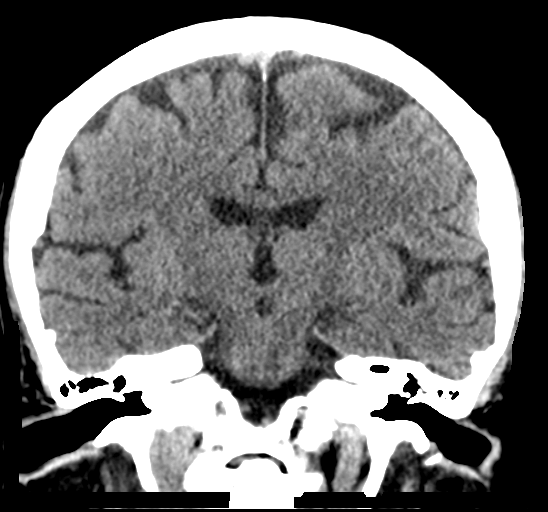

[Series 5: sagittal · sagittal · 0.30mm/px · 3 of 53 slices shown]
[im 18/53  brain]
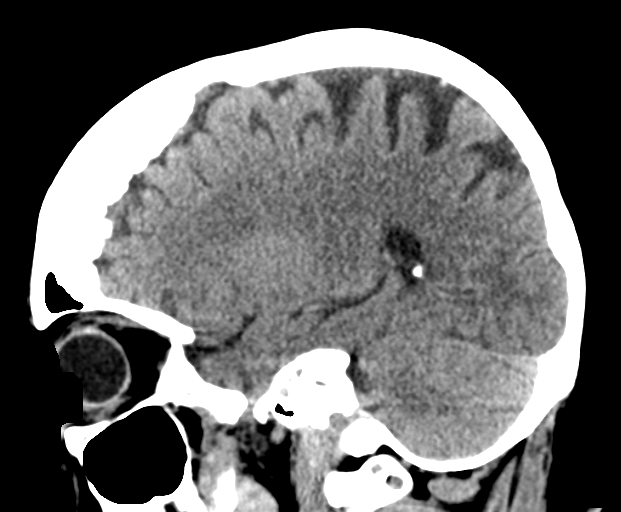
[im 27/53  brain]
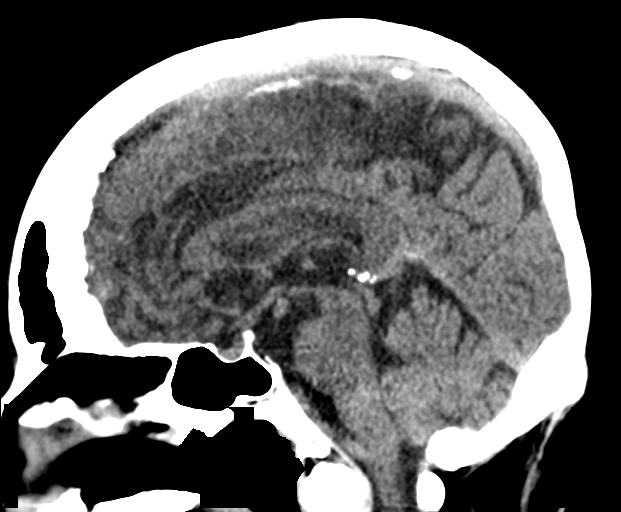
[im 35/53  brain]
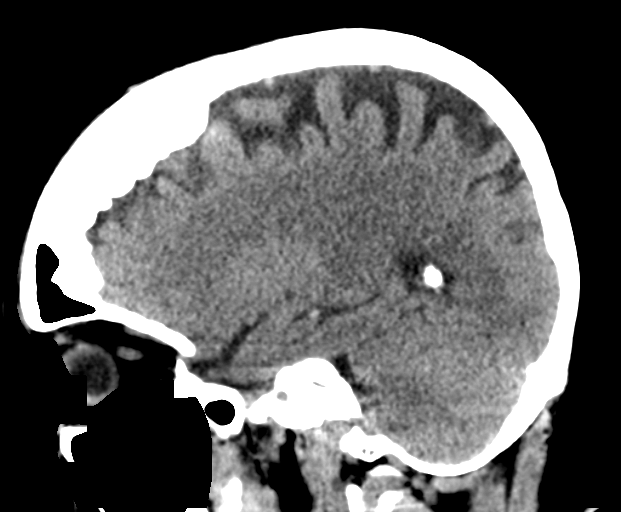

[15 of 47 positions shown; findings below may reference images not displayed]

FINDINGS: Small contusion at the left frontal scalp. Scalp soft tissues
otherwise unremarkable.

No acute abnormality about the globes and orbits.

Paranasal sinuses are clear. No mastoid effusion. Middle ear
cavities are clear.

Calvarium intact.  Hyperostosis frontalis interna noted.

Age-related cerebral atrophy present. Mild for age chronic small
vessel ischemic disease. No acute intracranial hemorrhage or
infarct. No mass lesion, midline shift, or mass effect. No
hydrocephalus. No extra-axial fluid collection.
IMPRESSION: 1. No acute intracranial process.
2. Small left frontal scalp contusion.

## 2015-10-30 IMAGING — CR DG KNEE COMPLETE 4+V*R*
4 series · 4 of 4 positions shown · non-contrast
Comparison: [DATE]

CLINICAL DATA: Pain following fall

EXAM:
RIGHT KNEE - COMPLETE 4+ VIEW

[x knee ap right (1 of 3)]
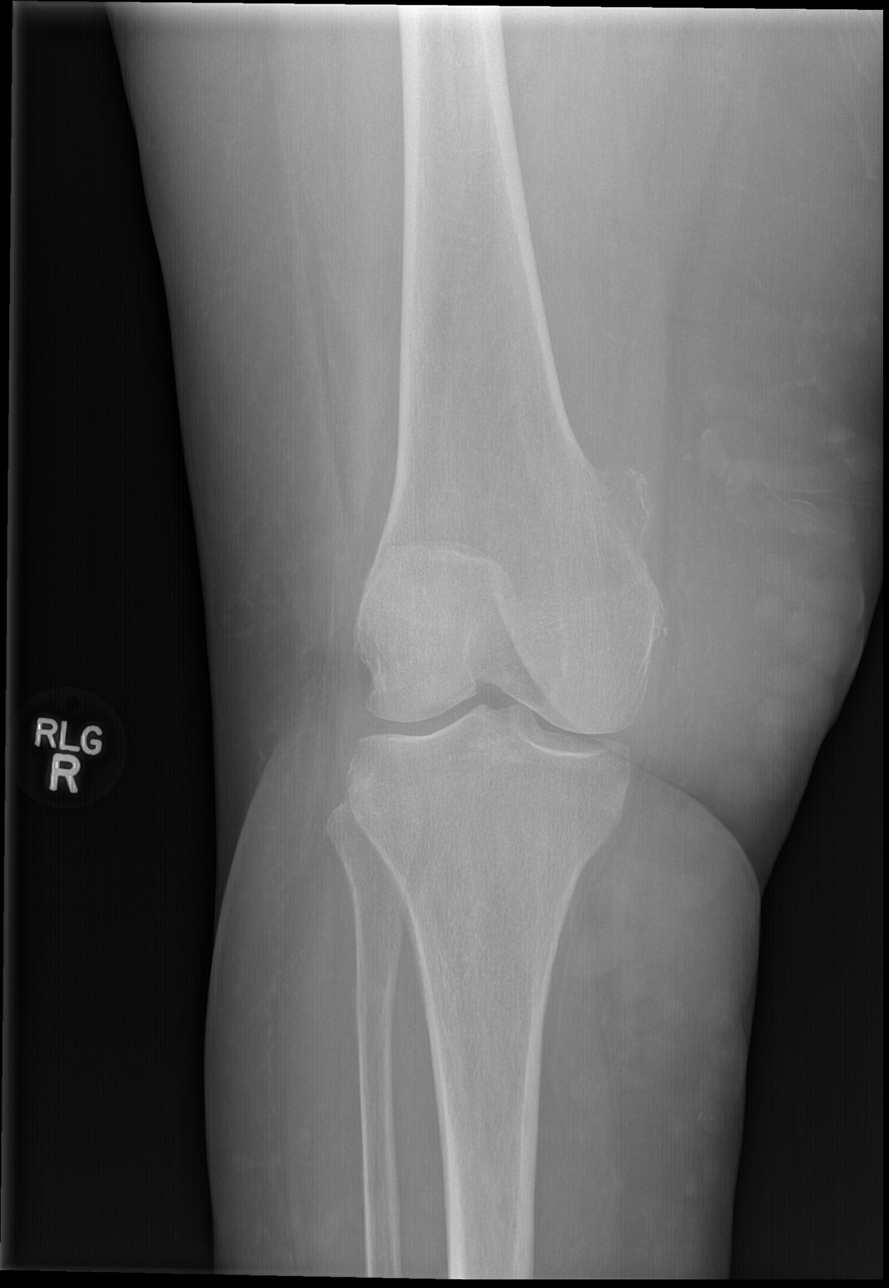

[x knee ap right (2 of 3)]
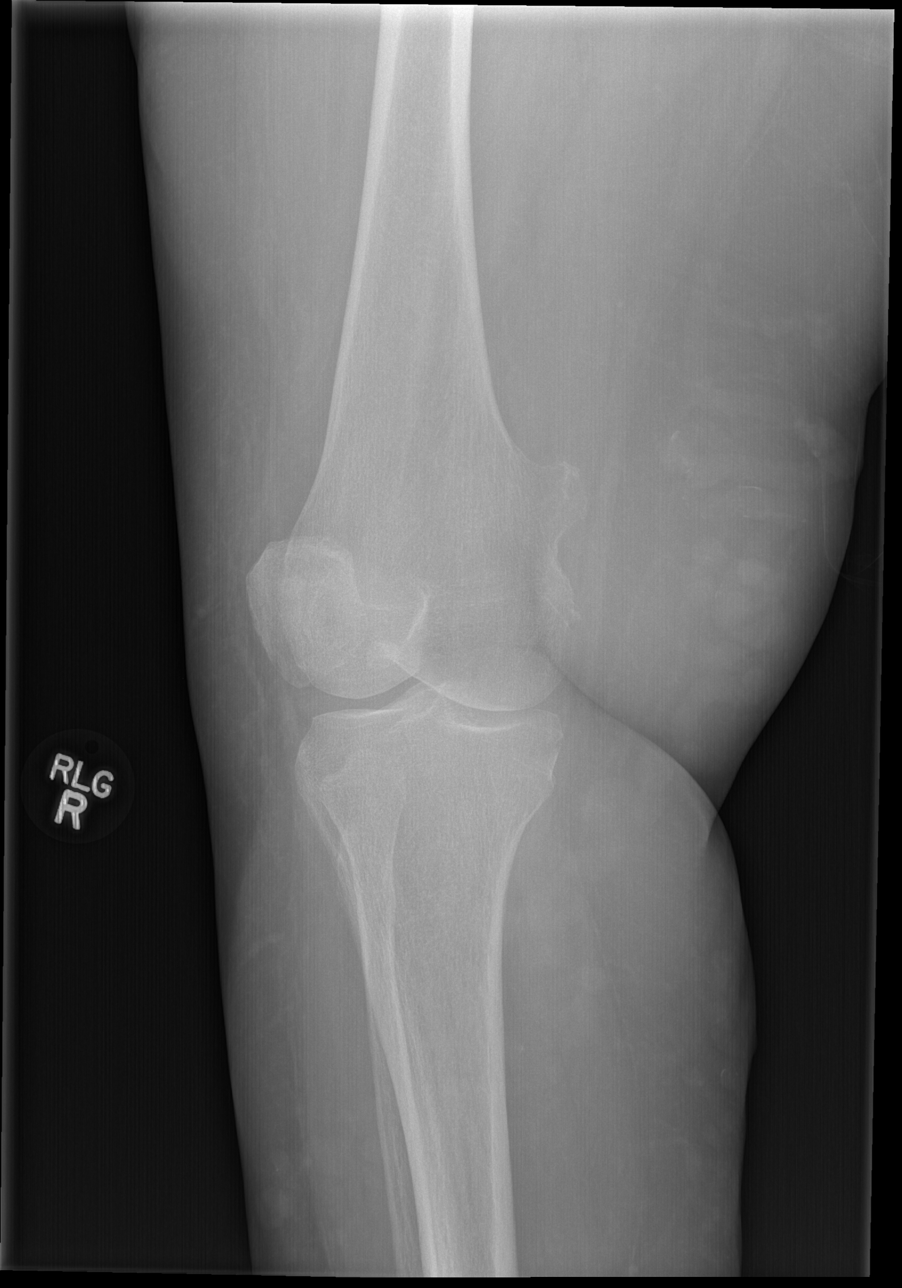

[x knee ap right (3 of 3)]
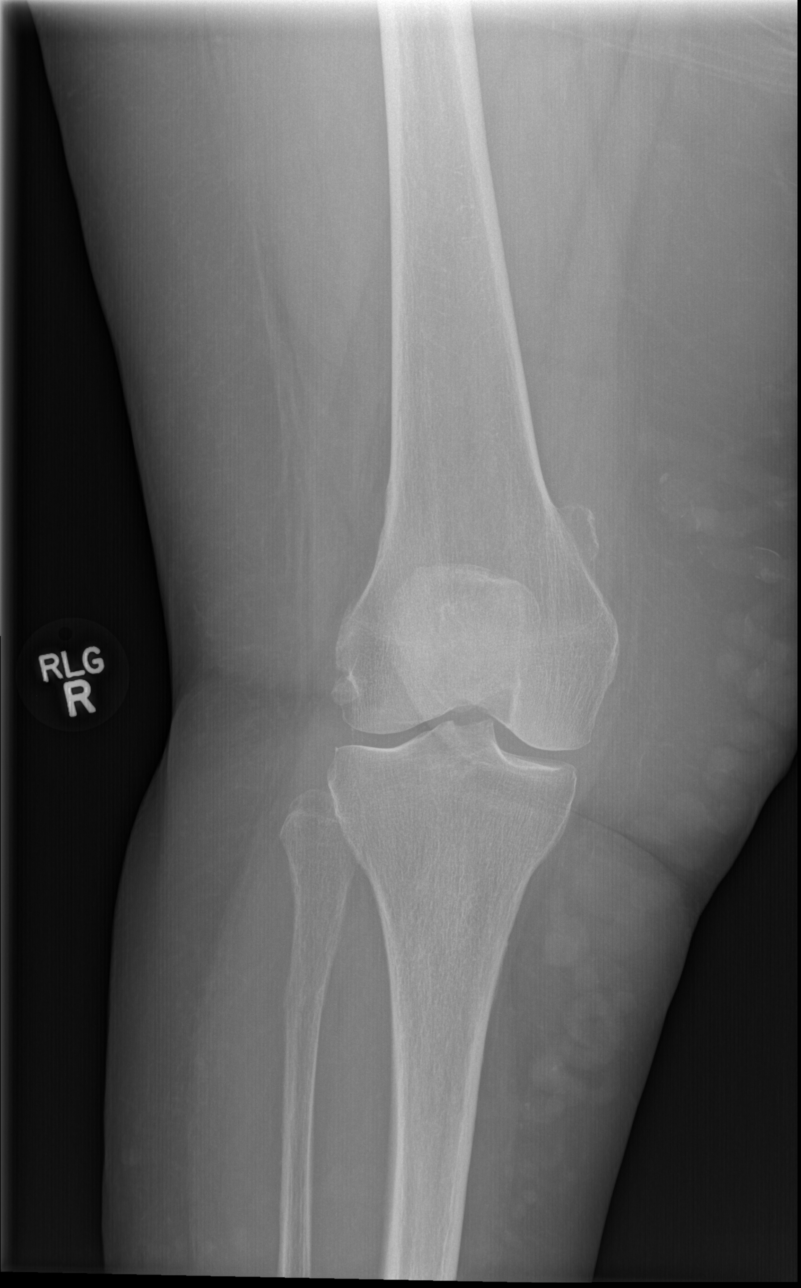

[x knee lat right]
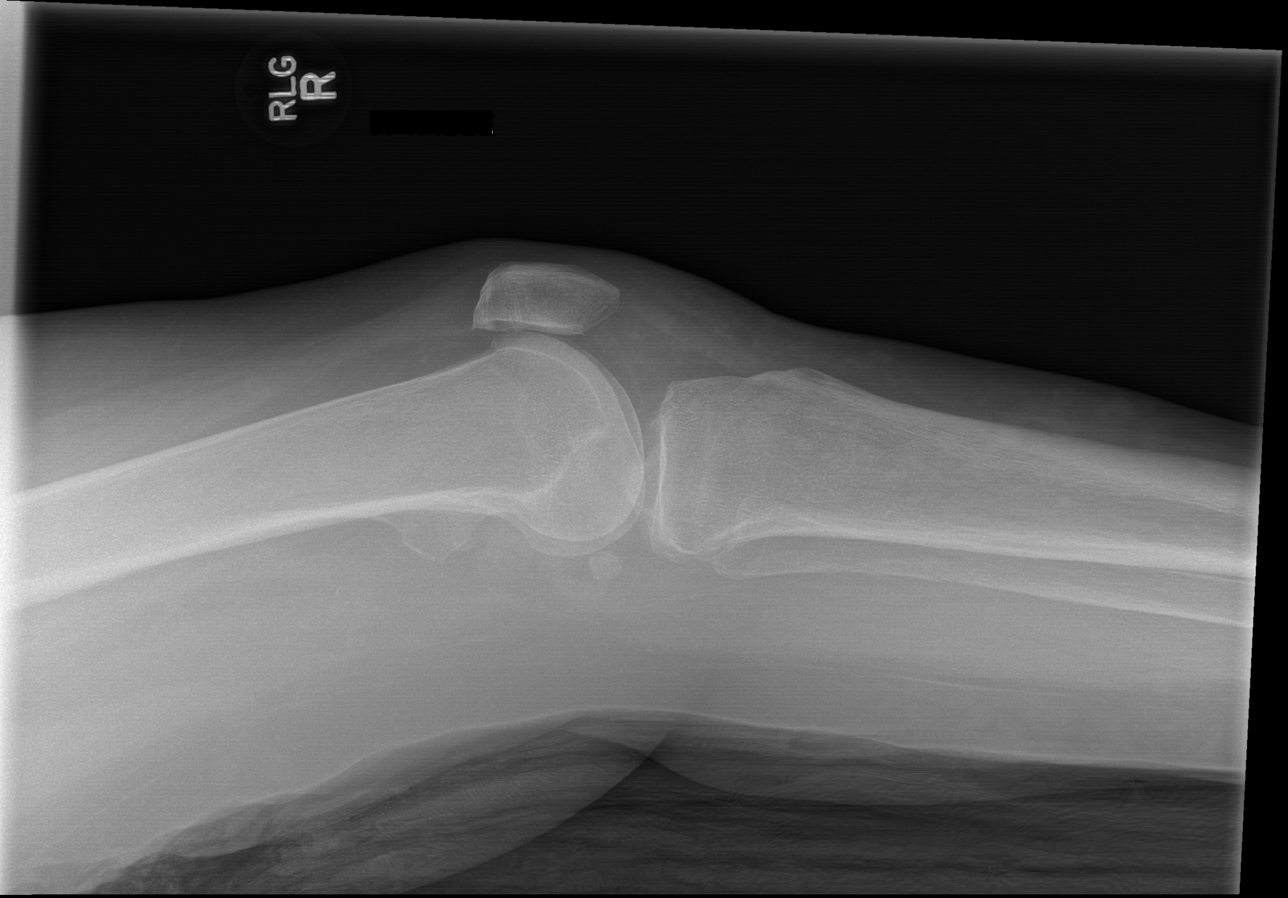

[4 of 4 positions shown; findings below may reference images not displayed]

FINDINGS: Frontal, lateral, and bilateral oblique views were obtained. There
is no fracture or dislocation. There is lateral patellar
subluxation. There is no appreciable joint effusion. There is a
benign exostosis arising from the medial posterior aspect of the
distal femoral metaphysis measuring 2.0 x 1.2 cm, stable. No erosive
change. There is mild joint space narrowing in the patellofemoral
joint. Other joint spaces appear normal.
IMPRESSION: Lateral patellar subluxation. No frank dislocation. No fracture or
joint effusion. Stable benign distal femoral exostosis. Mild
narrowing patellofemoral joint.

## 2015-10-30 MED ORDER — SODIUM CHLORIDE 0.9 % IV SOLN
INTRAVENOUS | Status: DC
  Administered 2015-10-30 – 2015-10-31 (×2): via INTRAVENOUS

## 2015-10-30 MED ORDER — ENOXAPARIN SODIUM 40 MG/0.4ML ~~LOC~~ SOLN
40.0000 mg | SUBCUTANEOUS | Status: DC
Start: 2015-10-30 — End: 2015-10-31
  Administered 2015-10-30: 40 mg via SUBCUTANEOUS
  Filled 2015-10-30: qty 0.4

## 2015-10-30 MED ORDER — SODIUM CHLORIDE 0.9 % IV BOLUS (SEPSIS)
1000.0000 mL | Freq: Once | INTRAVENOUS | Status: AC
  Administered 2015-10-30: 1000 mL via INTRAVENOUS

## 2015-10-30 MED ORDER — TRIMETHOPRIM 100 MG PO TABS
100.0000 mg | ORAL_TABLET | Freq: Every day | ORAL | Status: DC
  Administered 2015-10-30: 100 mg via ORAL
  Filled 2015-10-30 (×2): qty 1

## 2015-10-30 MED ORDER — KETOROLAC TROMETHAMINE 15 MG/ML IJ SOLN
7.5000 mg | Freq: Once | INTRAMUSCULAR | Status: AC
  Administered 2015-10-30: 7.5 mg via INTRAVENOUS
  Filled 2015-10-30: qty 1

## 2015-10-30 MED ORDER — SODIUM CHLORIDE 0.9% FLUSH
3.0000 mL | Freq: Two times a day (BID) | INTRAVENOUS | Status: DC
  Administered 2015-10-30 (×2): 3 mL via INTRAVENOUS

## 2015-10-30 MED ORDER — MORPHINE SULFATE (PF) 2 MG/ML IV SOLN
1.0000 mg | INTRAVENOUS | Status: DC | PRN
  Administered 2015-10-30: 1 mg via INTRAVENOUS
  Filled 2015-10-30: qty 1

## 2015-10-30 MED ORDER — INSULIN ASPART 100 UNIT/ML ~~LOC~~ SOLN: 0.0000 [IU] | Freq: Three times a day (TID) | SUBCUTANEOUS | Status: DC

## 2015-10-30 MED ORDER — ASPIRIN 81 MG PO CHEW
81.0000 mg | CHEWABLE_TABLET | Freq: Every day | ORAL | Status: DC
  Administered 2015-10-30: 81 mg via ORAL
  Filled 2015-10-30: qty 1

## 2015-10-30 MED ORDER — CHLORHEXIDINE GLUCONATE 0.12 % MT SOLN
15.0000 mL | Freq: Two times a day (BID) | OROMUCOSAL | Status: DC
  Administered 2015-10-30 (×2): 15 mL via OROMUCOSAL
  Filled 2015-10-30 (×2): qty 15

## 2015-10-30 MED ORDER — OXYBUTYNIN CHLORIDE 5 MG PO TABS
5.0000 mg | ORAL_TABLET | Freq: Two times a day (BID) | ORAL | Status: DC
  Administered 2015-10-30 (×2): 5 mg via ORAL
  Filled 2015-10-30 (×3): qty 1

## 2015-10-30 MED ORDER — OXYCODONE-ACETAMINOPHEN 5-325 MG PO TABS
1.0000 | ORAL_TABLET | Freq: Three times a day (TID) | ORAL | Status: DC | PRN
  Administered 2015-10-30: 1 via ORAL
  Filled 2015-10-30: qty 1

## 2015-10-30 MED ORDER — ACETAMINOPHEN 325 MG PO TABS
650.0000 mg | ORAL_TABLET | Freq: Four times a day (QID) | ORAL | Status: DC | PRN
  Administered 2015-10-30 – 2015-10-31 (×3): 650 mg via ORAL
  Filled 2015-10-30 (×3): qty 2

## 2015-10-30 MED ORDER — ALENDRONATE SODIUM 70 MG PO TABS: 70.0000 mg | ORAL_TABLET | ORAL | Status: DC

## 2015-10-30 NOTE — ED Notes (Signed)
Patient is in CT.

## 2015-10-30 NOTE — ED Notes (Signed)
Gave patient of Orange juice without any vomiting, gagging, or nausea.

## 2015-10-30 NOTE — ED Notes (Signed)
Patient states c/o a fall tonight. Upon EMS arrival pt was awake, alert, warm, and dry with a blood sugar of 56. Pt given Glucagon by EMS prior to arrival with a blood sugar recheck of 76. Pt arrived to ED alert and oriented.

## 2015-10-30 NOTE — ED Notes (Signed)
Spoke with hospitalist verbalized on phone states will place orders for pain medication.

## 2015-10-30 NOTE — ED Provider Notes (Signed)
CSN: [id number]     Arrival date & time 10/30/15  0421 History   First MD Initiated Contact with Patient 10/30/15 [phone]     Chief Complaint  Patient presents with  . Fall     (Consider location/radiation/quality/duration/timing/severity/associated sxs/prior Treatment) HPI  This is a 75 year old female with history diabetes who presents with disorientation and a fall. Patient reports that she fell 2 nights ago while getting out of bed. Her husband helped her up and she hit her head. She states that over the last 2 nights she has felt more disoriented. She is able to provide history today. She states that tonight she called for her husband to got her out of bed and noted that she was very weak and lowered her to the floor. She did not fall tonight. Blood sugar upon EMS arrival was 56. Patient does take insulin daily. No recent changes in her insulin regimen. She was given glucagon. Repeat CBG here 56. She denies any infectious symptoms including fever, cough, urinary symptoms.  Past Medical History  Diagnosis Date  . Diabetes mellitus without complication [facility](HCC)    Past Surgical History  Procedure Laterality Date  . Hernia repair    . Tonsillectomy     Family History  Problem Relation Age of Onset  . Heart attack Father    Social History  Substance Use Topics  . Smoking status: Never Smoker   . Smokeless tobacco: None  . Alcohol Use: No   OB History    No data available     Review of Systems  Constitutional: Negative for fever.  Respiratory: Negative for cough and shortness of breath.   Cardiovascular: Negative for chest pain.  Gastrointestinal: Negative for nausea, vomiting and abdominal pain.  Genitourinary: Negative for dysuria.  Neurological: Positive for dizziness and light-headedness.  All other systems reviewed and are negative.     Allergies  Ciprofloxacin  Home Medications   Prior to Admission medications   Medication Sig Start Date End Date Taking?  Authorizing Provider  alendronate (FOSAMAX) 70 MG tablet Take 70 mg by mouth once a week.  03/24/14  Yes Historical Provider, MD  Ascorbic Acid (VITAMIN C) 1000 MG tablet Take 1,000 mg by mouth daily.   Yes Historical Provider, MD  aspirin (GOODSENSE ASPIRIN) 81 MG chewable tablet Chew 81 mg by mouth daily.   Yes Historical Provider, MD  beta carotene w/minerals (OCUVITE) tablet Take 1 tablet by mouth daily.   Yes Historical Provider, MD  Calcium Carbonate-Vitamin D 600-400 MG-UNIT tablet Take 1 tablet by mouth daily.   Yes Historical Provider, MD  Cholecalciferol (VITAMIN D3) 1000 units CAPS Take 1,000 Units by mouth daily.   Yes Historical Provider, MD  ibuprofen (ADVIL,MOTRIN) 800 MG tablet Take 800 mg by mouth every 8 (eight) hours as needed for mild pain.   Yes Historical Provider, MD  insulin NPH Human (HUMULIN N,NOVOLIN N) 100 UNIT/ML injection Inject 12 Units into the skin 2 (two) times daily before a meal.    Yes Historical Provider, MD  lisinopril (PRINIVIL,ZESTRIL) 20 MG tablet Take 20 mg by mouth daily. 10/03/15  Yes Historical Provider, MD  metFORMIN (GLUCOPHAGE) 500 MG tablet Take 500 mg by mouth 2 (two) times daily with a meal.   Yes Historical Provider, MD  omeprazole (PRILOSEC) 40 MG capsule Take 40 mg by mouth 2 (two) times daily.    Yes Historical Provider, MD  oxybutynin (DITROPAN) 5 MG tablet Take 5 mg by mouth 2 (two) times daily. 10/19/15  Yes Historical Provider, MD  trimethoprim (TRIMPEX) 100 MG tablet Take 100 mg by mouth daily.   Yes Historical Provider, MD   BP 112/67 mmHg  Pulse 88  Temp(Src) 98.1 F (36.7 C) (Oral)  Resp 22  Ht  (1.448 m)  Wt 200 lb (90.719 kg)  BMI 43.27 kg/m2  SpO2 100% Physical Exam  Constitutional: No distress.  Overweight  HENT:  Head: Normocephalic.  Contusion noted left forehead  Eyes: Pupils are equal, round, and reactive to light.  Cardiovascular: Normal rate, regular rhythm and normal heart sounds.   No murmur  heard. Pulmonary/Chest: Effort normal and breath sounds normal. No respiratory distress. She has no wheezes.  Abdominal: Soft. Bowel sounds are normal. There is no tenderness. There is no rebound.  Musculoskeletal:  Bruising right knee  Neurological: She is alert.  Oriented to person and place, cranial nerves II through XII intact, 5 out of 5 strength in all 4 extremities.  Skin: Skin is warm and dry.  Psychiatric: She has a normal mood and affect.  Nursing note and vitals reviewed.   ED Course  Procedures (including critical care time) Labs Review Labs Reviewed  CBC WITH DIFFERENTIAL/PLATELET - Abnormal; Notable for the following:    RBC 3.60 (*)    Hemoglobin 11.5 (*)    HCT 33.5 (*)    Neutro Abs 8.2 (*)    All other components within normal limits  URINALYSIS, ROUTINE W REFLEX MICROSCOPIC (NOT AT [facility]) - Abnormal; Notable for the following:    Ketones, ur 15 (*)    Leukocytes, UA SMALL (*)    All other components within normal limits  COMPREHENSIVE METABOLIC PANEL - Abnormal; Notable for the following:    CO2 19 (*)    Glucose, Bld 184 (*)    BUN 22 (*)    Creatinine, Ser 1.31 (*)    GFR calc non Af Amer 39 (*)    GFR calc Af Amer 45 (*)    All other components within normal limits  URINE MICROSCOPIC-ADD ON - Abnormal; Notable for the following:    Squamous Epithelial / LPF 0-5 (*)    Bacteria, UA FEW (*)    All other components within normal limits  CBG MONITORING, ED - Abnormal; Notable for the following:    Glucose-Capillary 56 (*)    All other components within normal limits  CBG MONITORING, ED - Abnormal; Notable for the following:    Glucose-Capillary 171 (*)    All other components within normal limits    Imaging Review Ct Head Wo Contrast  10/30/2015  CLINICAL DATA:  Initial evaluation for acute trauma, fall. EXAM: CT HEAD WITHOUT CONTRAST TECHNIQUE: Contiguous axial images were obtained from the base of the skull through the vertex without intravenous  contrast. COMPARISON:  None. FINDINGS: Small contusion at the left frontal scalp. Scalp soft tissues otherwise unremarkable. No acute abnormality about the globes and orbits. Paranasal sinuses are clear. No mastoid effusion. Middle ear cavities are clear. Calvarium intact.  Hyperostosis frontalis interna noted. Age-related cerebral atrophy present. Mild for age chronic small vessel ischemic disease. No acute intracranial hemorrhage or infarct. No mass lesion, midline shift, or mass effect. No hydrocephalus. No extra-axial fluid collection. IMPRESSION: 1. No acute intracranial process. 2. Small left frontal scalp contusion. Electronically Signed   By: Rise Mu M.D.   On: 10/30/2015 06:15   I have personally reviewed and evaluated these images and lab results as part of my medical decision-making.   EKG Interpretation  Date/Time:  Monday October 30 2015 06:07:18 EDT Ventricular Rate:  82 PR Interval:    QRS Duration: 95 QT Interval:  382 QTC Calculation: 447 R Axis:   3 Text Interpretation:  Sinus rhythm Borderline short PR interval Low  voltage, precordial leads Confirmed by Mylin Gignac  MD, Toni Amend ([id number]) on  10/30/2015 6:32:21 AM Also confirmed by Wilkie Aye  MD, Avo Schlachter ([id number]),  editor [place]  CCT, BEVERLY ([id number])  on 10/30/2015 7:46:45 AM      MDM   Final diagnoses:  Fall, initial encounter  Orthostasis  Hypoglycemia    Patient presents following a fall. Reports generalized weakness and disorientation over last 2-3 days. She is nontoxic on exam. Initial vital signs notable for blood pressure of 103/40. She takes lisinopril  For blood pressure medication. Patient was given fluids. Orthostatics positive with increased heart rate and drop in blood pressure. EKG shows no evidence of arrhythmia. CT head negative.  Evidence of a mild acute kidney injury.  Will admit for hypoglycemia and dehydration.    Shon Baton, MD 10/30/15 [phone]

## 2015-10-30 NOTE — ED Notes (Signed)
Pt requested pain medication before transfer. Agreed to administer however pt was not sure if she had been given medication. Informed pt she would have to remain in ED for observation to ensure no adverse reaction for transferring to her room. Pt and family agreed she could wait until she transferred to her room. She did request to have medication upon arrival if possible. I did inform pt and family about IV and PO medications to help with additional pain control. Pt and family found to be very helpful and appreciated the attention regarding her pain.

## 2015-10-30 NOTE — ED Notes (Signed)
Delay in patient transportation due to hospitalist speaking with patient

## 2015-10-30 NOTE — ED Notes (Signed)
Bed: [id number] Expected date:  Expected time:  Means of arrival:  Comments: EMS female/fell out of bed CBG 53

## 2015-10-30 NOTE — H&P (Addendum)
History and Physical  Victoria Tyler WGN:[id number] DOB: [birth year] DOA: 10/30/2015  Referring physician: EDP PCP: No primary care provider on file.   Chief Complaint: falls, weakness, confusion, hypoglycemia, orthostatic hypotension, right knee pain  HPI: Victoria Tyler is a 75 y.o. female  With h/o htn, insulin dependent diabetes, presented to [facility] ED with about complaints, she reports she was well until a few days ago when she started to feel weak , and dizzy upon standing up, she fell two nights ago while getting out of bed, she hit her head. Last night she needed help getting out of bed because of progressive weakness. EMS called , blood sugar upon EMA arrival was 56. She was given glucagon by EMS and blood sugar recheck was 76. She reported being a diabetic for 71yrs, not recent change of her insulin regimen. She reported her blood pressure medicine was changed a few weeks ago from lisinopril/hctz to lisinopril only due to low sodium. She report good appetite, weight has been stable this year, no fever, no chest pain, no cough, no abdominal pain, no urinary or bowel issues. She does report right knee pain after the fall.  ED course: she is not in acute distress, but she is orthostatic, but less confused, she was given orange juice, blood sugar now 184. Labs wbc19, bun 22/cr 1.3. UA with few bacteria and small leukocytes. CT head no acute findings. ekg no acute findings, Knee xray pending, she is given hydration, hospitalist called to admit the patient due to progressive weakness, hypoglycemia, orthostatic hypotension, dehydration, aki.   Review of Systems:  Detail per HPI, Review of systems are otherwise negative  Past Medical History  Diagnosis Date  . Diabetes mellitus without complication [facility])    Past Surgical History  Procedure Laterality Date  . Hernia repair    . Tonsillectomy     Social History:  reports that she has never smoked. She does not have any smokeless tobacco history on  file. She reports that she does not drink alcohol or use illicit drugs. Patient lives at home with husband & is able to participate in activities of daily living independently   Allergies  Allergen Reactions  . Ciprofloxacin     Hives. Patient reported it was itching at the IV site.    Family History  Problem Relation Age of Onset  . Heart attack Father       Prior to Admission medications   Medication Sig Start Date End Date Taking? Authorizing Provider  alendronate (FOSAMAX) 70 MG tablet Take 70 mg by mouth once a week.  03/24/14  Yes Historical Provider, MD  Ascorbic Acid (VITAMIN C) 1000 MG tablet Take 1,000 mg by mouth daily.   Yes Historical Provider, MD  aspirin (GOODSENSE ASPIRIN) 81 MG chewable tablet Chew 81 mg by mouth daily.   Yes Historical Provider, MD  beta carotene w/minerals (OCUVITE) tablet Take 1 tablet by mouth daily.   Yes Historical Provider, MD  Calcium Carbonate-Vitamin D 600-400 MG-UNIT tablet Take 1 tablet by mouth daily.   Yes Historical Provider, MD  Cholecalciferol (VITAMIN D3) 1000 units CAPS Take 1,000 Units by mouth daily.   Yes Historical Provider, MD  ibuprofen (ADVIL,MOTRIN) 800 MG tablet Take 800 mg by mouth every 8 (eight) hours as needed for mild pain.   Yes Historical Provider, MD  insulin NPH Human (HUMULIN N,NOVOLIN N) 100 UNIT/ML injection Inject 12 Units into the skin 2 (two) times daily before a meal.    Yes Historical Provider, MD  lisinopril (PRINIVIL,ZESTRIL) 20 MG tablet Take 20 mg by mouth daily. 10/03/15  Yes Historical Provider, MD  metFORMIN (GLUCOPHAGE) 500 MG tablet Take 500 mg by mouth 2 (two) times daily with a meal.   Yes Historical Provider, MD  omeprazole (PRILOSEC) 40 MG capsule Take 40 mg by mouth 2 (two) times daily.    Yes Historical Provider, MD  oxybutynin (DITROPAN) 5 MG tablet Take 5 mg by mouth 2 (two) times daily. 10/19/15  Yes Historical Provider, MD  trimethoprim (TRIMPEX) 100 MG tablet Take 100 mg by mouth daily.   Yes  Historical Provider, MD    Physical Exam: BP 112/67 mmHg  Pulse 88  Temp(Src) 98.1 F (36.7 C) (Oral)  Resp 22  Ht 4\' 9"  (1.448 m)  Wt 90.719 kg (200 lb)  BMI 43.27 kg/m2  SpO2 100%  General:  Aaox3, pleasant Eyes: PERRL ENT: unremarkable Neck: supple, no JVD Cardiovascular: RRR Respiratory: CTABL Abdomen: soft/ND/ND, positive bowel sounds Skin: no rash Musculoskeletal:  Right knee tender to palpation, limited range of motion due to generalized weakness and pain Psychiatric: calm/cooperative Neurologic: no focal findings            Labs on Admission:  Basic Metabolic Panel:  Recent Labs Lab 10/30/15 0607  NA 135  K 4.6  CL 107  CO2 19*  GLUCOSE 184*  BUN 22*  CREATININE 1.31*  CALCIUM 8.9   Liver Function Tests:  Recent Labs Lab 10/30/15 0607  AST 28  ALT 16  ALKPHOS 46  BILITOT 1.0  PROT 6.7  ALBUMIN 3.7   No results for input(s): LIPASE, AMYLASE in the last 168 hours. No results for input(s): AMMONIA in the last 168 hours. CBC:  Recent Labs Lab 10/30/15 0607  WBC 10.2  NEUTROABS 8.2*  HGB 11.5*  HCT 33.5*  MCV 93.1  PLT 287   Cardiac Enzymes: No results for input(s): CKTOTAL, CKMB, CKMBINDEX, TROPONINI in the last 168 hours.  BNP (last 3 results) No results for input(s): BNP in the last 8760 hours.  ProBNP (last 3 results) No results for input(s): PROBNP in the last 8760 hours.  CBG:  Recent Labs Lab 10/30/15 0431 10/30/15 0614  GLUCAP 56* 171*    Radiological Exams on Admission: Ct Head Wo Contrast  10/30/2015  CLINICAL DATA:  Initial evaluation for acute trauma, fall. EXAM: CT HEAD WITHOUT CONTRAST TECHNIQUE: Contiguous axial images were obtained from the base of the skull through the vertex without intravenous contrast. COMPARISON:  None. FINDINGS: Small contusion at the left frontal scalp. Scalp soft tissues otherwise unremarkable. No acute abnormality about the globes and orbits. Paranasal sinuses are clear. No mastoid  effusion. Middle ear cavities are clear. Calvarium intact.  Hyperostosis frontalis interna noted. Age-related cerebral atrophy present. Mild for age chronic small vessel ischemic disease. No acute intracranial hemorrhage or infarct. No mass lesion, midline shift, or mass effect. No hydrocephalus. No extra-axial fluid collection. IMPRESSION: 1. No acute intracranial process. 2. Small left frontal scalp contusion. Electronically Signed   By: Rise Mu M.D.   On: 10/30/2015 06:15    EKG: Independently reviewed. Sinus rhythm, no acute st/t change, QTc wnl  Assessment/Plan Present on Admission:  **None**   orthostatic hypotension/aki: likely from dehydration, no infection identified. Continue ivf, hold home bp meds. Check am cortisol level. Repeat orthostatic in am.  Insulin dependent diabetes, presented with hypoglycemia, s/p glucagon by EMS, received orange juice in the ED. Confusion has resolved. Ct head no acute findings, Hold home insulin, hold metformin,  start ssi, a1c pending,   Right knee pain: x ray with lateral patella subluxation. No dislocation, no fracture. She reported was seen by [facility] for right knee pain was given hydrocodone and knee brace, she report her knee pain is worse. Pain control, ortho consulted.  [facility] [facility] Dr swinteck reviewed the knee x ray states there is no patella subluxation, he recommended CT knee, if no fracture, patient can have knee immobilizer, WBAT, start PT and outpatient follow up with ortho, if ct knee with fracture call ortho back.  Fall: possibly from orthostatic hypotension. Fall precaution, PT eval  Morbid obesity: Body mass index is 43.27 kg/(m^2).    DVT prophylaxis: lovenox  Consultants: [facility]  Code Status: full   Family Communication:  Patient and husband  Disposition Plan: admit to med tele obs,   Time spent: 75mins  Keyonta Barradas MD, PhD [facility] Pager [phone] If 7PM-7AM,  please contact night-coverage at [email or web address], password [email or web address]

## 2015-10-30 NOTE — Progress Notes (Signed)

## 2015-10-31 DIAGNOSIS — W19XXXA Unspecified fall, initial encounter: Secondary | ICD-10-CM | POA: Diagnosis not present

## 2015-10-31 DIAGNOSIS — E162 Hypoglycemia, unspecified: Secondary | ICD-10-CM | POA: Diagnosis not present

## 2015-10-31 DIAGNOSIS — N179 Acute kidney failure, unspecified: Secondary | ICD-10-CM | POA: Diagnosis not present

## 2015-10-31 DIAGNOSIS — E119 Type 2 diabetes mellitus without complications: Secondary | ICD-10-CM | POA: Diagnosis not present

## 2015-10-31 LAB — GLUCOSE, CAPILLARY: GLUCOSE-CAPILLARY: 94 mg/dL (ref 65–99)

## 2015-10-31 LAB — BASIC METABOLIC PANEL
ANION GAP: 6 (ref 5–15)
BUN: 16 mg/dL (ref 6–20)
CO2: 19 mmol/L — AB (ref 22–32)
Calcium: 8.2 mg/dL — ABNORMAL LOW (ref 8.9–10.3)
Chloride: 108 mmol/L (ref 101–111)
Creatinine, Ser: 0.98 mg/dL (ref 0.44–1.00)
GFR, EST NON AFRICAN AMERICAN: 55 mL/min — AB (ref >60)
GLUCOSE: 109 mg/dL — AB (ref 65–99)
POTASSIUM: 4.4 mmol/L (ref 3.5–5.1)
Sodium: 133 mmol/L — ABNORMAL LOW (ref 135–145)

## 2015-10-31 LAB — CBC
HEMATOCRIT: 31.6 % — AB (ref 36.0–46.0)
Hemoglobin: 10.9 g/dL — ABNORMAL LOW (ref 12.0–15.0)
MCH: 32.3 pg (ref 26.0–34.0)
MCHC: 34.5 g/dL (ref 30.0–36.0)
MCV: 93.8 fL (ref 78.0–100.0)
Platelets: 279 10*3/uL (ref 150–400)
RBC: 3.37 MIL/uL — AB (ref 3.87–5.11)
RDW: 14.6 % (ref 11.5–15.5)
WBC: 6.4 10*3/uL (ref 4.0–10.5)

## 2015-10-31 LAB — CORTISOL: CORTISOL PLASMA: 14.9 ug/dL

## 2015-10-31 LAB — TSH: TSH: 3.462 u[IU]/mL (ref 0.350–4.500)

## 2015-10-31 MED ORDER — SODIUM CHLORIDE 0.9 % IV BOLUS (SEPSIS)
1000.0000 mL | Freq: Once | INTRAVENOUS | Status: AC
  Administered 2015-10-31: 1000 mL via INTRAVENOUS

## 2015-10-31 NOTE — Discharge Instructions (Signed)
Follow with Primary MD Verlon AuBoyd, Tammy Lamonica, MD in 7 days   Get CBC, CMP, 2 view Chest X ray checked  by Primary MD or SNF MD in 5-7 days ( we routinely change or add medications that can affect your baseline labs and fluid status, therefore we recommend that you get the mentioned basic workup next visit with your PCP, your PCP may decide not to get them or add new tests based on their clinical decision)   Activity: As tolerated with Full fall precautions use walker/cane & assistance as needed   Disposition Home     Diet:   Heart Healthy Low Carb.  Accuchecks 4 times/day, Once in AM empty stomach and then before each meal. Log in all results and show them to your Prim.MD in 3 days. If any glucose reading is under 80 or above 300 call your Prim MD immidiately. Follow Low glucose instructions for glucose under 80 as instructed.   For Heart failure patients - Check your Weight same time everyday, if you gain over 2 pounds, or you develop in leg swelling, experience more shortness of breath or chest pain, call your Primary MD immediately. Follow Cardiac Low Salt Diet and 1.5 lit/day fluid restriction.   On your next visit with your primary care physician please Get Medicines reviewed and adjusted.   Please request your Prim.MD to go over all Hospital Tests and Procedure/Radiological results at the follow up, please get all Hospital records sent to your Prim MD by signing hospital release before you go home.   If you experience worsening of your admission symptoms, develop shortness of breath, life threatening emergency, suicidal or homicidal thoughts you must seek medical attention immediately by calling 911 or calling your MD immediately  if symptoms less severe.  You Must read complete instructions/literature along with all the possible adverse reactions/side effects for all the Medicines you take and that have been prescribed to you. Take any new Medicines after you have completely  understood and accpet all the possible adverse reactions/side effects.   Do not drive, operate heavy machinery, perform activities at heights, swimming or participation in water activities or provide baby sitting services if your were admitted for syncope or siezures until you have seen by Primary MD or a Neurologist and advised to do so again.  Do not drive when taking Pain medications.    Do not take more than prescribed Pain, Sleep and Anxiety Medications  Special Instructions: If you have smoked or chewed Tobacco  in the last 2 yrs please stop smoking, stop any regular Alcohol  and or any Recreational drug use.  Wear Seat belts while driving.   Please note  You were cared for by a hospitalist during your hospital stay. If you have any questions about your discharge medications or the care you received while you were in the hospital after you are discharged, you can call the unit and asked to speak with the hospitalist on call if the hospitalist that took care of you is not available. Once you are discharged, your primary care physician will handle any further medical issues. Please note that NO REFILLS for any discharge medications will be authorized once you are discharged, as it is imperative that you return to your primary care physician (or establish a relationship with a primary care physician if you do not have one) for your aftercare needs so that they can reassess your need for medications and monitor your lab values.

## 2015-10-31 NOTE — Care Management Obs Status (Signed)
MEDICARE OBSERVATION STATUS NOTIFICATION   Patient Details  Name: Victoria GardenerSandra Ratterman MRN: [id number] Date of Birth: 05/29/1940   Medicare Observation Status Notification Given:  Yes    Alexis Goodelleele, Hailea Eaglin K, RN 10/31/2015, 10:30 AM

## 2015-10-31 NOTE — Discharge Summary (Signed)
Victoria Tyler ZOX:[id number] DOB: Sep 08, 1940 DOA: 10/30/2015  PCP: Verlon Au, MD  Admit date: 10/30/2015  Discharge date: 10/31/2015  Admitted From: Home   Disposition:  Home   Recommendations for Outpatient Follow-up:   Follow up with PCP in 1-2 weeks  PCP Please obtain BMP/CBC, 2 view CXR in 1week,  (see Discharge instructions)   PCP Please follow up on the following pending results: None   Home Health: None   Equipment/Devices: Bathtub bench  Consultations: Orthopedics over the phone Dr. Lisette Grinder Discharge Condition: Stable   CODE STATUS: Full   Diet Recommendation: Heart healthy, low carbohydrate   Chief Complaint  Patient presents with  . Fall     Brief history of present illness from the day of admission and additional interim summary     Victoria Tyler is a 75 y.o. female  With h/o htn, insulin dependent diabetes, presented to [facility] ED with about complaints, she reports she was well until a few days ago when she started to feel weak , and dizzy upon standing up, she fell two nights ago while getting out of bed, she hit her head. Last night she needed help getting out of bed because of progressive weakness. EMS called , blood sugar upon EMA arrival was 56. She was given glucagon by EMS and blood sugar recheck was 76. She reported being a diabetic for 61yrs, not recent change of her insulin regimen. She reported her blood pressure medicine was changed a few weeks ago from lisinopril/hctz to lisinopril only due to low sodium. She report good appetite, weight has been stable this year, no fever, no chest pain, no cough, no abdominal pain, no urinary or bowel issues. She does report right knee pain after the fall.  ED course: she is not in acute distress, but she is orthostatic, but less confused, she was  given orange juice, blood sugar now 184. Labs wbc19, bun 22/cr 1.3. UA with few bacteria and small leukocytes. CT head no acute findings. ekg no acute findings, Knee xray pending, she is given hydration, hospitalist called to admit the patient due to progressive weakness, hypoglycemia, orthostatic hypotension, dehydration, aki.  Hospital issues addressed      1.Dehydration, hypoglycemia from skipped meals, orthostatic hypotension and fall with soft tissue right knee injury. Stable after hydration, patient counseled not to skip meals while taking insulin and to check her CBGs every before meals at bedtime and maintenance log book, she was not orthostatic this morning after hydration, ambulating in the hallway without any discomfort, seen by PT cleared for home discharge.  2. Right knee soft tissue injury. CT scan without any fracture, case was discussed in the ER with Dr. swing tech who recommended knee immobilizer along with supportive care and outpatient or to follow-up. Patient bearing weight and ambulating, feels at baseline and eager to go home.  3. Insulin independent type 2 diabetes mellitus. Asian says she skipped a few meals but continue to take her insulin causing hypoglycemia, has been counseled as in #1 above.  Discharge diagnosis     Active Problems:   Hypoglycemia   Fall    Discharge instructions    Discharge Instructions    Discharge instructions    Complete by:  As directed   Follow with Primary MD Verlon Au, MD in 7 days   Get CBC, CMP, 2 view Chest X ray checked  by Primary MD or SNF MD in 5-7 days ( we routinely change or add medications that can affect your baseline labs and fluid status, therefore we recommend that you get the mentioned basic workup next visit with your PCP, your PCP may decide not to get them or add new tests based on their clinical decision)   Activity: As tolerated with Full fall precautions use walker/cane & assistance as  needed   Disposition Home     Diet:   Heart Healthy Low Carb.  Accuchecks 4 times/day, Once in AM empty stomach and then before each meal. Log in all results and show them to your Prim.MD in 3 days. If any glucose reading is under 80 or above 300 call your Prim MD immidiately. Follow Low glucose instructions for glucose under 80 as instructed.   For Heart failure patients - Check your Weight same time everyday, if you gain over 2 pounds, or you develop in leg swelling, experience more shortness of breath or chest pain, call your Primary MD immediately. Follow Cardiac Low Salt Diet and 1.5 lit/day fluid restriction.   On your next visit with your primary care physician please Get Medicines reviewed and adjusted.   Please request your Prim.MD to go over all Hospital Tests and Procedure/Radiological results at the follow up, please get all Hospital records sent to your Prim MD by signing hospital release before you go home.   If you experience worsening of your admission symptoms, develop shortness of breath, life threatening emergency, suicidal or homicidal thoughts you must seek medical attention immediately by calling 911 or calling your MD immediately  if symptoms less severe.  You Must read complete instructions/literature along with all the possible adverse reactions/side effects for all the Medicines you take and that have been prescribed to you. Take any new Medicines after you have completely understood and accpet all the possible adverse reactions/side effects.   Do not drive, operate heavy machinery, perform activities at heights, swimming or participation in water activities or provide baby sitting services if your were admitted for syncope or siezures until you have seen by Primary MD or a Neurologist and advised to do so again.  Do not drive when taking Pain medications.    Do not take more than prescribed Pain, Sleep and Anxiety Medications  Special Instructions: If you  have smoked or chewed Tobacco  in the last 2 yrs please stop smoking, stop any regular Alcohol  and or any Recreational drug use.  Wear Seat belts while driving.   Please note  You were cared for by a hospitalist during your hospital stay. If you have any questions about your discharge medications or the care you received while you were in the hospital after you are discharged, you can call the unit and asked to speak with the hospitalist on call if the hospitalist that took care of you is not available. Once you are discharged, your primary care physician will handle any further medical issues. Please note that NO REFILLS for any discharge medications will be authorized once you are discharged, as it is imperative that you return to your primary care  physician (or establish a relationship with a primary care physician if you do not have one) for your aftercare needs so that they can reassess your need for medications and monitor your lab values.     Increase activity slowly    Complete by:  As directed            Discharge Medications     Medication List    TAKE these medications        alendronate 70 MG tablet  Commonly known as:  FOSAMAX  Take 70 mg by mouth once a week.     beta carotene w/minerals tablet  Take 1 tablet by mouth daily.     Calcium Carbonate-Vitamin D 600-400 MG-UNIT tablet  Take 1 tablet by mouth daily.     GOODSENSE ASPIRIN 81 MG chewable tablet  Generic drug:  aspirin  Chew 81 mg by mouth daily.     ibuprofen 800 MG tablet  Commonly known as:  ADVIL,MOTRIN  Take 800 mg by mouth every 8 (eight) hours as needed for mild pain.     insulin NPH Human 100 UNIT/ML injection  Commonly known as:  HUMULIN N,NOVOLIN N  Inject 12 Units into the skin 2 (two) times daily before a meal.     lisinopril 20 MG tablet  Commonly known as:  PRINIVIL,ZESTRIL  Take 20 mg by mouth daily.     metFORMIN 500 MG tablet  Commonly known as:  GLUCOPHAGE  Take 500 mg by mouth  2 (two) times daily with a meal.     omeprazole 40 MG capsule  Commonly known as:  PRILOSEC  Take 40 mg by mouth 2 (two) times daily.     oxybutynin 5 MG tablet  Commonly known as:  DITROPAN  Take 5 mg by mouth 2 (two) times daily.     trimethoprim 100 MG tablet  Commonly known as:  TRIMPEX  Take 100 mg by mouth daily.     vitamin C 1000 MG tablet  Take 1,000 mg by mouth daily.     Vitamin D3 1000 units Caps  Take 1,000 Units by mouth daily.        Allergies  Allergen Reactions  . Ciprofloxacin     Hives. Patient reported it was itching at the IV site.        Follow-up Information    Follow up with Verlon AuBoyd, Tammy Lamonica, MD. Schedule an appointment as soon as possible for a visit in 1 week.   Specialty:  Family Medicine   Contact information:   [address] [place] [address] [phone]       Follow up with Swinteck, Cloyde ReamsBrian James, MD. Schedule an appointment as soon as possible for a visit in 3 days.   Specialty:  Orthopedic Surgery   Contact information:   [address] [place] [address] [phone]       Major procedures and Radiology Reports - PLEASE review detailed and final reports thoroughly  -         Ct Head Wo Contrast  10/30/2015  CLINICAL DATA:  Initial evaluation for acute trauma, fall. EXAM: CT HEAD WITHOUT CONTRAST TECHNIQUE: Contiguous axial images were obtained from the base of the skull through the vertex without intravenous contrast. COMPARISON:  None. FINDINGS: Small contusion at the left frontal scalp. Scalp soft tissues otherwise unremarkable. No acute abnormality about the globes and orbits. Paranasal sinuses are clear. No mastoid effusion. Middle ear cavities are clear. Calvarium intact.  Hyperostosis frontalis interna noted. Age-related cerebral atrophy present. Mild for age chronic small vessel ischemic disease. No acute intracranial hemorrhage or infarct. No mass lesion, midline shift, or mass  effect. No hydrocephalus. No extra-axial fluid collection. IMPRESSION: 1. No acute intracranial process. 2. Small left frontal scalp contusion. Electronically Signed   By: Rise Mu M.D.   On: 10/30/2015 06:15   Ct Knee Right Wo Contrast  10/30/2015  CLINICAL DATA:  Fall 2 days ago.  Lateral patellar subluxation. EXAM: CT OF THE right KNEE WITHOUT CONTRAST TECHNIQUE: Multidetector CT imaging of the right knee was performed according to the standard protocol. Multiplanar CT image reconstructions were also generated. COMPARISON:  10/30/2015 FINDINGS: The patella is seated in the femoral trochlear groove and is not dislocated or subluxed currently. There is an osteochondroma of the distal femoral metaphysis medially without a thick cartilaginous cap. Small knee joint effusion. Focal subcutaneous edema anterior to the patella, patellar tendon, and anterior to the patellar retinacula. Medial subcutaneous venous varicosities. There is a geode along the lateral patellar facet. Moderate to prominent thinning of articular cartilage laterally in the patellofemoral joint. No obvious discontinuity of the quadriceps or patellar tendon. No notable Baker's cyst. IMPRESSION: 1. Subcutaneous edema anterior to the patellar tendon and patellar retinacula. 2. No patellar subluxation observed. However, there are degenerative findings laterally in the patellofemoral joint with considerable loss of articular space. 3. Small knee joint effusion. 4. Distal osteochondroma of the medial femoral metaphysis without a thick cartilaginous cap. Benign finding. Electronically Signed   By: Gaylyn Rong M.D.   On: 10/30/2015 14:45   Dg Knee Complete 4 Views Right  10/30/2015  CLINICAL DATA:  Pain following fall EXAM: RIGHT KNEE - COMPLETE 4+ VIEW COMPARISON:  July 09, 2015 FINDINGS: Frontal, lateral, and bilateral oblique views were obtained. There is no fracture or dislocation. There is lateral patellar subluxation. There is  no appreciable joint effusion. There is a benign exostosis arising from the medial posterior aspect of the distal femoral metaphysis measuring 2.0 x 1.2 cm, stable. No erosive change. There is mild joint space narrowing in the patellofemoral joint. Other joint spaces appear normal. IMPRESSION: Lateral patellar subluxation. No frank dislocation. No fracture or joint effusion. Stable benign distal femoral exostosis. Mild narrowing patellofemoral joint. Electronically Signed   By: Bretta Bang III M.D.   On: 10/30/2015 08:16    Micro Results   No results found for this or any previous visit (from the past 240 hour(s)).  Today   Subjective    Victoria Tyler today has no headache,no chest abdominal pain,no new weakness tingling or numbness, feels much better wants to go home today.    Objective   Blood pressure 132/105, pulse 85, temperature 98.2 F (36.8 C), temperature source Oral, resp. rate 20, height 4\' 9"  (1.448 m), weight 90.719 kg (200 lb), SpO2 100 %.   Intake/Output Summary (Last 24 hours) at 10/31/15 1108 Last data filed at 10/31/15 0820  Gross per 24 hour  Intake    240 ml  Output      0 ml  Net    240 ml    Exam Awake Alert, Oriented x 3, No new F.N deficits, Normal affect [place].AT,PERRAL Supple Neck,No JVD, No cervical lymphadenopathy appriciated.  Symmetrical Chest wall movement, Good air movement bilaterally, CTAB RRR,No Gallops,Rubs or new Murmurs, No Parasternal Heave +ve B.Sounds, Abd Soft, Non tender, No organomegaly appriciated, No rebound -guarding or rigidity. No Cyanosis, Clubbing or edema, No new Rash or bruise  Data Review   CBC w Diff: Lab Results  Component Value Date   WBC 6.4 10/31/2015   HGB 10.9* 10/31/2015   HCT 31.6* 10/31/2015   PLT 279 10/31/2015   LYMPHOPCT 12 10/30/2015   MONOPCT 7 10/30/2015   EOSPCT 1 10/30/2015   BASOPCT 0 10/30/2015    CMP: Lab Results  Component Value Date   NA 133* 10/31/2015   K 4.4 10/31/2015   CL 108  10/31/2015   CO2 19* 10/31/2015   BUN 16 10/31/2015   CREATININE 0.98 10/31/2015   PROT 6.7 10/30/2015   ALBUMIN 3.7 10/30/2015   BILITOT 1.0 10/30/2015   ALKPHOS 46 10/30/2015   AST 28 10/30/2015   ALT 16 10/30/2015  .   Total Time in preparing paper work, data evaluation and todays exam - 35 minutes  Leroy Sea M.D on 10/31/2015 at 11:08 AM  [facility]   Office  [phone]

## 2015-10-31 NOTE — Progress Notes (Signed)
Spoke with patient at bedside, for d/c home today. Requesting a shower chair, contacted [facility] to deliver to room.

## 2015-10-31 NOTE — Evaluation (Signed)
Physical Therapy Evaluation & discharge Patient Details Name: Victoria GardenerSandra Tyler MRN: [id number] DOB: 01/20/1941 Today's Date: 10/31/2015   History of Present Illness  75 y/o female admitted after having fall at home with confusion, weakness and hypoglycemia.  X-ray and CT negative for fx of R knee  Clinical Impression  Pt doing well with ambulation and educated on use of knee immobilizer and use of RW.  Supportive husband an motivated patient.  No follow up PT needed.  Will sign off.    Follow Up Recommendations No PT follow up    Equipment Recommendations  Other (comment) (tub transfer bench)    Recommendations for Other Services       Precautions / Restrictions Precautions Precautions: Fall Required Braces or Orthoses: Knee Immobilizer - Right Knee Immobilizer - Right: Other (comment) (for 1 week) Restrictions Weight Bearing Restrictions: Yes RLE Weight Bearing: Weight bearing as tolerated      Mobility  Bed Mobility               General bed mobility comments: in recliner  Transfers Overall transfer level: Modified independent                  Ambulation/Gait Ambulation/Gait assistance: Modified independent (Device/Increase time);Supervision Ambulation Distance (Feet): 325 Feet Assistive device: Rolling walker (2 wheeled);None Gait Pattern/deviations: Antalgic;Wide base of support     General Gait Details: Amb with MOD I with RW with slight alteration in gait pattern due to KI. Amb 650' without RW with S  Stairs            Wheelchair Mobility    Modified Rankin (Stroke Patients Only)       Balance Overall balance assessment: History of Falls                                           Pertinent Vitals/Pain Pain Assessment: No/denies pain    Home Living Family/patient expects to be discharged to:: Private residence Living Arrangements: Spouse/significant other Available Help at Discharge: Family;Available 24  hours/day Type of Home: House Home Access: Other (comment) (1 curb)     Home Layout: One level Home Equipment: Hand held shower head;Walker - 2 wheels      Prior Function Level of Independence: Independent               Hand Dominance   Dominant Hand: Right    Extremity/Trunk Assessment   Upper Extremity Assessment: Overall WFL for tasks assessed           Lower Extremity Assessment: Overall WFL for tasks assessed;RLE deficits/detail RLE Deficits / Details: soreness       Communication   Communication: No difficulties  Cognition Arousal/Alertness: Awake/alert Behavior During Therapy: WFL for tasks assessed/performed Overall Cognitive Status: Within Functional Limits for tasks assessed                      General Comments General comments (skin integrity, edema, etc.): Bruising R knee    Exercises        Assessment/Plan    PT Assessment Patent does not need any further PT services  PT Diagnosis Difficulty walking   PT Problem List    PT Treatment Interventions     PT Goals (Current goals can be found in the Care Plan section) Acute Rehab PT Goals Patient Stated Goal: go home today PT Goal Formulation: All  assessment and education complete, DC therapy    Frequency     Barriers to discharge        Co-evaluation               End of Session Equipment Utilized During Treatment: Gait belt;Right knee immobilizer Activity Tolerance: Patient tolerated treatment well Patient left: in chair;with nursing/sitter in room;with call bell/phone within reach;with family/visitor present Nurse Communication: Mobility status    Functional Assessment Tool Used: clinical judgement and objective findings Functional Limitation: Mobility: Walking and moving around Mobility: Walking and Moving Around Current Status [phone]): At least 1 percent but less than 20 percent impaired, limited or restricted Mobility: Walking and Moving Around Goal Status  [phone]): At least 1 percent but less than 20 percent impaired, limited or restricted Mobility: Walking and Moving Around Discharge Status [phone]): At least 1 percent but less than 20 percent impaired, limited or restricted    Time: 0858-0926 PT Time Calculation (min) (ACUTE ONLY): 28 min   Charges:   PT Evaluation $PT Eval Low Complexity: 1 Procedure PT Treatments $Gait Training: 8-22 mins   PT G Codes:   PT G-Codes **NOT FOR INPATIENT CLASS** Functional Assessment Tool Used: clinical judgement and objective findings Functional Limitation: Mobility: Walking and moving around Mobility: Walking and Moving Around Current Status ([id number]): At least 1 percent but less than 20 percent impaired, limited or restricted Mobility: Walking and Moving Around Goal Status [phone]): At least 1 percent but less than 20 percent impaired, limited or restricted Mobility: Walking and Moving Around Discharge Status [phone]): At least 1 percent but less than 20 percent impaired, limited or restricted    [facility] LUBECK 10/31/2015, 9:38 AM

## 2015-11-01 LAB — HEMOGLOBIN A1C
Hgb A1c MFr Bld: 5.2 % (ref 4.8–5.6)
Mean Plasma Glucose: 103 mg/dL

## 2015-11-03 DIAGNOSIS — M25561 Pain in right knee: Secondary | ICD-10-CM | POA: Diagnosis not present

## 2015-11-07 DIAGNOSIS — M25561 Pain in right knee: Secondary | ICD-10-CM | POA: Diagnosis not present

## 2015-11-07 DIAGNOSIS — E11649 Type 2 diabetes mellitus with hypoglycemia without coma: Secondary | ICD-10-CM | POA: Diagnosis not present

## 2015-11-07 DIAGNOSIS — Z09 Encounter for follow-up examination after completed treatment for conditions other than malignant neoplasm: Secondary | ICD-10-CM | POA: Diagnosis not present

## 2015-11-21 DIAGNOSIS — E871 Hypo-osmolality and hyponatremia: Secondary | ICD-10-CM | POA: Diagnosis not present

## 2015-12-26 DIAGNOSIS — Z23 Encounter for immunization: Secondary | ICD-10-CM | POA: Diagnosis not present

## 2015-12-26 DIAGNOSIS — I10 Essential (primary) hypertension: Secondary | ICD-10-CM | POA: Diagnosis not present

## 2015-12-26 DIAGNOSIS — E785 Hyperlipidemia, unspecified: Secondary | ICD-10-CM | POA: Diagnosis not present

## 2015-12-26 DIAGNOSIS — E11649 Type 2 diabetes mellitus with hypoglycemia without coma: Secondary | ICD-10-CM | POA: Diagnosis not present

## 2016-02-06 DIAGNOSIS — Z961 Presence of intraocular lens: Secondary | ICD-10-CM | POA: Diagnosis not present

## 2016-02-06 DIAGNOSIS — E113292 Type 2 diabetes mellitus with mild nonproliferative diabetic retinopathy without macular edema, left eye: Secondary | ICD-10-CM | POA: Diagnosis not present

## 2016-02-06 DIAGNOSIS — H01003 Unspecified blepharitis right eye, unspecified eyelid: Secondary | ICD-10-CM | POA: Diagnosis not present

## 2016-03-26 DIAGNOSIS — I10 Essential (primary) hypertension: Secondary | ICD-10-CM | POA: Diagnosis not present

## 2016-03-26 DIAGNOSIS — E11649 Type 2 diabetes mellitus with hypoglycemia without coma: Secondary | ICD-10-CM | POA: Diagnosis not present

## 2016-03-26 DIAGNOSIS — E785 Hyperlipidemia, unspecified: Secondary | ICD-10-CM | POA: Diagnosis not present

## 2016-04-10 DIAGNOSIS — Z1231 Encounter for screening mammogram for malignant neoplasm of breast: Secondary | ICD-10-CM | POA: Diagnosis not present

## 2016-06-11 DIAGNOSIS — I10 Essential (primary) hypertension: Secondary | ICD-10-CM | POA: Diagnosis not present

## 2016-06-11 DIAGNOSIS — Z79899 Other long term (current) drug therapy: Secondary | ICD-10-CM | POA: Diagnosis not present

## 2016-06-27 DIAGNOSIS — E785 Hyperlipidemia, unspecified: Secondary | ICD-10-CM | POA: Diagnosis not present

## 2016-06-27 DIAGNOSIS — I10 Essential (primary) hypertension: Secondary | ICD-10-CM | POA: Diagnosis not present

## 2016-06-27 DIAGNOSIS — E11649 Type 2 diabetes mellitus with hypoglycemia without coma: Secondary | ICD-10-CM | POA: Diagnosis not present

## 2016-07-02 DIAGNOSIS — I10 Essential (primary) hypertension: Secondary | ICD-10-CM | POA: Diagnosis not present

## 2016-07-02 DIAGNOSIS — E11649 Type 2 diabetes mellitus with hypoglycemia without coma: Secondary | ICD-10-CM | POA: Diagnosis not present

## 2016-07-10 DIAGNOSIS — R300 Dysuria: Secondary | ICD-10-CM | POA: Diagnosis not present

## 2016-07-10 DIAGNOSIS — R399 Unspecified symptoms and signs involving the genitourinary system: Secondary | ICD-10-CM | POA: Diagnosis not present

## 2016-07-11 DIAGNOSIS — R300 Dysuria: Secondary | ICD-10-CM | POA: Diagnosis not present

## 2016-11-05 DIAGNOSIS — R630 Anorexia: Secondary | ICD-10-CM | POA: Diagnosis not present

## 2016-11-05 DIAGNOSIS — R634 Abnormal weight loss: Secondary | ICD-10-CM | POA: Diagnosis not present

## 2016-11-05 DIAGNOSIS — E11649 Type 2 diabetes mellitus with hypoglycemia without coma: Secondary | ICD-10-CM | POA: Diagnosis not present

## 2016-12-23 DIAGNOSIS — D519 Vitamin B12 deficiency anemia, unspecified: Secondary | ICD-10-CM | POA: Diagnosis not present

## 2016-12-23 DIAGNOSIS — R946 Abnormal results of thyroid function studies: Secondary | ICD-10-CM | POA: Diagnosis not present

## 2017-03-11 DIAGNOSIS — E113293 Type 2 diabetes mellitus with mild nonproliferative diabetic retinopathy without macular edema, bilateral: Secondary | ICD-10-CM | POA: Diagnosis not present

## 2017-03-11 DIAGNOSIS — E119 Type 2 diabetes mellitus without complications: Secondary | ICD-10-CM | POA: Diagnosis not present

## 2017-03-11 DIAGNOSIS — Z961 Presence of intraocular lens: Secondary | ICD-10-CM | POA: Diagnosis not present

## 2017-03-11 DIAGNOSIS — H01003 Unspecified blepharitis right eye, unspecified eyelid: Secondary | ICD-10-CM | POA: Diagnosis not present

## 2017-04-11 DIAGNOSIS — Z1231 Encounter for screening mammogram for malignant neoplasm of breast: Secondary | ICD-10-CM | POA: Diagnosis not present

## 2017-04-21 DIAGNOSIS — R634 Abnormal weight loss: Secondary | ICD-10-CM | POA: Diagnosis not present

## 2017-04-21 DIAGNOSIS — Z7984 Long term (current) use of oral hypoglycemic drugs: Secondary | ICD-10-CM | POA: Diagnosis not present

## 2017-04-21 DIAGNOSIS — E11649 Type 2 diabetes mellitus with hypoglycemia without coma: Secondary | ICD-10-CM | POA: Diagnosis not present

## 2017-04-21 DIAGNOSIS — Z1211 Encounter for screening for malignant neoplasm of colon: Secondary | ICD-10-CM | POA: Diagnosis not present

## 2017-04-21 DIAGNOSIS — R630 Anorexia: Secondary | ICD-10-CM | POA: Diagnosis not present

## 2017-04-21 DIAGNOSIS — I10 Essential (primary) hypertension: Secondary | ICD-10-CM | POA: Diagnosis not present

## 2017-04-30 DIAGNOSIS — E11649 Type 2 diabetes mellitus with hypoglycemia without coma: Secondary | ICD-10-CM | POA: Diagnosis not present

## 2017-05-01 DIAGNOSIS — N816 Rectocele: Secondary | ICD-10-CM | POA: Diagnosis not present

## 2017-05-01 DIAGNOSIS — K449 Diaphragmatic hernia without obstruction or gangrene: Secondary | ICD-10-CM | POA: Diagnosis not present

## 2017-05-01 DIAGNOSIS — N8110 Cystocele, unspecified: Secondary | ICD-10-CM | POA: Diagnosis not present

## 2017-05-01 DIAGNOSIS — R630 Anorexia: Secondary | ICD-10-CM | POA: Diagnosis not present

## 2017-08-18 DIAGNOSIS — I10 Essential (primary) hypertension: Secondary | ICD-10-CM | POA: Diagnosis not present

## 2017-08-18 DIAGNOSIS — R634 Abnormal weight loss: Secondary | ICD-10-CM | POA: Diagnosis not present

## 2017-08-18 DIAGNOSIS — E11649 Type 2 diabetes mellitus with hypoglycemia without coma: Secondary | ICD-10-CM | POA: Diagnosis not present

## 2017-09-26 ENCOUNTER — Emergency Department (HOSPITAL_COMMUNITY): Payer: PPO

## 2017-09-26 ENCOUNTER — Emergency Department (HOSPITAL_COMMUNITY)
Admission: EM | Admit: 2017-09-26 | Discharge: 2017-09-26 | Disposition: A | Discharge status: Home / Self Care | Payer: PPO | Attending: Emergency Medicine | Admitting: Emergency Medicine

## 2017-09-26 ENCOUNTER — Other Ambulatory Visit: Payer: Self-pay

## 2017-09-26 ENCOUNTER — Encounter (HOSPITAL_COMMUNITY): Payer: Self-pay | Admitting: Radiology

## 2017-09-26 DIAGNOSIS — I959 Hypotension, unspecified: Secondary | ICD-10-CM | POA: Diagnosis not present

## 2017-09-26 DIAGNOSIS — E119 Type 2 diabetes mellitus without complications: Secondary | ICD-10-CM | POA: Diagnosis not present

## 2017-09-26 DIAGNOSIS — R0902 Hypoxemia: Secondary | ICD-10-CM | POA: Diagnosis not present

## 2017-09-26 DIAGNOSIS — R5381 Other malaise: Secondary | ICD-10-CM | POA: Diagnosis not present

## 2017-09-26 DIAGNOSIS — E1165 Type 2 diabetes mellitus with hyperglycemia: Secondary | ICD-10-CM | POA: Diagnosis not present

## 2017-09-26 DIAGNOSIS — R110 Nausea: Secondary | ICD-10-CM | POA: Diagnosis not present

## 2017-09-26 DIAGNOSIS — K5732 Diverticulitis of large intestine without perforation or abscess without bleeding: Secondary | ICD-10-CM | POA: Diagnosis not present

## 2017-09-26 DIAGNOSIS — R109 Unspecified abdominal pain: Secondary | ICD-10-CM | POA: Diagnosis not present

## 2017-09-26 DIAGNOSIS — I10 Essential (primary) hypertension: Secondary | ICD-10-CM | POA: Insufficient documentation

## 2017-09-26 DIAGNOSIS — R0602 Shortness of breath: Secondary | ICD-10-CM | POA: Diagnosis not present

## 2017-09-26 DIAGNOSIS — R1031 Right lower quadrant pain: Secondary | ICD-10-CM | POA: Diagnosis present

## 2017-09-26 DIAGNOSIS — R1084 Generalized abdominal pain: Secondary | ICD-10-CM | POA: Diagnosis not present

## 2017-09-26 DIAGNOSIS — K5900 Constipation, unspecified: Secondary | ICD-10-CM | POA: Diagnosis not present

## 2017-09-26 DIAGNOSIS — R52 Pain, unspecified: Secondary | ICD-10-CM | POA: Diagnosis not present

## 2017-09-26 DIAGNOSIS — Z79899 Other long term (current) drug therapy: Secondary | ICD-10-CM | POA: Diagnosis not present

## 2017-09-26 LAB — COMPREHENSIVE METABOLIC PANEL
ALT: 15 U/L (ref 14–54)
AST: 22 U/L (ref 15–41)
Albumin: 3.6 g/dL (ref 3.5–5.0)
Alkaline Phosphatase: 54 U/L (ref 38–126)
Anion gap: 7 (ref 5–15)
BUN: 31 mg/dL — ABNORMAL HIGH (ref 6–20)
CO2: 21 mmol/L — ABNORMAL LOW (ref 22–32)
Calcium: 8.6 mg/dL — ABNORMAL LOW (ref 8.9–10.3)
Chloride: 108 mmol/L (ref 101–111)
Creatinine, Ser: 1.07 mg/dL — ABNORMAL HIGH (ref 0.44–1.00)
GFR calc Af Amer: 57 mL/min — ABNORMAL LOW (ref >60)
GFR calc non Af Amer: 49 mL/min — ABNORMAL LOW (ref >60)
Glucose, Bld: 199 mg/dL — ABNORMAL HIGH (ref 65–99)
Potassium: 4.1 mmol/L (ref 3.5–5.1)
Sodium: 136 mmol/L (ref 135–145)
Total Bilirubin: 0.9 mg/dL (ref 0.3–1.2)
Total Protein: 6.4 g/dL — ABNORMAL LOW (ref 6.5–8.1)

## 2017-09-26 LAB — URINALYSIS, ROUTINE W REFLEX MICROSCOPIC
Bacteria, UA: NONE SEEN
Bilirubin Urine: NEGATIVE
Glucose, UA: NEGATIVE mg/dL
Hgb urine dipstick: NEGATIVE
Ketones, ur: 5 mg/dL — AB
Nitrite: NEGATIVE
Protein, ur: NEGATIVE mg/dL
Specific Gravity, Urine: 1.023 (ref 1.005–1.030)
pH: 6 (ref 5.0–8.0)

## 2017-09-26 LAB — CBC WITH DIFFERENTIAL/PLATELET
Basophils Absolute: 0 10*3/uL (ref 0.0–0.1)
Basophils Relative: 0 %
Eosinophils Absolute: 0.3 10*3/uL (ref 0.0–0.7)
Eosinophils Relative: 3 %
HCT: 32.7 % — ABNORMAL LOW (ref 36.0–46.0)
Hemoglobin: 11.5 g/dL — ABNORMAL LOW (ref 12.0–15.0)
Lymphocytes Relative: 14 %
Lymphs Abs: 1.4 10*3/uL (ref 0.7–4.0)
MCH: 33 pg (ref 26.0–34.0)
MCHC: 35.2 g/dL (ref 30.0–36.0)
MCV: 93.7 fL (ref 78.0–100.0)
Monocytes Absolute: 1.1 10*3/uL — ABNORMAL HIGH (ref 0.1–1.0)
Monocytes Relative: 11 %
Neutro Abs: 7.2 10*3/uL (ref 1.7–7.7)
Neutrophils Relative %: 72 %
Platelets: 221 10*3/uL (ref 150–400)
RBC: 3.49 MIL/uL — ABNORMAL LOW (ref 3.87–5.11)
RDW: 13.9 % (ref 11.5–15.5)
WBC: 9.9 10*3/uL (ref 4.0–10.5)

## 2017-09-26 LAB — LIPASE, BLOOD: Lipase: 32 U/L (ref 11–51)

## 2017-09-26 IMAGING — CR DG CHEST 2V
2 series · 2 of 2 positions shown · non-contrast
Comparison: [DATE]

CLINICAL DATA: Short of breath

EXAM:
CHEST - 2 VIEW

[w chest lat]
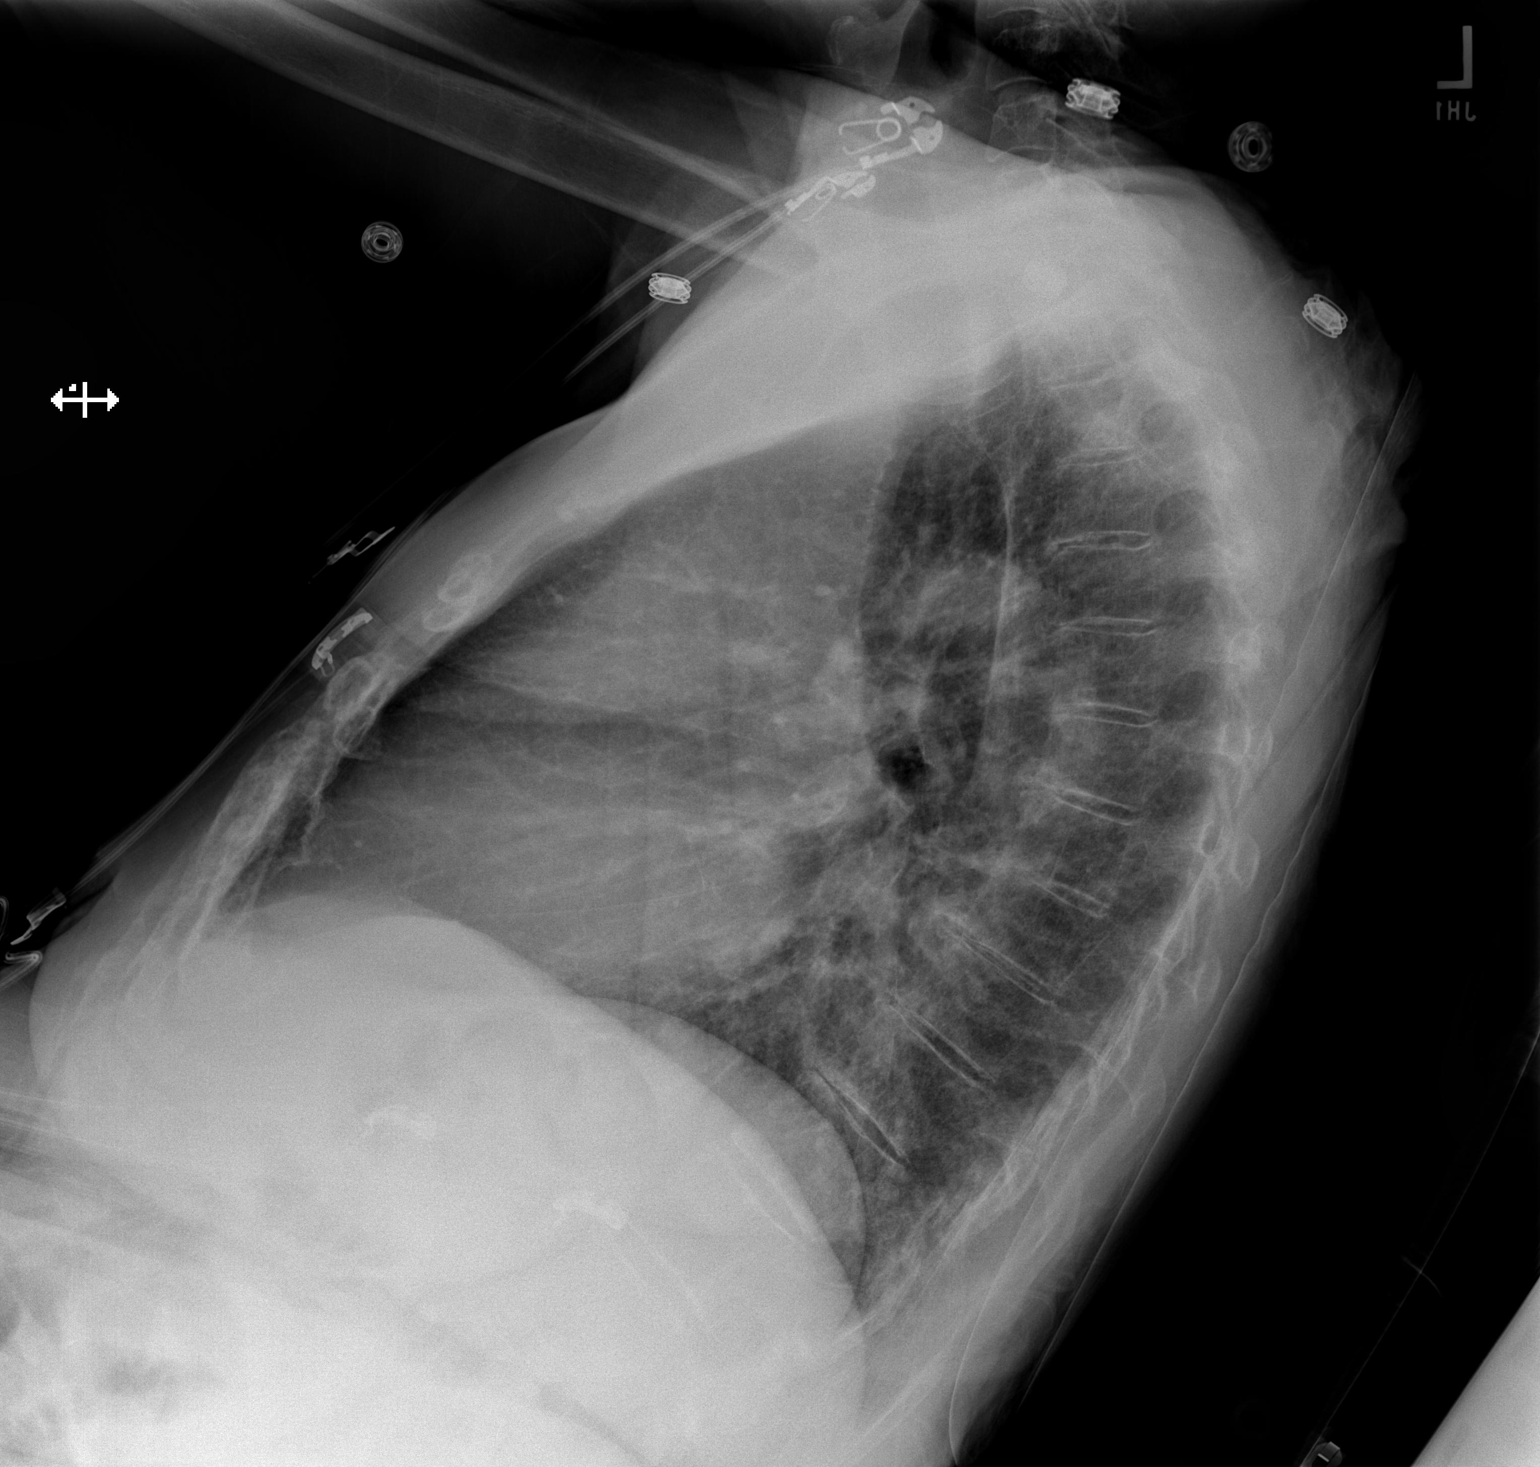

[x chest ap]
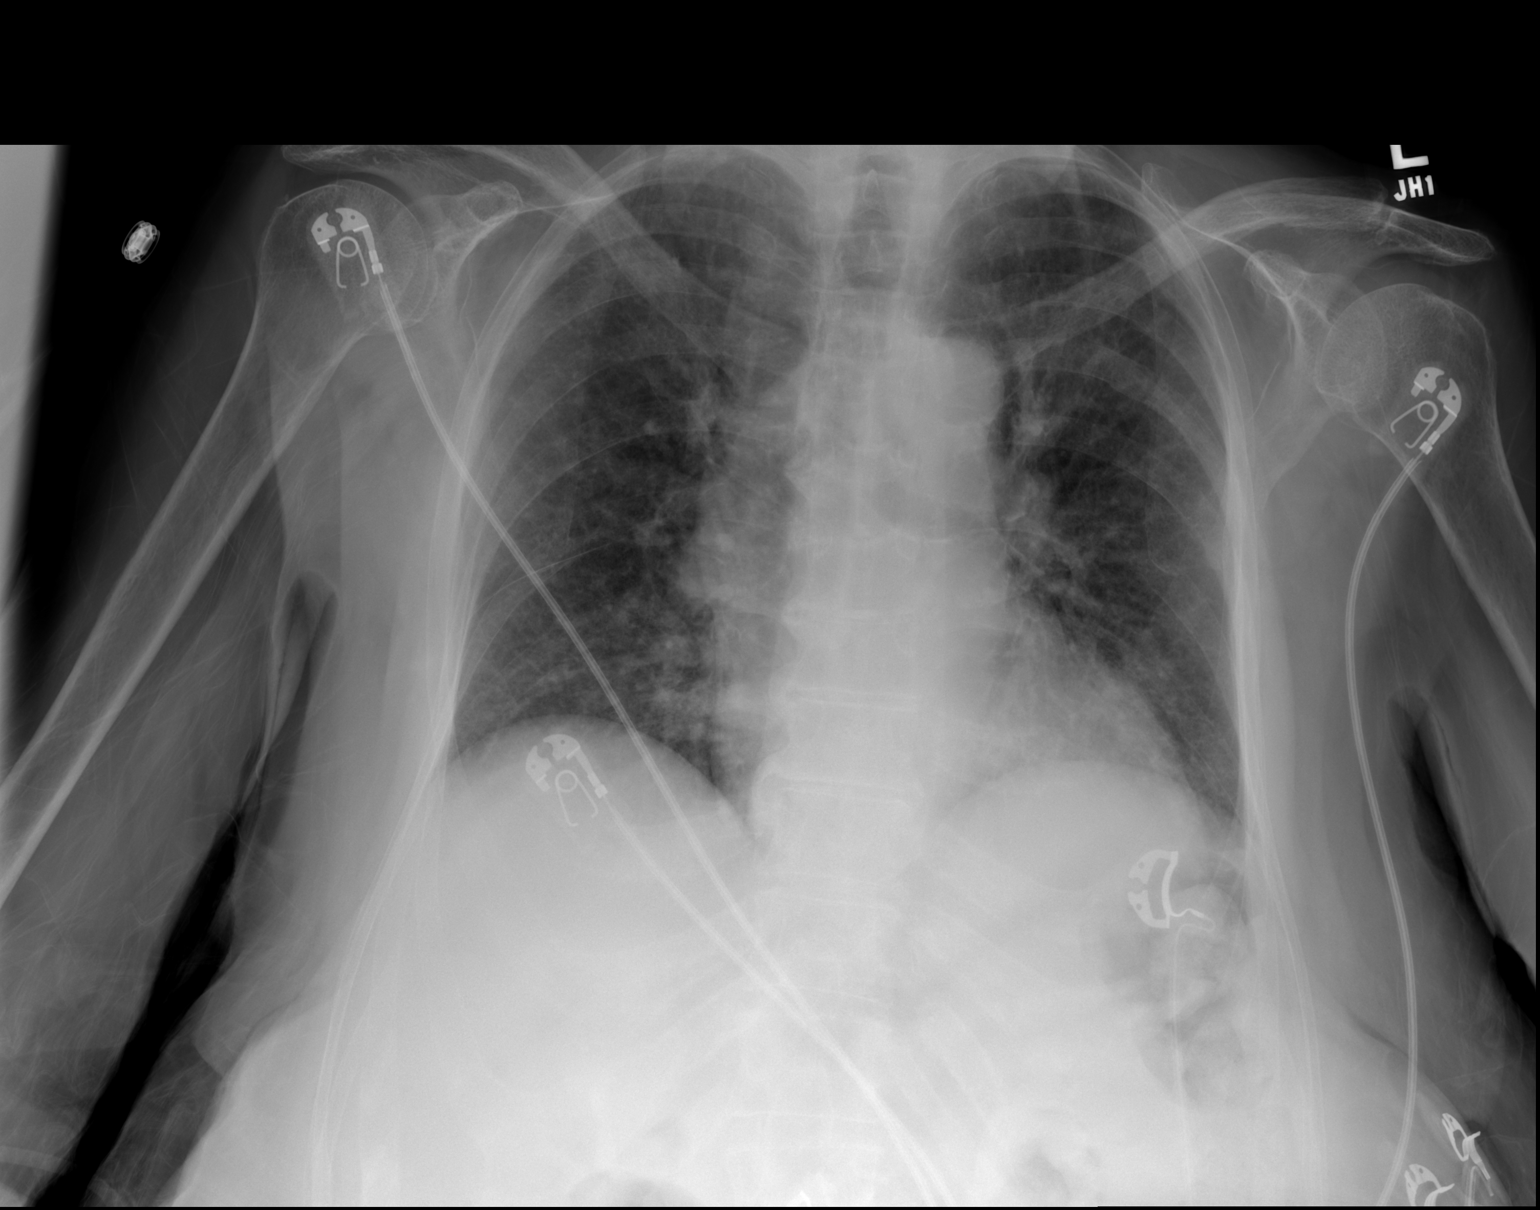

[2 of 2 positions shown; findings below may reference images not displayed]

FINDINGS: The heart size and mediastinal contours are within normal limits.
Both lungs are clear. The visualized skeletal structures are
unremarkable.
IMPRESSION: No active cardiopulmonary disease.

## 2017-09-26 IMAGING — CT CT ABD-PELV W/ CM
2 of 5 series · 16 of 46 positions shown, 18 images · IV contrast (ISOVUE)
Comparison: CT abdomen and pelvis [DATE].

CLINICAL DATA: Abdominal pain for 3 days.

EXAM:
CT ABDOMEN AND PELVIS WITH CONTRAST
TECHNIQUE: Multidetector CT imaging of the abdomen and pelvis was performed
using the standard protocol following bolus administration of
intravenous contrast.
CONTRAST:  100 ml [Y8] IOPAMIDOL ([Y8]) INJECTION 61%

[Series 2: axial st · axial · 0.94mm/px · z∈[+890,+1236]mm · 13 of 81 slices shown, 15 images]
[im 6/81  soft-tissue]
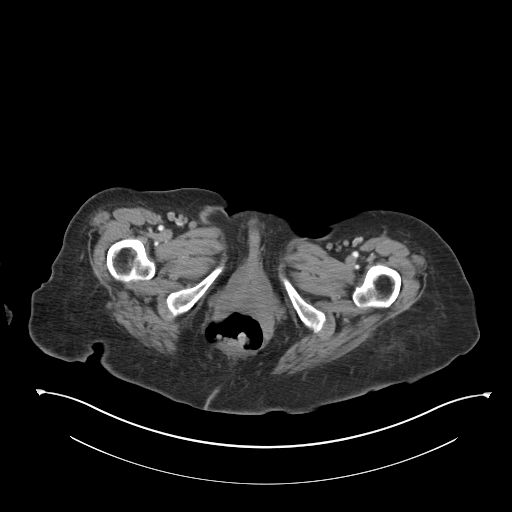
[im 6/81  bone]
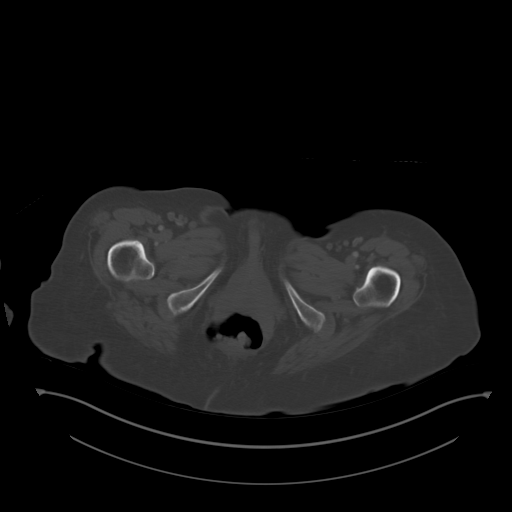
[im 12/81  soft-tissue]
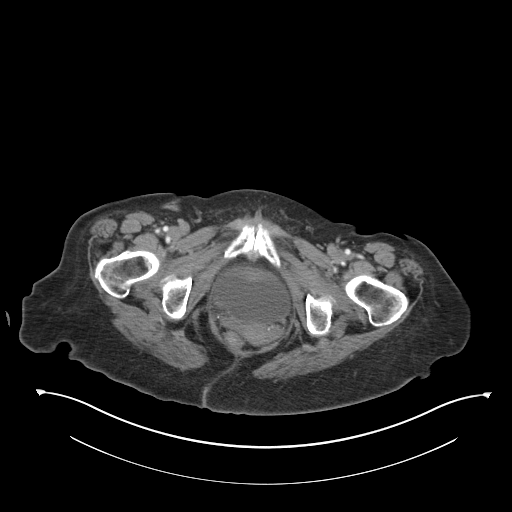
[im 18/81  soft-tissue]
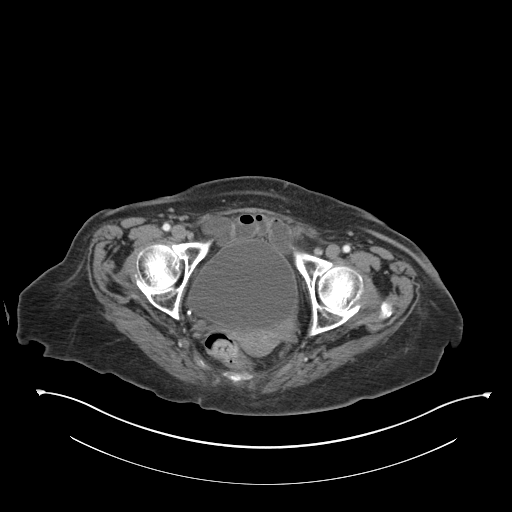
[im 23/81  soft-tissue]
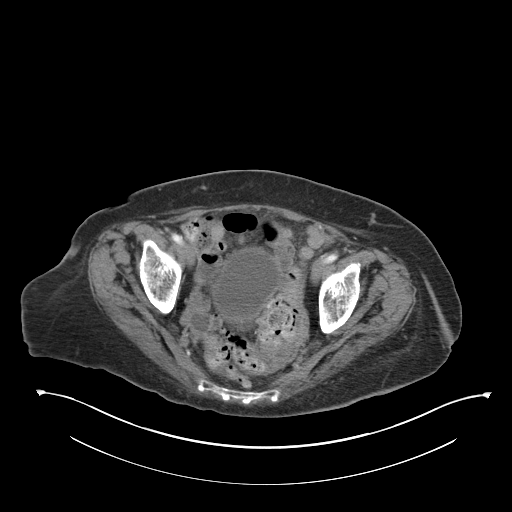
[im 29/81  soft-tissue]
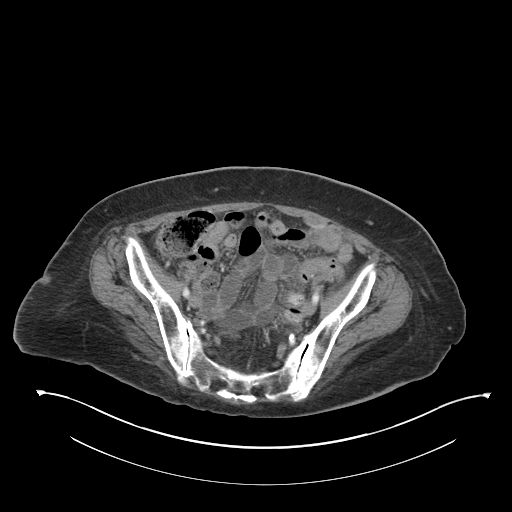
[im 35/81  soft-tissue]
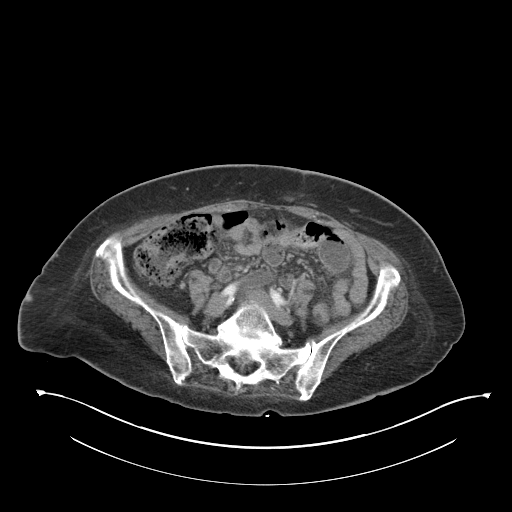
[im 41/81  soft-tissue]
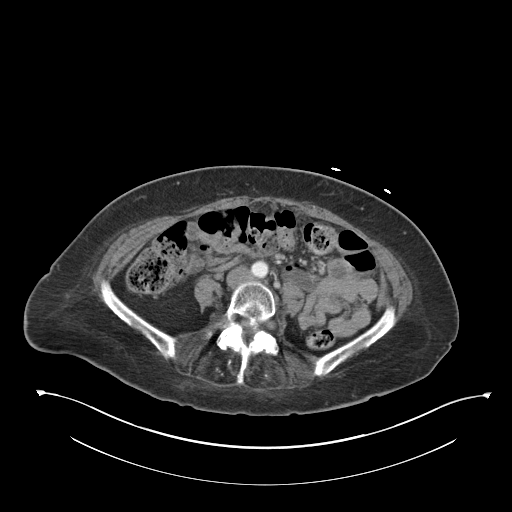
[im 46/81  soft-tissue]
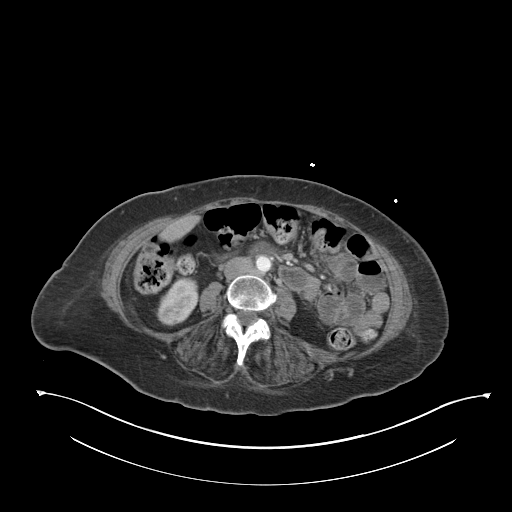
[im 52/81  soft-tissue]
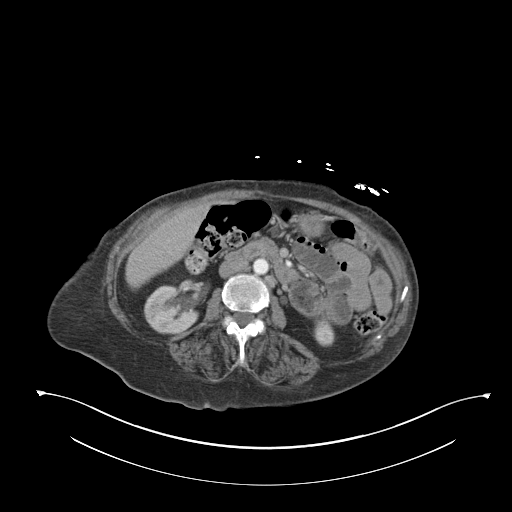
[im 52/81  bone]
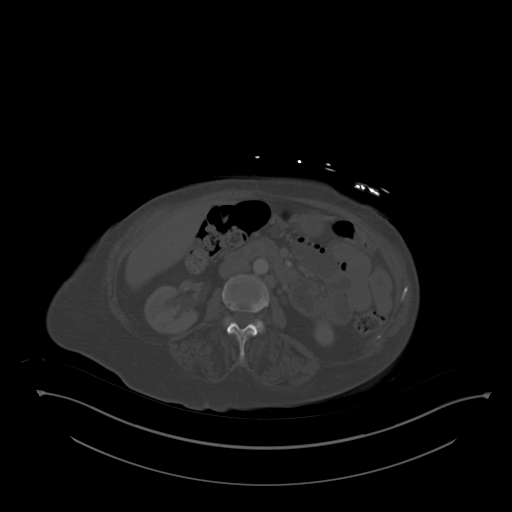
[im 58/81  soft-tissue]
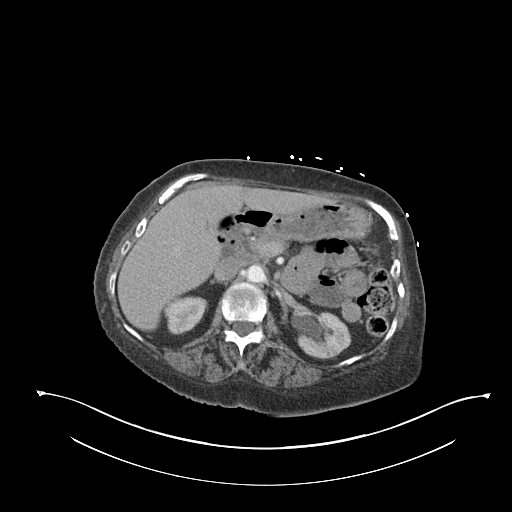
[im 63/81  soft-tissue]
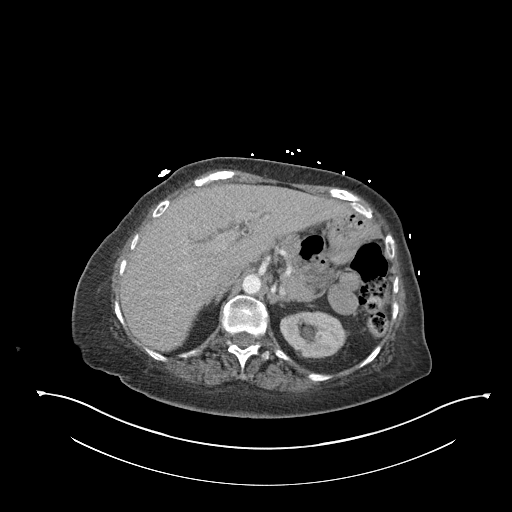
[im 69/81  soft-tissue]
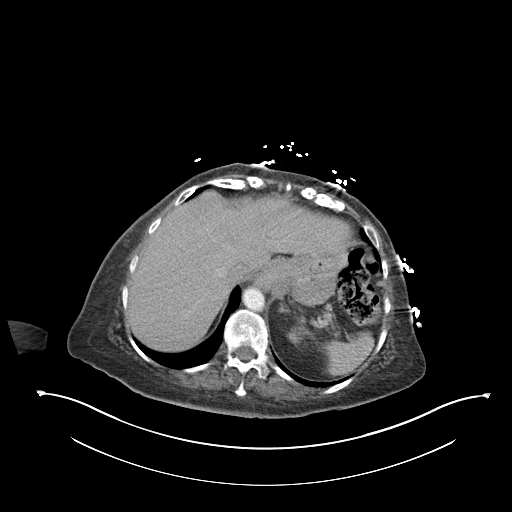
[im 75/81  soft-tissue]
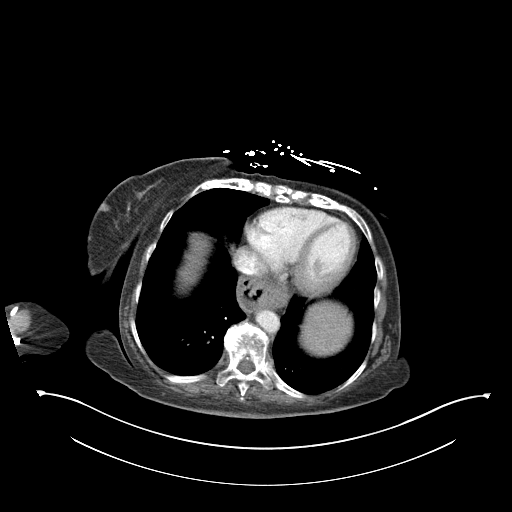

[Series 5: coronal st · coronal · 0.76mm/px · 3 of 84 slices shown]
[im 28/84  soft-tissue]
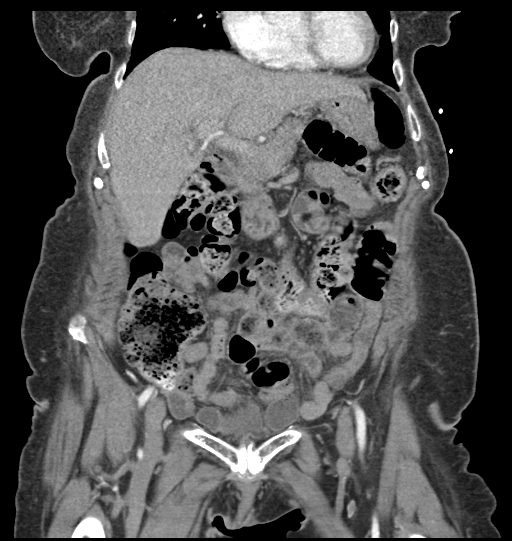
[im 37/84  soft-tissue]
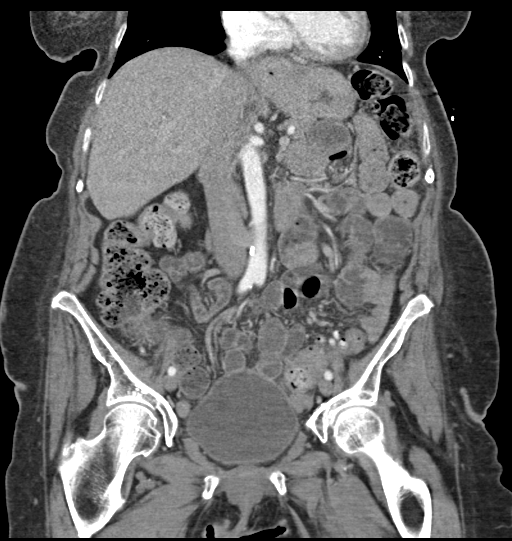
[im 47/84  soft-tissue]
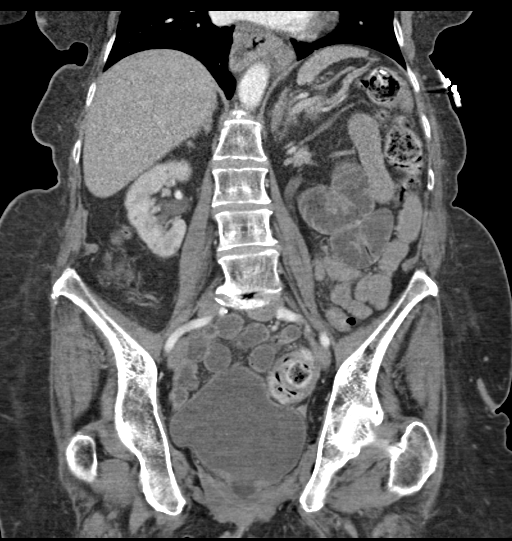

[16 of 46 positions shown; findings below may reference images not displayed]

FINDINGS: Lower chest: Lung bases are clear. No pleural or pericardial
effusion. Heart size is normal.

Hepatobiliary: No focal liver abnormality is seen. Status post
cholecystectomy. No biliary dilatation.

Pancreas: Unremarkable. No pancreatic ductal dilatation or
surrounding inflammatory changes.

Spleen: Normal in size without focal abnormality.

Adrenals/Urinary Tract: The adrenal glands appear normal. The
patient has mild left hydronephrosis which is new since the prior CT
scans. Mild fullness of the right intrarenal collecting system is
similar to that seen on the [Y8] CT. Bilateral renal cysts are
noted. Cystocele is noted.

Stomach/Bowel: Since the most recent CT scan, the patient has
developed a wall thickening in the mid to distal sigmoid colon.
Extensive sigmoid diverticulosis is present. The colon is otherwise
unremarkable. There is no small bowel obstruction. Moderate hiatal
hernia is seen. Rectocele is noted.

Vascular/Lymphatic: Aortic atherosclerosis. No enlarged abdominal or
pelvic lymph nodes.

Reproductive: Uterus and bilateral adnexa are unremarkable.

Other: No fluid collection is identified.

Musculoskeletal: Lumbar degenerative disease L4-5 and L5-S1 is seen.
No acute abnormality.
IMPRESSION: Extensive sigmoid diverticulosis with thickening of the walls of the
mid to distal sigmoid colon most consistent with acute
diverticulitis. Mild left hydronephrosis is likely related to
diverticulitis.

Negative for small bowel obstruction.

Moderate hiatal hernia.

Atherosclerosis.

Pelvic floor laxity with associated rectocele and cystocele as seen
on prior studies

## 2017-09-26 MED ORDER — AMOXICILLIN-POT CLAVULANATE 875-125 MG PO TABS: 1.0000 | ORAL_TABLET | Freq: Two times a day (BID) | ORAL | 0 refills | Status: AC

## 2017-09-26 MED ORDER — IOPAMIDOL (ISOVUE-300) INJECTION 61%
100.0000 mL | Freq: Once | INTRAVENOUS | Status: AC | PRN
  Administered 2017-09-26: 100 mL via INTRAVENOUS

## 2017-09-26 MED ORDER — AMOXICILLIN-POT CLAVULANATE 875-125 MG PO TABS
1.0000 | ORAL_TABLET | Freq: Once | ORAL | Status: AC
  Administered 2017-09-26: 1 via ORAL
  Filled 2017-09-26: qty 1

## 2017-09-26 MED ORDER — IOPAMIDOL (ISOVUE-300) INJECTION 61%
INTRAVENOUS | Status: AC
  Filled 2017-09-26: qty 100

## 2017-09-26 MED ORDER — SODIUM CHLORIDE 0.9 % IV BOLUS
1000.0000 mL | Freq: Once | INTRAVENOUS | Status: AC
  Administered 2017-09-26: 1000 mL via INTRAVENOUS

## 2017-09-26 NOTE — ED Provider Notes (Signed)
[facility]-EMERGENCY DEPT Provider Note   CSN: [id number] Arrival date & time: 09/26/17  1421     History   Chief Complaint Chief Complaint  Patient presents with  . Abdominal Pain    HPI Victoria Tyler is a 77 y.o. female with history of type 2 diabetes mellitus, hypoglycemia, colitis, hypertension presents for evaluation of acute onset, progressively worsening constipation for 3 days with associated abdominal pain.  She states she typically has a bowel movement every day if not every other day.  Has had intermittent right sided lower abdominal pain for the past 2 days which worsens with palpation and during bumps in the car ride to the ED.  Went to see her PCP earlier today who recommended presentation to the ED for rule out of bowel obstruction.  She has had a hernia repair several years ago but otherwise no other abdominal surgeries.  Endorses nausea but no vomiting.  Endorses generalized fatigue and shortness of breath but denies syncope or chest pain.  No fevers or chills.  States that she did have a very small amount of bright red blood when wiping after attempting to have a bowel movement 3 days ago but has not had any since.  She did try 1 tablespoon of milk of magnesia without relief of her symptoms.  Denies urinary symptoms, hematuria, dysuria.  The history is provided by the patient.    Past Medical History:  Diagnosis Date  . Diabetes mellitus without complication [facility])     Patient Active Problem List   Diagnosis Date Noted  . Hypoglycemia 10/30/2015  . Fall 10/30/2015  . Colitis 04/24/2014  . Essential hypertension 04/24/2014  . Controlled type 2 diabetes mellitus with microalbuminuria or microproteinuria 04/24/2014  . Hypotension 04/24/2014  . High anion gap metabolic acidosis 04/24/2014  . Acute kidney injury (HCC) 04/24/2014  . Hyponatremia 04/24/2014  . Normocytic anemia 04/24/2014  . Infectious colitis 04/24/2014    Past Surgical History:   Procedure Laterality Date  . HERNIA REPAIR    . TONSILLECTOMY       OB History   None      Home Medications    Prior to Admission medications   Medication Sig Start Date End Date Taking? Authorizing Provider  Ascorbic Acid (VITAMIN C) 1000 MG tablet Take 1,000 mg by mouth daily.   Yes [provider]  aspirin (GOODSENSE ASPIRIN) 81 MG chewable tablet Chew 81 mg by mouth daily.   Yes [provider]  beta carotene w/minerals (OCUVITE) tablet Take 1 tablet by mouth daily.   Yes [provider]  Calcium Carbonate-Vitamin D 600-400 MG-UNIT tablet Take 1 tablet by mouth daily.   Yes [provider]  Cholecalciferol (VITAMIN D3) 1000 units CAPS Take 1,000 Units by mouth daily.   Yes [provider]  lisinopril (PRINIVIL,ZESTRIL) 20 MG tablet Take 20 mg by mouth daily. 10/03/15  Yes [provider]  omeprazole (PRILOSEC) 40 MG capsule Take 40 mg by mouth 2 (two) times daily.    Yes [provider]  oxybutynin (DITROPAN) 5 MG tablet Take 5 mg by mouth 2 (two) times daily. 10/19/15  Yes [provider]  trimethoprim (TRIMPEX) 100 MG tablet Take 100 mg by mouth daily.   Yes [provider]  alendronate (FOSAMAX) 70 MG tablet Take 70 mg by mouth once a week.  03/24/14   [provider]  amoxicillin-clavulanate (AUGMENTIN) 875-125 MG tablet Take 1 tablet by mouth every 12 (twelve) hours for 10  days. 09/26/17 10/06/17  Michela PitcherFawze, Vihan Santagata A, PA-C  ibuprofen (ADVIL,MOTRIN) 800 MG tablet  09/26/17   [provider]    Family History Family History  Problem Relation Age of Onset  . Heart attack Father     Social History Social History   Tobacco Use  . Smoking status: Never Smoker  Substance Use Topics  . Alcohol use: No  . Drug use: No     Allergies   Ciprofloxacin   Review of Systems Review of Systems  Constitutional: Positive for chills and fatigue. Negative for fever.  Respiratory: Positive  for shortness of breath.   Cardiovascular: Negative for chest pain.  Gastrointestinal: Positive for abdominal pain, constipation and nausea. Negative for vomiting.  Genitourinary: Negative for dysuria and hematuria.  All other systems reviewed and are negative.    Physical Exam Updated Vital Signs BP 101/64   Pulse 69   Temp 98.9 F (37.2 C) (Oral)   Resp 20   Ht 4\' 9"  (1.448 m)   Wt 62.1 kg (137 lb)   SpO2 100%   BMI 29.65 kg/m   Physical Exam  Constitutional: She appears well-developed and well-nourished. No distress.  Resting in bed, somewhat uncomfortable in appearance  HENT:  Head: Normocephalic and atraumatic.  Eyes: Conjunctivae are normal. Right eye exhibits no discharge. Left eye exhibits no discharge.  Neck: No JVD present. No tracheal deviation present.  Cardiovascular: Normal rate, regular rhythm and normal heart sounds.  Pulmonary/Chest: She has wheezes.  Mildly tachypnea, equal rise and fall of chest.  Speaking in short phrases.  Mild scattered expiratory wheezing noted primarily on the right   Abdominal: Soft. Bowel sounds are normal. She exhibits no distension. There is tenderness in the right upper quadrant, right lower quadrant and suprapubic area. There is guarding. There is no rigidity, no rebound, no CVA tenderness, no tenderness at McBurney's point and negative Murphy's sign.  Large surgical scar noted to the right side of the abdomen  Musculoskeletal: She exhibits no edema.  No midline spine TTP, no paraspinal muscle tenderness, no deformity, crepitus, or step-off noted   Neurological: She is alert.  Skin: Skin is warm and dry. No erythema.  Psychiatric: She has a normal mood and affect. Her behavior is normal.  Nursing note and vitals reviewed.    ED Treatments / Results  Labs (all labs ordered are listed, but only abnormal results are displayed) Labs Reviewed  CBC WITH DIFFERENTIAL/PLATELET - Abnormal; Notable for the following components:       Result Value   RBC 3.49 (*)    Hemoglobin 11.5 (*)    HCT 32.7 (*)    Monocytes Absolute 1.1 (*)    All other components within normal limits  COMPREHENSIVE METABOLIC PANEL - Abnormal; Notable for the following components:   CO2 21 (*)    Glucose, Bld 199 (*)    BUN 31 (*)    Creatinine, Ser 1.07 (*)    Calcium 8.6 (*)    Total Protein 6.4 (*)    GFR calc non Af Amer 49 (*)    GFR calc Af Amer 57 (*)    All other components within normal limits  URINALYSIS, ROUTINE W REFLEX MICROSCOPIC - Abnormal; Notable for the following components:   Ketones, ur 5 (*)    Leukocytes, UA MODERATE (*)    Non Squamous Epithelial 0-5 (*)    All other components within normal limits  LIPASE, BLOOD    EKG None  Radiology Dg Chest 2  View  Result Date: 09/26/2017 CLINICAL DATA:  Short of breath EXAM: CHEST - 2 VIEW COMPARISON:  04/24/2014 FINDINGS: The heart size and mediastinal contours are within normal limits. Both lungs are clear. The visualized skeletal structures are unremarkable. IMPRESSION: No active cardiopulmonary disease. Electronically Signed   By: Marlan Palau M.D.   On: 09/26/2017 17:37   Ct Abdomen Pelvis W Contrast  Result Date: 09/26/2017 CLINICAL DATA:  Abdominal pain for 3 days. EXAM: CT ABDOMEN AND PELVIS WITH CONTRAST TECHNIQUE: Multidetector CT imaging of the abdomen and pelvis was performed using the standard protocol following bolus administration of intravenous contrast. CONTRAST:  100 ml ISOVUE-300 IOPAMIDOL (ISOVUE-300) INJECTION 61% COMPARISON:  CT abdomen and pelvis 05/01/2017. FINDINGS: Lower chest: Lung bases are clear. No pleural or pericardial effusion. Heart size is normal. Hepatobiliary: No focal liver abnormality is seen. Status post cholecystectomy. No biliary dilatation. Pancreas: Unremarkable. No pancreatic ductal dilatation or surrounding inflammatory changes. Spleen: Normal in size without focal abnormality. Adrenals/Urinary Tract: The adrenal glands appear  normal. The patient has mild left hydronephrosis which is new since the prior CT scans. Mild fullness of the right intrarenal collecting system is similar to that seen on the 2016 CT. Bilateral renal cysts are noted. Cystocele is noted. Stomach/Bowel: Since the most recent CT scan, the patient has developed a wall thickening in the mid to distal sigmoid colon. Extensive sigmoid diverticulosis is present. The colon is otherwise unremarkable. There is no small bowel obstruction. Moderate hiatal hernia is seen. Rectocele is noted. Vascular/Lymphatic: Aortic atherosclerosis. No enlarged abdominal or pelvic lymph nodes. Reproductive: Uterus and bilateral adnexa are unremarkable. Other: No fluid collection is identified. Musculoskeletal: Lumbar degenerative disease L4-5 and L5-S1 is seen. No acute abnormality. IMPRESSION: Extensive sigmoid diverticulosis with thickening of the walls of the mid to distal sigmoid colon most consistent with acute diverticulitis. Mild left hydronephrosis is likely related to diverticulitis. Negative for small bowel obstruction. Moderate hiatal hernia. Atherosclerosis. Pelvic floor laxity with associated rectocele and cystocele as seen on prior studies Electronically Signed   By: Drusilla Kanner M.D.   On: 09/26/2017 17:40    Procedures Procedures (including critical care time)  Medications Ordered in ED Medications  sodium chloride 0.9 % bolus 1,000 mL (0 mLs Intravenous Stopped 09/26/17 1807)  iopamidol (ISOVUE-300) 61 % injection 100 mL (100 mLs Intravenous Contrast Given 09/26/17 1641)  amoxicillin-clavulanate (AUGMENTIN) 875-125 MG per tablet 1 tablet (1 tablet Oral Given 09/26/17 1850)     Initial Impression / Assessment and Plan / ED Course  I have reviewed the triage vital signs and the nursing notes.  Pertinent labs & imaging results that were available during my care of the patient were reviewed by me and considered in my medical decision making (see chart for  details).    History probably with patient presents with 3-day history of abdominal pain and constipation.  She is afebrile, borderline hypotensive but tells me that this is her baseline.  She is intermittently tachypneic initially.  She is uncomfortable but nontoxic in appearance.  Lab work reviewed by me shows no leukocytosis, stable anemia very mildly elevated creatinine of 1.07 at the patient's baseline.  No significant electrolyte abnormalities.  Lipase and LFTs are within normal limits without.  UA is not suggestive of nephrolithiasis or UTI. Chest x-ray shows no acute cardiopulmonary abnormalities.  CT scan of the abdomen and pelvis consistent with acute diverticulitis with no evidence of abscess or perforation.  The patient was given IV fluids on reevaluation she states  she feels much better.  Serial abdominal examinations remain benign, she is tolerating p.o. food and fluids without difficulty.  Chest x-ray shows no acute cardiopulmonary abnormalities.  Patient states that she would like to go home and I think this is reasonable given her presentation and findings at this time.  I doubt obstruction, perforation, appendicitis, AAA, TOA, ovarian torsion, or other acute surgical abdominal pathology.  She was given her first dose of Augmentin in the ED which she tolerated without difficulty.  Will discharge with 10-day course of Augmentin and follow-up with her PCP.  Discussed strict ED return precautions.  Patient and patient's family verbalized understanding of and agreement with plan and patient is stable for discharge home at this time.  Final Clinical Impressions(s) / ED Diagnoses   Final diagnoses:  Diverticulitis of large intestine without perforation or abscess without bleeding    ED Discharge Orders        Ordered    amoxicillin-clavulanate (AUGMENTIN) 875-125 MG tablet  Every 12 hours     09/26/17 1933       Jeanie Sewer, PA-C 09/27/17 0125    Cathren Laine, MD 09/27/17  [phone]

## 2017-09-26 NOTE — ED Triage Notes (Signed)
Patient arrives via EMS with r/o bowel obstruction. Patient seen at physicians office today. Patient reports 3 days lower abdominal pain, +bowel movements, - bleeding. Hx diabetes.  BP: 100/64 HR- 78 O2-100% RR- 24 BGL 256

## 2017-09-26 NOTE — Discharge Instructions (Signed)
1. Medications: Please take all of your antibiotics until finished!   You may develop abdominal discomfort or diarrhea from the antibiotic.  You may help offset this with probiotics which you can buy or get in yogurt. Do not eat  or take the probiotics until 2 hours after your antibiotic.  2. Treatment: rest, drink plenty of fluids, advance diet slowly.  Start with water and broth then advance to bland foods that will not upset your stomach such as crackers, mashed potatoes, and peanut butter. 3. Follow Up: Please call your primary care doctor's office tomorrow or Monday for discussion of your diagnoses and further evaluation after today's visit; your PCP may want to see you in the office this week or in several weeks; Please return to the ER for persistent vomiting, high fevers or worsening symptoms

## 2017-10-09 DIAGNOSIS — K5792 Diverticulitis of intestine, part unspecified, without perforation or abscess without bleeding: Secondary | ICD-10-CM | POA: Diagnosis not present

## 2017-11-05 DIAGNOSIS — K5909 Other constipation: Secondary | ICD-10-CM | POA: Diagnosis not present

## 2018-01-12 DIAGNOSIS — Z0001 Encounter for general adult medical examination with abnormal findings: Secondary | ICD-10-CM | POA: Diagnosis not present

## 2018-01-12 DIAGNOSIS — I10 Essential (primary) hypertension: Secondary | ICD-10-CM | POA: Diagnosis not present

## 2018-01-12 DIAGNOSIS — R634 Abnormal weight loss: Secondary | ICD-10-CM | POA: Diagnosis not present

## 2018-01-12 DIAGNOSIS — E11649 Type 2 diabetes mellitus with hypoglycemia without coma: Secondary | ICD-10-CM | POA: Diagnosis not present

## 2018-03-05 DIAGNOSIS — I10 Essential (primary) hypertension: Secondary | ICD-10-CM | POA: Diagnosis not present

## 2018-03-05 DIAGNOSIS — R634 Abnormal weight loss: Secondary | ICD-10-CM | POA: Diagnosis not present

## 2018-03-23 DIAGNOSIS — Z961 Presence of intraocular lens: Secondary | ICD-10-CM | POA: Diagnosis not present

## 2018-03-23 DIAGNOSIS — H35372 Puckering of macula, left eye: Secondary | ICD-10-CM | POA: Diagnosis not present

## 2018-03-23 DIAGNOSIS — H01003 Unspecified blepharitis right eye, unspecified eyelid: Secondary | ICD-10-CM | POA: Diagnosis not present

## 2018-03-23 DIAGNOSIS — E119 Type 2 diabetes mellitus without complications: Secondary | ICD-10-CM | POA: Diagnosis not present

## 2018-04-14 DIAGNOSIS — Z1231 Encounter for screening mammogram for malignant neoplasm of breast: Secondary | ICD-10-CM | POA: Diagnosis not present

## 2018-04-23 DIAGNOSIS — I10 Essential (primary) hypertension: Secondary | ICD-10-CM | POA: Diagnosis not present

## 2018-05-11 DIAGNOSIS — H6592 Unspecified nonsuppurative otitis media, left ear: Secondary | ICD-10-CM | POA: Diagnosis not present

## 2018-05-15 DIAGNOSIS — R300 Dysuria: Secondary | ICD-10-CM | POA: Diagnosis not present

## 2018-05-15 DIAGNOSIS — R42 Dizziness and giddiness: Secondary | ICD-10-CM | POA: Diagnosis not present

## 2018-05-15 DIAGNOSIS — H9202 Otalgia, left ear: Secondary | ICD-10-CM | POA: Diagnosis not present

## 2018-06-24 DIAGNOSIS — Z8639 Personal history of other endocrine, nutritional and metabolic disease: Secondary | ICD-10-CM | POA: Diagnosis not present

## 2018-06-24 DIAGNOSIS — Z8679 Personal history of other diseases of the circulatory system: Secondary | ICD-10-CM | POA: Diagnosis not present

## 2018-06-24 DIAGNOSIS — R634 Abnormal weight loss: Secondary | ICD-10-CM | POA: Diagnosis not present

## 2018-12-02 ENCOUNTER — Other Ambulatory Visit: Payer: Self-pay

## 2018-12-02 ENCOUNTER — Emergency Department (HOSPITAL_COMMUNITY)
Admission: EM | Admit: 2018-12-02 | Discharge: 2018-12-02 | Disposition: A | Discharge status: Home / Self Care | Payer: PPO | Attending: Emergency Medicine | Admitting: Emergency Medicine

## 2018-12-02 DIAGNOSIS — R52 Pain, unspecified: Secondary | ICD-10-CM | POA: Diagnosis not present

## 2018-12-02 DIAGNOSIS — I10 Essential (primary) hypertension: Secondary | ICD-10-CM | POA: Diagnosis not present

## 2018-12-02 DIAGNOSIS — Z79899 Other long term (current) drug therapy: Secondary | ICD-10-CM | POA: Insufficient documentation

## 2018-12-02 DIAGNOSIS — M2550 Pain in unspecified joint: Secondary | ICD-10-CM | POA: Diagnosis not present

## 2018-12-02 DIAGNOSIS — E119 Type 2 diabetes mellitus without complications: Secondary | ICD-10-CM | POA: Diagnosis not present

## 2018-12-02 DIAGNOSIS — Z7982 Long term (current) use of aspirin: Secondary | ICD-10-CM | POA: Insufficient documentation

## 2018-12-02 MED ORDER — ACETAMINOPHEN 500 MG PO TABS: 1000.0000 mg | ORAL_TABLET | Freq: Three times a day (TID) | ORAL | 0 refills | Status: AC

## 2018-12-02 NOTE — ED Triage Notes (Signed)
Pt states she takes a walk every day w/o a problem states that yesterday began to have joint pain after her morning walk that increases with movement.

## 2018-12-02 NOTE — ED Notes (Signed)
Pt ambulated to restroom with no assistance. Gait steady 

## 2018-12-02 NOTE — ED Provider Notes (Signed)
[facility] DEPT Provider Note  CSN: [id number] Arrival date & time: 12/02/18 0451  Chief Complaint(s) Joint Pain  HPI Victoria Tyler is a 78 y.o. female with past medical history listed below including osteopenia who presents to the emergency department for polyarthralgia that is been intermittent for "a long time" and related to arthritis.  Patient reports that she had a flare of this yesterday after walking.  States that she has been taking Motrin with minimal relief.  Pain is worse with movement.  Improved with being still.  Scheduled an appointment with her primary care provider later on this morning was unable to take the pain.  She denied any falls or trauma.  No recent fevers or infections.  Reports that her pain has now completely resolved while in route by EMS.  HPI  Past Medical History Past Medical History:  Diagnosis Date  . Diabetes mellitus without complication [facility])    Patient Active Problem List   Diagnosis Date Noted  . Hypoglycemia 10/30/2015  . Fall 10/30/2015  . Colitis 04/24/2014  . Essential hypertension 04/24/2014  . Controlled type 2 diabetes mellitus with microalbuminuria or microproteinuria 04/24/2014  . Hypotension 04/24/2014  . High anion gap metabolic acidosis 62/83/6629  . Acute kidney injury ([place]) 04/24/2014  . Hyponatremia 04/24/2014  . Normocytic anemia 04/24/2014  . Infectious colitis 04/24/2014   Home Medication(s) Prior to Admission medications   Medication Sig Start Date End Date Taking? Authorizing Provider  acetaminophen (TYLENOL) 500 MG tablet Take 2 tablets (1,000 mg total) by mouth every 8 (eight) hours for 5 days. Do not take more than 4000 mg of acetaminophen (Tylenol) in a 24-hour period. Please note that other medicines that you may be prescribed may have Tylenol as well. 12/02/18 12/07/18  Fatima Blank, MD  alendronate (FOSAMAX) 70 MG tablet Take 70 mg by mouth once a week.  03/24/14   [provider]  Ascorbic Acid (VITAMIN C) 1000 MG tablet Take 1,000 mg by mouth daily.    [provider]  aspirin (GOODSENSE ASPIRIN) 81 MG chewable tablet Chew 81 mg by mouth daily.    [provider]  beta carotene w/minerals (OCUVITE) tablet Take 1 tablet by mouth daily.    [provider]  Calcium Carbonate-Vitamin D 600-400 MG-UNIT tablet Take 1 tablet by mouth daily.    [provider]  Cholecalciferol (VITAMIN D3) 1000 units CAPS Take 1,000 Units by mouth daily.    [provider]  ibuprofen (ADVIL,MOTRIN) 800 MG tablet  09/26/17   [provider]  lisinopril (PRINIVIL,ZESTRIL) 20 MG tablet Take 20 mg by mouth daily. 10/03/15   [provider]  omeprazole (PRILOSEC) 40 MG capsule Take 40 mg by mouth 2 (two) times daily.     [provider]  oxybutynin (DITROPAN) 5 MG tablet Take 5 mg by mouth 2 (two) times daily. 10/19/15   [provider]  trimethoprim (TRIMPEX) 100 MG tablet Take 100 mg by mouth daily.    [provider]  Past Surgical History Past Surgical History:  Procedure Laterality Date  . HERNIA REPAIR    . TONSILLECTOMY     Family History Family History  Problem Relation Age of Onset  . Heart attack Father     Social History Social History   Tobacco Use  . Smoking status: Never Smoker  Substance Use Topics  . Alcohol use: No  . Drug use: No   Allergies Ciprofloxacin  Review of Systems Review of Systems All other systems are reviewed and are negative for acute change except as noted in the HPI  Physical Exam Vital Signs  I have reviewed the triage vital signs BP (!) 118/53 (BP Location: Left Arm)   Pulse (!) 58   Temp 97.8 F (36.6 C) (Oral)   Resp 15   Ht 4\' 9"  (1.448 m)   Wt 56.2 kg   SpO2 99%   BMI 26.83 kg/m   Physical Exam  Vitals signs reviewed.  Constitutional:      General: She is not in acute distress.    Appearance: She is well-developed. She is not diaphoretic.  HENT:     Head: Normocephalic and atraumatic.     Right Ear: External ear normal.     Left Ear: External ear normal.     Nose: Nose normal.  Eyes:     General: No scleral icterus.    Conjunctiva/sclera: Conjunctivae normal.  Neck:     Musculoskeletal: Normal range of motion.     Trachea: Phonation normal.  Cardiovascular:     Rate and Rhythm: Normal rate and regular rhythm.  Pulmonary:     Effort: Pulmonary effort is normal. No respiratory distress.     Breath sounds: No stridor.  Abdominal:     General: There is no distension.  Musculoskeletal: Normal range of motion.     Comments: No joint TTP. Moves and ambulated w/o complication.  Neurological:     Mental Status: She is alert and oriented to person, place, and time.  Psychiatric:        Behavior: Behavior normal.     ED Results and Treatments Labs (all labs ordered are listed, but only abnormal results are displayed) Labs Reviewed - No data to display                                                                                                                       EKG  EKG Interpretation  Date/Time:    Ventricular Rate:    PR Interval:    QRS Duration:   QT Interval:    QTC Calculation:   R Axis:     Text Interpretation:        Radiology No results found.  Pertinent labs & imaging results that were available during my care of the patient were reviewed by me and considered in my medical decision making (see chart for details).  Medications Ordered in ED Medications - No data to display  Procedures Procedures  (including critical care time)  Medical Decision Making / ED Course I have reviewed the nursing notes for this encounter  and the patient's prior records (if available in EHR or on provided paperwork).   Victoria GardenerSandra Tyler was evaluated in Emergency Department on 12/02/2018 for the symptoms described in the history of present illness. She was evaluated in the context of the global COVID-19 pandemic, which necessitated consideration that the patient might be at risk for infection with the SARS-CoV-2 virus that causes COVID-19. Institutional protocols and algorithms that pertain to the evaluation of patients at risk for COVID-19 are in a state of rapid change based on information released by regulatory bodies including the CDC and federal and state organizations. These policies and algorithms were followed during the patient's care in the ED.  Resolved polyarthralgia. Declined pain meds here. No trauma, thus no imaging necessary.   The patient appears reasonably screened and/or stabilized for discharge and I doubt any other medical condition or other [facility] requiring further screening, evaluation, or treatment in the ED at this time prior to discharge.  The patient is safe for discharge with strict return precautions.       Final Clinical Impression(s) / ED Diagnoses Final diagnoses:  Polyarthralgia     The patient appears reasonably screened and/or stabilized for discharge and I doubt any other medical condition or other [facility] requiring further screening, evaluation, or treatment in the ED at this time prior to discharge.  Disposition: Discharge  Condition: Good  I have discussed the results, Dx and Tx plan with the patient who expressed understanding and agree(s) with the plan. Discharge instructions discussed at great length. The patient was given strict return precautions who verbalized understanding of the instructions. No further questions at time of discharge.    ED Discharge Orders         Ordered    acetaminophen (TYLENOL) 500 MG tablet  Every 8 hours     12/02/18 0610            Follow Up: Verlon AuBoyd,  Tammy Lamonica, MD [address] [place] [id number] [phone]  Schedule an appointment as soon as possible for a visit  As needed     This chart was dictated using voice recognition software.  Despite best efforts to proofread,  errors can occur which can change the documentation meaning.   Nira Connardama, Areyanna Figeroa Eduardo, MD 12/02/18 [phone]

## 2018-12-16 DIAGNOSIS — M130 Polyarthritis, unspecified: Secondary | ICD-10-CM | POA: Diagnosis not present

## 2018-12-16 DIAGNOSIS — M810 Age-related osteoporosis without current pathological fracture: Secondary | ICD-10-CM | POA: Diagnosis not present

## 2018-12-16 DIAGNOSIS — Z9181 History of falling: Secondary | ICD-10-CM | POA: Diagnosis not present

## 2018-12-16 DIAGNOSIS — R799 Abnormal finding of blood chemistry, unspecified: Secondary | ICD-10-CM | POA: Diagnosis not present

## 2018-12-23 DIAGNOSIS — M791 Myalgia, unspecified site: Secondary | ICD-10-CM | POA: Diagnosis not present

## 2018-12-23 DIAGNOSIS — M2550 Pain in unspecified joint: Secondary | ICD-10-CM | POA: Diagnosis not present

## 2018-12-23 DIAGNOSIS — R531 Weakness: Secondary | ICD-10-CM | POA: Diagnosis not present

## 2019-01-12 DIAGNOSIS — R413 Other amnesia: Secondary | ICD-10-CM | POA: Diagnosis not present

## 2019-01-12 DIAGNOSIS — M353 Polymyalgia rheumatica: Secondary | ICD-10-CM | POA: Diagnosis not present

## 2019-01-12 DIAGNOSIS — K5900 Constipation, unspecified: Secondary | ICD-10-CM | POA: Diagnosis not present

## 2019-01-14 ENCOUNTER — Other Ambulatory Visit: Payer: Self-pay | Admitting: Family Medicine

## 2019-01-14 DIAGNOSIS — R413 Other amnesia: Secondary | ICD-10-CM

## 2019-01-16 ENCOUNTER — Ambulatory Visit
Admission: RE | Admit: 2019-01-16 | Discharge: 2019-01-16 | Disposition: A | Discharge status: Home / Self Care | Payer: PPO | Source: Ambulatory Visit | Attending: Family Medicine | Admitting: Family Medicine

## 2019-01-16 ENCOUNTER — Other Ambulatory Visit: Payer: Self-pay

## 2019-01-16 DIAGNOSIS — R413 Other amnesia: Secondary | ICD-10-CM

## 2019-01-16 IMAGING — MR MR HEAD W/O CM
11 series · 47 of 48 positions shown · non-contrast
Comparison: Head CT [DATE].

CLINICAL DATA: 78-year-old female with memory loss for 6 months to
1 year. No known injury.

EXAM:
MRI HEAD WITHOUT CONTRAST
TECHNIQUE: Multiplanar, multiecho pulse sequences of the brain and surrounding
structures were obtained without intravenous contrast.

[Series 3: T1 · sagittal · 5.0mm · 0.45mm/px · 1 of 21 slices shown]
[im 1/21]
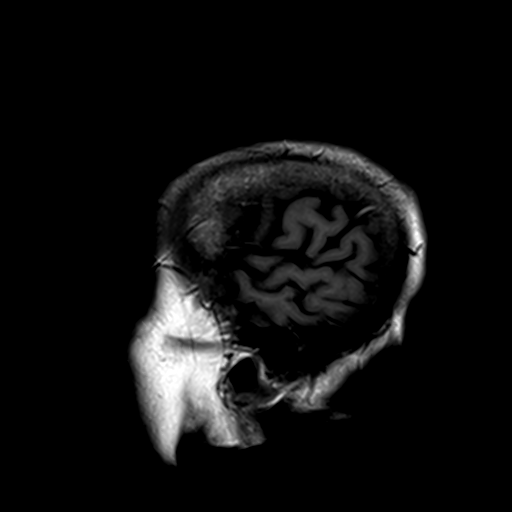

[Series 4: DWI · axial · 3.0mm · 1.80mm/px · z∈[-105,+42]mm · 7 of 100 slices shown (1 of 4)]
[im 1/100]
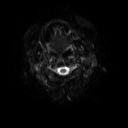
[im 17/100]
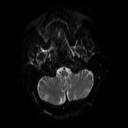
[im 34/100]
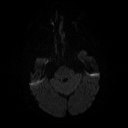
[im 50/100]
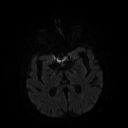
[im 67/100]
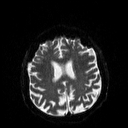
[im 83/100]
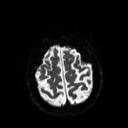
[im 100/100]
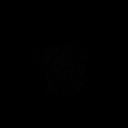

[Series 5: DWI · axial · 3.0mm · 1.80mm/px · z∈[-105,+42]mm · 4 of 49 slices shown (2 of 4)]
[im 1/49]
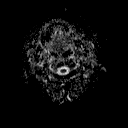
[im 17/49]
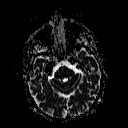
[im 33/49]
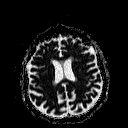
[im 49/49]
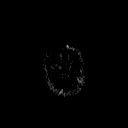

[Series 6: mip_images(sw) · axial · 16.0mm · 0.90mm/px · z∈[-104,+40]mm · 5 of 73 slices shown]
[im 1/73]
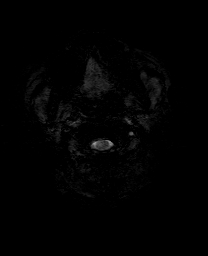
[im 19/73]
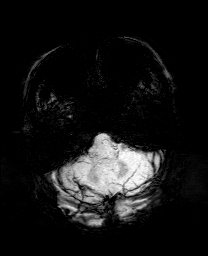
[im 37/73]
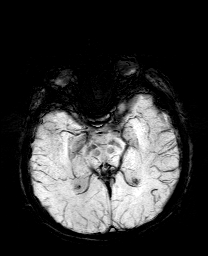
[im 55/73]
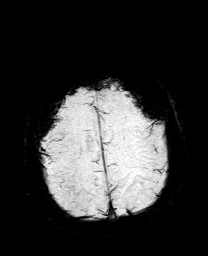
[im 73/73]
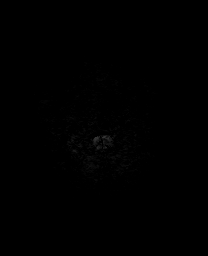

[Series 7: swi_images · axial · 2.0mm · 0.90mm/px · z∈[-111,+47]mm · 6 of 80 slices shown]
[im 1/80]
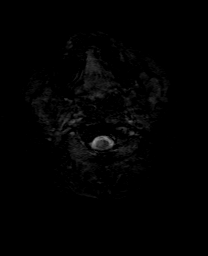
[im 16/80]
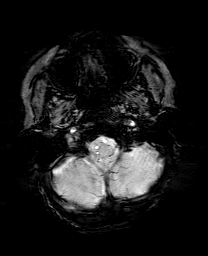
[im 32/80]
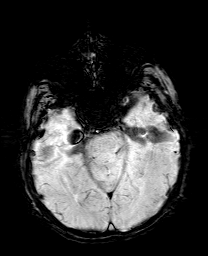
[im 48/80]
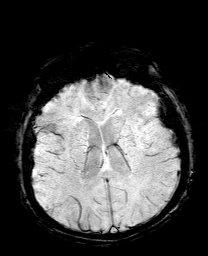
[im 64/80]
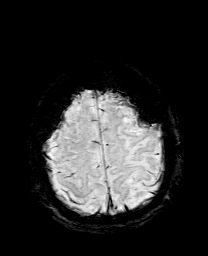
[im 80/80]
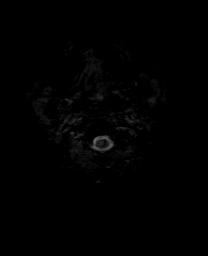

[Series 8: DWI · coronal · 5.0mm · 1.80mm/px · 5 of 68 slices shown (3 of 4)]
[im 1/68]
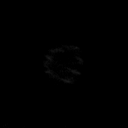
[im 17/68]
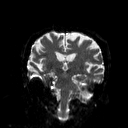
[im 34/68]
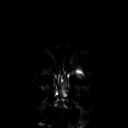
[im 51/68]
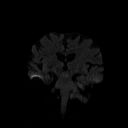
[im 68/68]
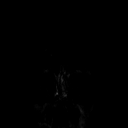

[Series 9: DWI · coronal · 5.0mm · 1.80mm/px · 2 of 34 slices shown (4 of 4)]
[im 1/34]
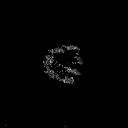
[im 34/34]
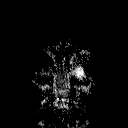

[Series 10: T2 · axial · 5.0mm · 0.51mm/px · z∈[-103,+39]mm · 2 of 22 slices shown (1 of 2)]
[im 1/22]
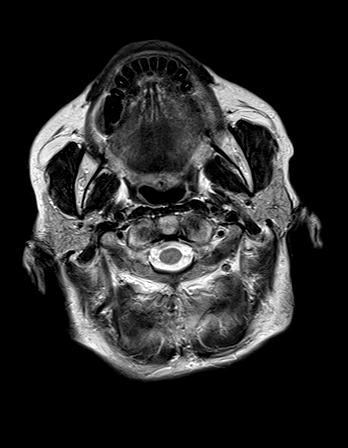
[im 22/22]
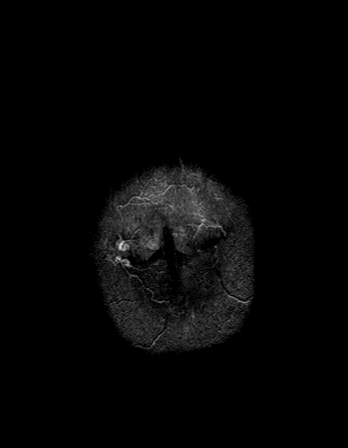

[Series 11: FLAIR · axial · 3.0mm · 0.45mm/px · z∈[-109,+47]mm · 2 of 27 slices shown]
[im 1/27]
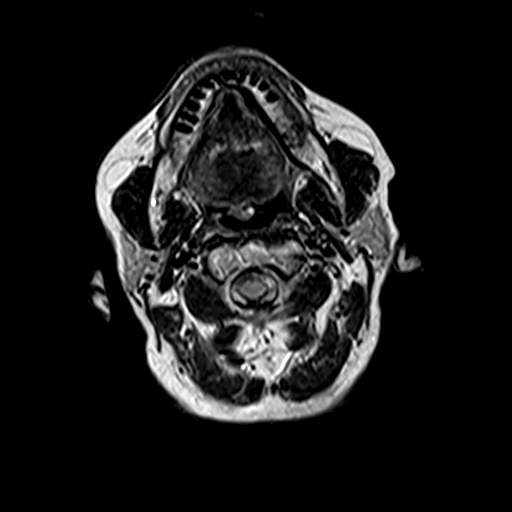
[im 27/27]
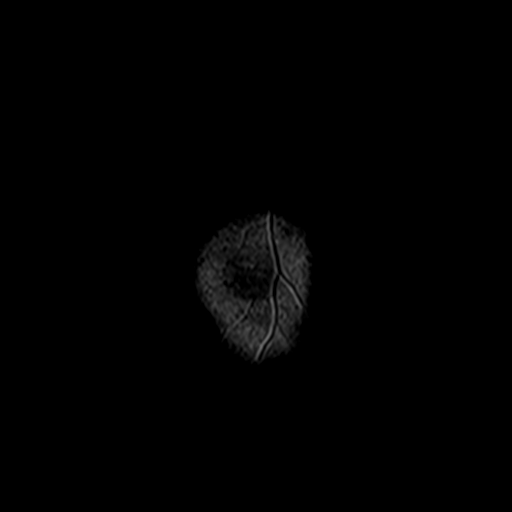

[Series 12: t1_mpr_tra copy center · axial · 1.0mm · 0.45mm/px · z∈[-112,+47]mm · 11 of 160 slices shown]
[im 1/160]
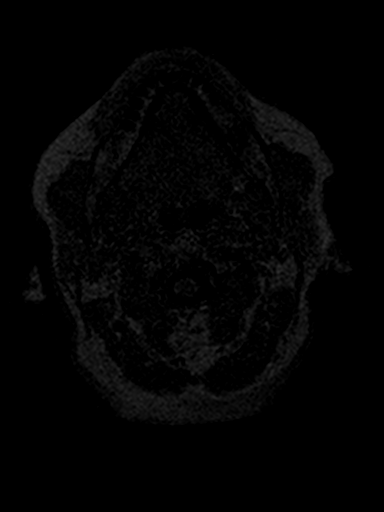
[im 15/160]
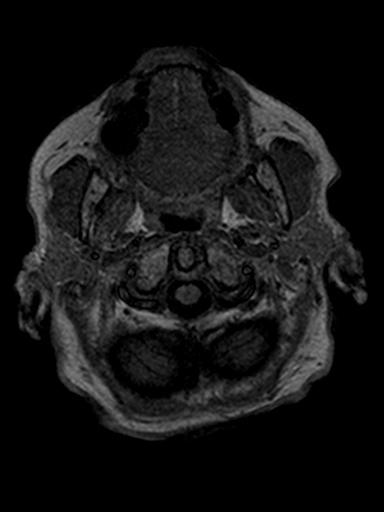
[im 29/160]
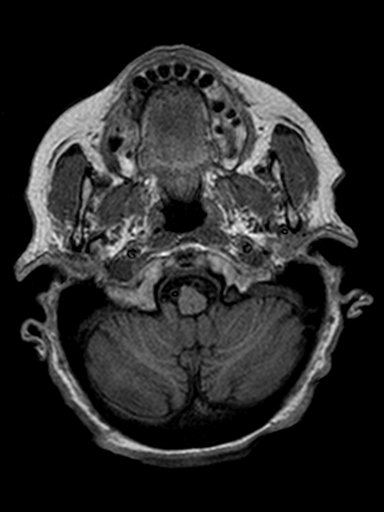
[im 44/160]
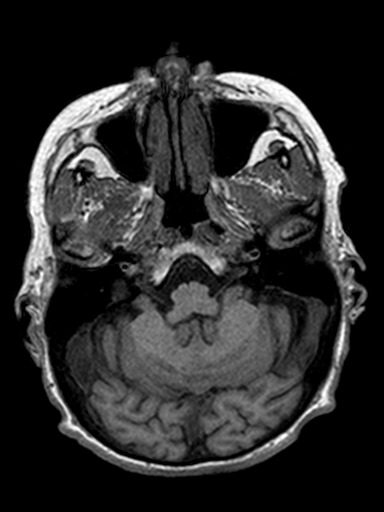
[im 58/160]
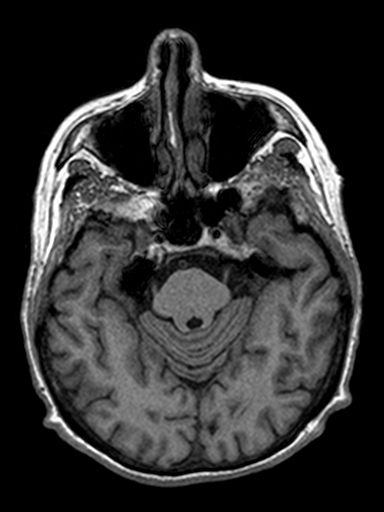
[im 73/160]
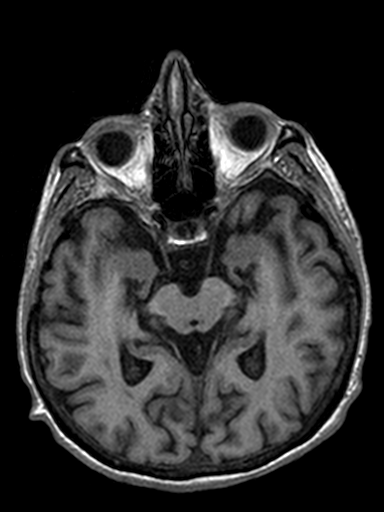
[im 87/160]
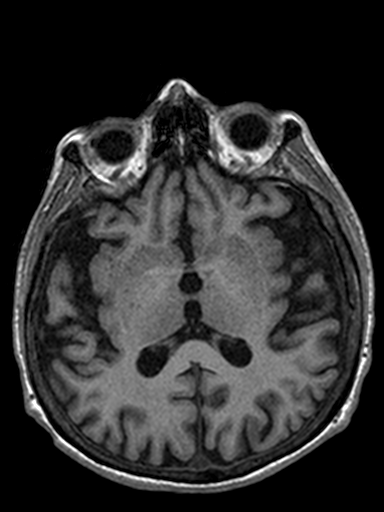
[im 102/160]
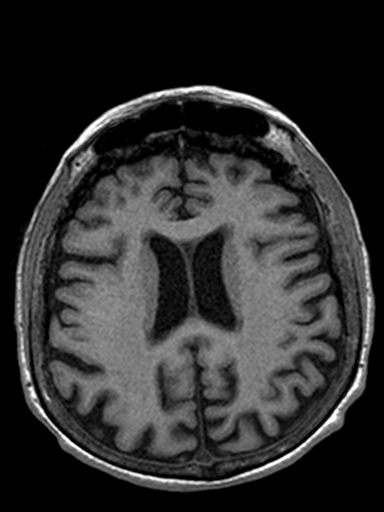
[im 116/160]
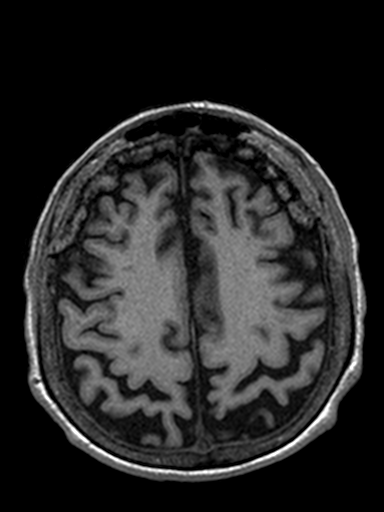
[im 131/160]
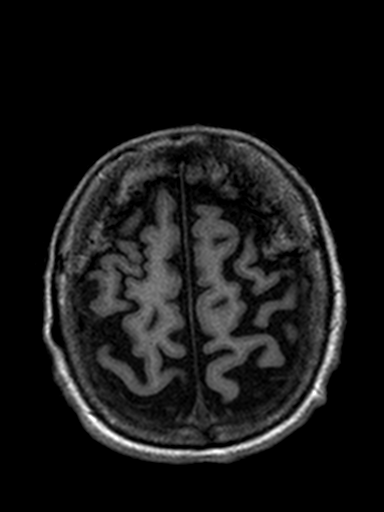
[im 160/160]
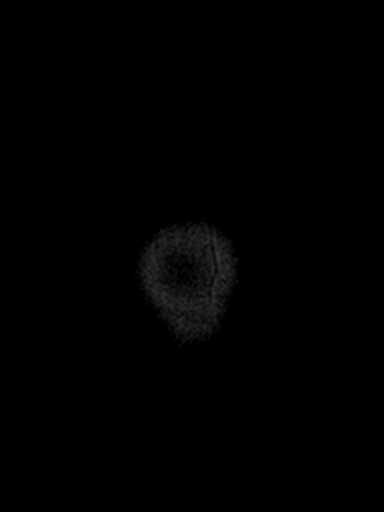

[Series 13: T2 · coronal · 5.0mm · 0.45mm/px · 2 of 26 slices shown (2 of 2)]
[im 1/26]
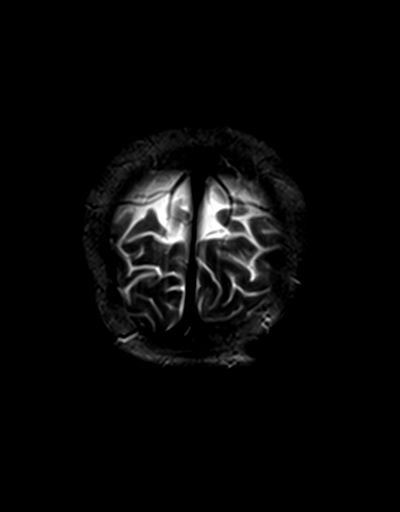
[im 26/26]
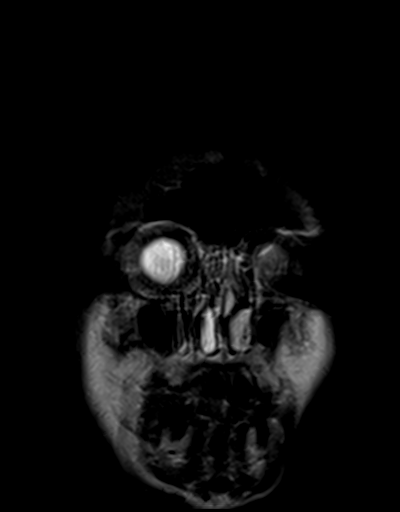

[47 of 48 positions shown; findings below may reference images not displayed]

FINDINGS: Brain: Cerebral volume is within normal limits for age. No
restricted diffusion to suggest acute infarction. No midline shift,
mass effect, evidence of mass lesion, ventriculomegaly, extra-axial
collection or acute intracranial hemorrhage. Cervicomedullary
junction and pituitary are within normal limits.

Mild for age scattered nonspecific cerebral white matter T2 and
FLAIR hyperintensity, most pronounced in the left periatrial white
matter. No associated cortical encephalomalacia or chronic cerebral
blood products. Mesial temporal lobe structures appear within normal
limits for age. The deep gray nuclei, brainstem and cerebellum
appear normal for age.

Vascular: Major intracranial vascular flow voids are preserved.

Skull and upper cervical spine: Normal visible cervical spine.
Normal bone marrow signal. Incidental skull hyperostosis again
noted.

Sinuses/Orbits: Postoperative changes to both globes, otherwise
negative orbits. Paranasal sinuses are clear.

Other: Mastoids are clear. Visible internal auditory structures
appear normal. Scalp and face soft tissues appear negative.
IMPRESSION: No acute intracranial abnormality and largely unremarkable for age
noncontrast MRI appearance of the brain.

## 2019-01-17 ENCOUNTER — Encounter (HOSPITAL_COMMUNITY): Payer: Self-pay

## 2019-01-17 ENCOUNTER — Emergency Department (HOSPITAL_COMMUNITY)
Admission: EM | Admit: 2019-01-17 | Discharge: 2019-01-18 | Disposition: A | Discharge status: Home / Self Care | Payer: PPO | Attending: Emergency Medicine | Admitting: Emergency Medicine

## 2019-01-17 ENCOUNTER — Other Ambulatory Visit: Payer: Self-pay

## 2019-01-17 DIAGNOSIS — E1165 Type 2 diabetes mellitus with hyperglycemia: Secondary | ICD-10-CM | POA: Insufficient documentation

## 2019-01-17 DIAGNOSIS — Z79899 Other long term (current) drug therapy: Secondary | ICD-10-CM | POA: Diagnosis not present

## 2019-01-17 DIAGNOSIS — Z20828 Contact with and (suspected) exposure to other viral communicable diseases: Secondary | ICD-10-CM | POA: Diagnosis not present

## 2019-01-17 DIAGNOSIS — F4321 Adjustment disorder with depressed mood: Secondary | ICD-10-CM | POA: Insufficient documentation

## 2019-01-17 DIAGNOSIS — F0280 Dementia in other diseases classified elsewhere without behavioral disturbance: Secondary | ICD-10-CM

## 2019-01-17 DIAGNOSIS — R45851 Suicidal ideations: Secondary | ICD-10-CM | POA: Insufficient documentation

## 2019-01-17 DIAGNOSIS — G301 Alzheimer's disease with late onset: Secondary | ICD-10-CM | POA: Diagnosis not present

## 2019-01-17 DIAGNOSIS — F0281 Dementia in other diseases classified elsewhere with behavioral disturbance: Secondary | ICD-10-CM | POA: Diagnosis not present

## 2019-01-17 DIAGNOSIS — R739 Hyperglycemia, unspecified: Secondary | ICD-10-CM

## 2019-01-17 DIAGNOSIS — F419 Anxiety disorder, unspecified: Secondary | ICD-10-CM | POA: Diagnosis present

## 2019-01-17 DIAGNOSIS — F4323 Adjustment disorder with mixed anxiety and depressed mood: Secondary | ICD-10-CM

## 2019-01-17 DIAGNOSIS — I10 Essential (primary) hypertension: Secondary | ICD-10-CM | POA: Diagnosis not present

## 2019-01-17 DIAGNOSIS — E871 Hypo-osmolality and hyponatremia: Secondary | ICD-10-CM | POA: Diagnosis not present

## 2019-01-17 DIAGNOSIS — R4589 Other symptoms and signs involving emotional state: Secondary | ICD-10-CM

## 2019-01-17 LAB — RAPID URINE DRUG SCREEN, HOSP PERFORMED
Amphetamines: NOT DETECTED
Barbiturates: NOT DETECTED
Benzodiazepines: NOT DETECTED
Cocaine: NOT DETECTED
Opiates: NOT DETECTED
Tetrahydrocannabinol: NOT DETECTED

## 2019-01-17 LAB — COMPREHENSIVE METABOLIC PANEL
ALT: 27 U/L (ref 0–44)
AST: 23 U/L (ref 15–41)
Albumin: 3.9 g/dL (ref 3.5–5.0)
Alkaline Phosphatase: 50 U/L (ref 38–126)
Anion gap: 11 (ref 5–15)
BUN: 26 mg/dL — ABNORMAL HIGH (ref 8–23)
CO2: 22 mmol/L (ref 22–32)
Calcium: 9 mg/dL (ref 8.9–10.3)
Chloride: 96 mmol/L — ABNORMAL LOW (ref 98–111)
Creatinine, Ser: 1.01 mg/dL — ABNORMAL HIGH (ref 0.44–1.00)
GFR calc Af Amer: >60 mL/min (ref >60)
GFR calc non Af Amer: 53 mL/min — ABNORMAL LOW (ref >60)
Glucose, Bld: 337 mg/dL — ABNORMAL HIGH (ref 70–99)
Potassium: 3.8 mmol/L (ref 3.5–5.1)
Sodium: 129 mmol/L — ABNORMAL LOW (ref 135–145)
Total Bilirubin: 1.5 mg/dL — ABNORMAL HIGH (ref 0.3–1.2)
Total Protein: 6.6 g/dL (ref 6.5–8.1)

## 2019-01-17 LAB — ETHANOL: Alcohol, Ethyl (B): <10 mg/dL (ref <10)

## 2019-01-17 LAB — CBC
HCT: 35.8 % — ABNORMAL LOW (ref 36.0–46.0)
Hemoglobin: 12.2 g/dL (ref 12.0–15.0)
MCH: 32.5 pg (ref 26.0–34.0)
MCHC: 34.1 g/dL (ref 30.0–36.0)
MCV: 95.5 fL (ref 80.0–100.0)
Platelets: 202 10*3/uL (ref 150–400)
RBC: 3.75 MIL/uL — ABNORMAL LOW (ref 3.87–5.11)
RDW: 15.8 % — ABNORMAL HIGH (ref 11.5–15.5)
WBC: 7.8 10*3/uL (ref 4.0–10.5)
nRBC: 0 % (ref 0.0–0.2)

## 2019-01-17 MED ORDER — LORAZEPAM 0.5 MG PO TABS
0.5000 mg | ORAL_TABLET | Freq: Once | ORAL | Status: AC
  Administered 2019-01-17: 0.5 mg via ORAL
  Filled 2019-01-17: qty 1

## 2019-01-17 NOTE — ED Triage Notes (Signed)
States everyday thinks of taking a gun and shooting herself states no history of this and states increased levels of anxiety. No pain voiced. States takes no medication for anxiety.

## 2019-01-17 NOTE — ED Provider Notes (Signed)
[place] DEPT Provider Note   CSN: [id number] Arrival date & time: 01/17/19  1056     History   Chief Complaint No chief complaint on file.   HPI Chandy Tarman is a 78 y.o. female.     Patient c/o feeling very anxious this AM, with thoughts of suicide or wishing someone else would kill her. Symptoms acute onset, moderate, persistent. Spouse indicates patient had been a big 'quilter' and recent trouble doing that, and that this AM she got rid of all her equipment/supplies. Denies other recent stressors. States pcp recently completed new onset dementia evaluations - no specific dx made. Denies hx suicidal ideation or depression. States normal appetite, no recent wt loss. No trouble sleeping at night. No recent medication change.   The history is provided by the patient.    Past Medical History:  Diagnosis Date   Diabetes mellitus without complication [facility])     Patient Active Problem List   Diagnosis Date Noted   Hypoglycemia 10/30/2015   Fall 10/30/2015   Colitis 04/24/2014   Essential hypertension 04/24/2014   Controlled type 2 diabetes mellitus with microalbuminuria or microproteinuria 04/24/2014   Hypotension 04/24/2014   High anion gap metabolic acidosis 49/44/9675   Acute kidney injury ([place]) 04/24/2014   Hyponatremia 04/24/2014   Normocytic anemia 04/24/2014   Infectious colitis 04/24/2014    Past Surgical History:  Procedure Laterality Date   HERNIA REPAIR     TONSILLECTOMY       OB History   No obstetric history on file.      Home Medications    Prior to Admission medications   Medication Sig Start Date End Date Taking? Authorizing Provider  alendronate (FOSAMAX) 70 MG tablet Take 70 mg by mouth once a week.  03/24/14   [provider]  Ascorbic Acid (VITAMIN C) 1000 MG tablet Take 1,000 mg by mouth daily.    [provider]  aspirin (GOODSENSE ASPIRIN) 81 MG chewable tablet Chew 81 mg by  mouth daily.    [provider]  beta carotene w/minerals (OCUVITE) tablet Take 1 tablet by mouth daily.    [provider]  Calcium Carbonate-Vitamin D 600-400 MG-UNIT tablet Take 1 tablet by mouth daily.    [provider]  Cholecalciferol (VITAMIN D3) 1000 units CAPS Take 1,000 Units by mouth daily.    [provider]  ibuprofen (ADVIL,MOTRIN) 800 MG tablet  09/26/17   [provider]  lisinopril (PRINIVIL,ZESTRIL) 20 MG tablet Take 20 mg by mouth daily. 10/03/15   [provider]  omeprazole (PRILOSEC) 40 MG capsule Take 40 mg by mouth 2 (two) times daily.     [provider]  oxybutynin (DITROPAN) 5 MG tablet Take 5 mg by mouth 2 (two) times daily. 10/19/15   [provider]  trimethoprim (TRIMPEX) 100 MG tablet Take 100 mg by mouth daily.    [provider]    Family History Family History  Problem Relation Age of Onset   Heart attack Father     Social History Social History   Tobacco Use   Smoking status: Never Smoker  Substance Use Topics   Alcohol use: No   Drug use: No     Allergies   Ciprofloxacin   Review of Systems Review of Systems  Constitutional: Negative for fever.  HENT: Negative for sore throat.   Eyes: Negative for visual disturbance.  Respiratory: Negative for shortness of breath.   Cardiovascular: Negative for chest pain.  Gastrointestinal: Negative for abdominal pain.  Genitourinary: Negative for flank pain.  Musculoskeletal: Negative for neck pain.  Skin: Negative for rash.  Neurological: Negative for headaches.  Hematological: Does not bruise/bleed easily.  Psychiatric/Behavioral: Positive for suicidal ideas. Negative for hallucinations. The patient is nervous/anxious.      Physical Exam Updated Vital Signs BP (!) 154/72 (BP Location: Right Arm)    Pulse 63    Temp 98.7 F (37.1 C) (Oral)    Resp 14    SpO2 99%   Physical Exam Vitals signs and nursing note  reviewed.  Constitutional:      Appearance: Normal appearance. She is well-developed.  HENT:     Head: Atraumatic.     Nose: Nose normal.     Mouth/Throat:     Mouth: Mucous membranes are moist.  Eyes:     General: No scleral icterus.    Conjunctiva/sclera: Conjunctivae normal.  Neck:     Musculoskeletal: Normal range of motion and neck supple. No neck rigidity or muscular tenderness.     Trachea: No tracheal deviation.     Comments: Thyroid not enlarged or tender.  Cardiovascular:     Rate and Rhythm: Normal rate and regular rhythm.     Pulses: Normal pulses.     Heart sounds: Normal heart sounds. No murmur. No friction rub. No gallop.   Pulmonary:     Effort: Pulmonary effort is normal. No respiratory distress.     Breath sounds: Normal breath sounds.  Abdominal:     General: Bowel sounds are normal. There is no distension.     Palpations: Abdomen is soft.     Tenderness: There is no abdominal tenderness. There is no guarding.  Genitourinary:    Comments: No cva tenderness.  Musculoskeletal:        General: No swelling.  Skin:    General: Skin is warm and dry.     Findings: No rash.  Neurological:     Mental Status: She is alert.     Comments: Alert, speech normal. Appears anxious. Motor/sens grossly intact bil, steady gait.   Psychiatric:     Comments: Anxious appearing. + SI this AM - no specific plan, no attempt to harm self.       ED Treatments / Results  Labs (all labs ordered are listed, but only abnormal results are displayed) Results for orders placed or performed during the hospital encounter of 01/17/19  CBC  Result Value Ref Range   WBC 7.8 4.0 - 10.5 K/uL   RBC 3.75 (L) 3.87 - 5.11 MIL/uL   Hemoglobin 12.2 12.0 - 15.0 g/dL   HCT 16.1 (L) 09.6 - 04.5 %   MCV 95.5 80.0 - 100.0 fL   MCH 32.5 26.0 - 34.0 pg   MCHC 34.1 30.0 - 36.0 g/dL   RDW 40.9 (H) 81.1 - 91.4 %   Platelets 202 150 - 400 K/uL   nRBC 0.0 0.0 - 0.2 %  Comprehensive metabolic panel    Result Value Ref Range   Sodium 129 (L) 135 - 145 mmol/L   Potassium 3.8 3.5 - 5.1 mmol/L   Chloride 96 (L) 98 - 111 mmol/L   CO2 22 22 - 32 mmol/L   Glucose, Bld 337 (H) 70 - 99 mg/dL   BUN 26 (H) 8 - 23 mg/dL   Creatinine, Ser 7.82 (H) 0.44 - 1.00 mg/dL   Calcium 9.0 8.9 - 95.6 mg/dL   Total Protein 6.6 6.5 - 8.1 g/dL   Albumin  3.9 3.5 - 5.0 g/dL   AST 23 15 - 41 U/L   ALT 27 0 - 44 U/L   Alkaline Phosphatase 50 38 - 126 U/L   Total Bilirubin 1.5 (H) 0.3 - 1.2 mg/dL   GFR calc non Af Amer 53 (L) >60 mL/min   GFR calc Af Amer >60 >60 mL/min   Anion gap 11 5 - 15  Ethanol  Result Value Ref Range   Alcohol, Ethyl (B) <10 <10 mg/dL  Rapid urine drug screen (hospital performed)  Result Value Ref Range   Opiates NONE DETECTED NONE DETECTED   Cocaine NONE DETECTED NONE DETECTED   Benzodiazepines NONE DETECTED NONE DETECTED   Amphetamines NONE DETECTED NONE DETECTED   Tetrahydrocannabinol NONE DETECTED NONE DETECTED   Barbiturates NONE DETECTED NONE DETECTED   Mr Brain Wo Contrast  Result Date: 01/17/2019 CLINICAL DATA:  78 year old female with memory loss for 6 months to 1 year. No known injury. EXAM: MRI HEAD WITHOUT CONTRAST TECHNIQUE: Multiplanar, multiecho pulse sequences of the brain and surrounding structures were obtained without intravenous contrast. COMPARISON:  Head CT 10/30/2015. FINDINGS: Brain: Cerebral volume is within normal limits for age. No restricted diffusion to suggest acute infarction. No midline shift, mass effect, evidence of mass lesion, ventriculomegaly, extra-axial collection or acute intracranial hemorrhage. Cervicomedullary junction and pituitary are within normal limits. Mild for age scattered nonspecific cerebral white matter T2 and FLAIR hyperintensity, most pronounced in the left periatrial white matter. No associated cortical encephalomalacia or chronic cerebral blood products. Mesial temporal lobe structures appear within normal limits for age. The deep  gray nuclei, brainstem and cerebellum appear normal for age. Vascular: Major intracranial vascular flow voids are preserved. Skull and upper cervical spine: Normal visible cervical spine. Normal bone marrow signal. Incidental skull hyperostosis again noted. Sinuses/Orbits: Postoperative changes to both globes, otherwise negative orbits. Paranasal sinuses are clear. Other: Mastoids are clear. Visible internal auditory structures appear normal. Scalp and face soft tissues appear negative. IMPRESSION: No acute intracranial abnormality and largely unremarkable for age noncontrast MRI appearance of the brain. Electronically Signed   By: Odessa Fleming M.D.   On: 01/17/2019 08:04    EKG None  Radiology Mr Brain Wo Contrast  Result Date: 01/17/2019 CLINICAL DATA:  78 year old female with memory loss for 6 months to 1 year. No known injury. EXAM: MRI HEAD WITHOUT CONTRAST TECHNIQUE: Multiplanar, multiecho pulse sequences of the brain and surrounding structures were obtained without intravenous contrast. COMPARISON:  Head CT 10/30/2015. FINDINGS: Brain: Cerebral volume is within normal limits for age. No restricted diffusion to suggest acute infarction. No midline shift, mass effect, evidence of mass lesion, ventriculomegaly, extra-axial collection or acute intracranial hemorrhage. Cervicomedullary junction and pituitary are within normal limits. Mild for age scattered nonspecific cerebral white matter T2 and FLAIR hyperintensity, most pronounced in the left periatrial white matter. No associated cortical encephalomalacia or chronic cerebral blood products. Mesial temporal lobe structures appear within normal limits for age. The deep gray nuclei, brainstem and cerebellum appear normal for age. Vascular: Major intracranial vascular flow voids are preserved. Skull and upper cervical spine: Normal visible cervical spine. Normal bone marrow signal. Incidental skull hyperostosis again noted. Sinuses/Orbits: Postoperative  changes to both globes, otherwise negative orbits. Paranasal sinuses are clear. Other: Mastoids are clear. Visible internal auditory structures appear normal. Scalp and face soft tissues appear negative. IMPRESSION: No acute intracranial abnormality and largely unremarkable for age noncontrast MRI appearance of the brain. Electronically Signed   By: Althea Grimmer.D.  On: 01/17/2019 08:04    Procedures Procedures (including critical care time)  Medications Ordered in ED Medications - No data to display   Initial Impression / Assessment and Plan / ED Course  I have reviewed the triage vital signs and the nursing notes.  Pertinent labs & imaging results that were available during my care of the patient were reviewed by me and considered in my medical decision making (see chart for details).  Labs sent.   Behavioral health team consulted for evaluation in ED.   Reviewed nursing notes and prior charts for additional history.  Recent mri negative for acute process.   Ativan .5 mg po.   Recheck pt, calm and alert. Await BH evaluation.  Labs reviewed, glucose elevated, hco3 normal.   [facility]     Final Clinical Impressions(s) / ED Diagnoses   Final diagnoses:  None    ED Discharge Orders    None       Cathren LaineSteinl, Samona Chihuahua, MD 01/17/19 1305

## 2019-01-17 NOTE — ED Notes (Signed)
No respiratory or acute distress noted alert and talking forgetful at times husband at bedside no pain voiced sitter watching pt.

## 2019-01-17 NOTE — BH Assessment (Addendum)
Tele Assessment Note   Patient Name: Victoria Tyler MRN: [id number] Referring Physician: Lajean Saver, MD Location of Patient: [facility] Location of Provider: [place] Department  Victoria Tyler is a 78 y.o. female who presented to [facility] on voluntary basis with complaint of suicidal ideation, despondency, and other symptoms.  Pt lives in [place] with her husband (present for the session), and she has grown children.  Pt is retired, and she is not followed by a psychiatrist.  This is Pt's first TTS assessment.  Pt reported as follows:  She is not sure why she is at the hospital.  She stated that she sometimes wishes she would die or that someone would shoot her.  She told hospital staff that if she had a gun, she would shoot herself.  Pt denied access to firearms.  Pt denied hallucination, homicidal ideation, substance use concerns, and self-injurious behavior.  Pt reported that she does not know what help she needs, if any.  Chief Strategy Officer spoke with [name], Victoria Tyler.  Husband reported that about a week ago, Pt began acting unusually -- she decided to give up sewing, her preferred hobby, after she found she could no longer operate the sewing machine.  Per husband, Pt has been making suicidal statements (for example,''I wish I I could shoot myself,'' and ''I wish someone would shoot me.'') for several months.  Also per husband, Pt has experienced panic attacks on a daily basis since she found she could not operate her sewing machine.  Pt had cognitive testing on 01/16/2019.  During assessment, Pt presented as alert and oriented to time, place, and name.  Pt's demeanor was calm.  She had good eye contact.  Pt's mood was empthy, and affect was constricted.  Pt's speech was normal in rate, rhythm, and volume.  Thought processes were within normal range, and thought content was logical and goal-oriented.  There was no evidence of delusion.  Pt's memory and concentration were fair.  Insight, judgment, and  impulse control were fair.  Consulted with Lindwood Qua, NP and M. Darleene Cleaver, MD, Pt meets inpt criteria -- geropsych.  Diagnosis: F4321; r/o dementia  Past Medical History:  Past Medical History:  Diagnosis Date  . Diabetes mellitus without complication [facility])     Past Surgical History:  Procedure Laterality Date  . HERNIA REPAIR    . TONSILLECTOMY      Family History:  Family History  Problem Relation Age of Onset  . Heart attack Father     Social History:  reports that she has never smoked. She has never used smokeless tobacco. She reports that she does not drink alcohol or use drugs.  Additional Social History:  Alcohol / Drug Use Pain Medications: See MAR Prescriptions: See MAR Over the Counter: See MAR History of alcohol / drug use?: No history of alcohol / drug abuse  CIWA: CIWA-Ar BP: (!) 154/72 Pulse Rate: 63 COWS:    Allergies:  Allergies  Allergen Reactions  . Ciprofloxacin     Hives. Patient reported it was itching at the IV site.    Home Medications: (Not in a hospital admission)   OB/GYN Status:  No LMP recorded. Patient is postmenopausal.  General Assessment Data Location of Assessment: [facility] ED TTS Assessment: In system Is this a Tele or Face-to-Face Assessment?: Tele Assessment Is this an Initial Assessment or a Re-assessment for this encounter?: Initial Assessment Patient Accompanied by:: Other(Husband) Language Other than English: No Living Arrangements: Other (Comment)(Single family home) What gender do you  identify as?: Female Marital status: Married Pregnancy Status: No Living Arrangements: Spouse/significant other Can pt return to current living arrangement?: Yes Admission Status: Voluntary Is patient capable of signing voluntary admission?: Yes Referral Source: Self/Family/Friend Insurance type: IT sales professionalHealthteam Advantage     Crisis Care Plan Living Arrangements: Spouse/significant other Name of Psychiatrist: None Name of Therapist:  None  Education Status Is patient currently in school?: No Is the patient employed, unemployed or receiving disability?: Unemployed(retired)  Risk to self with the past 6 months Suicidal Ideation: Yes-Currently Present(See notes -- Product managermkaing statements ) Has patient been a risk to self within the past 6 months prior to admission? : No Suicidal Intent: No Has patient had any suicidal intent within the past 6 months prior to admission? : No Is patient at risk for suicide?: No Suicidal Plan?: (See notes) Has patient had any suicidal plan within the past 6 months prior to admission? : Other (comment)(Unknown) Access to Means: No What has been your use of drugs/alcohol within the last 12 months?: Denied Previous Attempts/Gestures: No Intentional Self Injurious Behavior: None Family Suicide History: Unknown Recent stressful life event(s): Other (Comment)(See notes -- cannot operate sewing machine) Persecutory voices/beliefs?: No Depression: No Depression Symptoms: Despondent Substance abuse history and/or treatment for substance abuse?: No Suicide prevention information given to non-admitted patients: Not applicable  Risk to Others within the past 6 months Homicidal Ideation: No Does patient have any lifetime risk of violence toward others beyond the six months prior to admission? : No Thoughts of Harm to Others: No Current Homicidal Intent: No Current Homicidal Plan: No Access to Homicidal Means: No History of harm to others?: No Assessment of Violence: None Noted Does patient have access to weapons?: No Criminal Charges Pending?: No Does patient have a court date: No Is patient on probation?: No  Psychosis Hallucinations: None noted Delusions: None noted  Mental Status Report Appearance/Hygiene: In scrubs, Unremarkable Eye Contact: Good Motor Activity: Freedom of movement, Unremarkable Speech: Logical/coherent Level of Consciousness: Alert Mood: Empty Affect:  Constricted Anxiety Level: Panic Attacks Panic attack frequency: Daily Most recent panic attack: 01/17/2019 Thought Processes: Relevant, Coherent Judgement: Partial Orientation: Person, Place, Time Obsessive Compulsive Thoughts/Behaviors: None  Cognitive Functioning Concentration: Good Memory: Recent Intact, Remote Intact Is patient IDD: No Insight: Fair Impulse Control: Good Appetite: Good Have you had any weight changes? : No Change Sleep: No Change Total Hours of Sleep: 7 Vegetative Symptoms: None  ADLScreening [facility] Assessment Services) Patient's cognitive ability adequate to safely complete daily activities?: Yes Patient able to express need for assistance with ADLs?: Yes Independently performs ADLs?: Yes (appropriate for developmental age)  Prior Inpatient Therapy Prior Inpatient Therapy: No  Prior Outpatient Therapy Prior Outpatient Therapy: No Does patient have an ACCT team?: No Does patient have Intensive In-House Services?  : No Does patient have [facility] services? : No Does patient have P4CC services?: No  ADL Screening (condition at time of admission) Patient's cognitive ability adequate to safely complete daily activities?: Yes Is the patient deaf or have difficulty hearing?: No Does the patient have difficulty seeing, even when wearing glasses/contacts?: No Does the patient have difficulty concentrating, remembering, or making decisions?: No Patient able to express need for assistance with ADLs?: Yes Does the patient have difficulty dressing or bathing?: No Independently performs ADLs?: Yes (appropriate for developmental age) Does the patient have difficulty walking or climbing stairs?: No Weakness of Legs: None Weakness of Arms/Hands: None  Home Assistive Devices/Equipment Home Assistive Devices/Equipment: None  Therapy Consults (therapy consults require  a physician order) PT Evaluation Needed: No OT Evalulation Needed: No SLP Evaluation Needed:  No Abuse/Neglect Assessment (Assessment to be complete while patient is alone) Abuse/Neglect Assessment Can Be Completed: Yes Physical Abuse: Denies Verbal Abuse: Denies Sexual Abuse: Denies Exploitation of patient/patient's resources: Denies Self-Neglect: Denies Values / Beliefs Cultural Requests During Hospitalization: None Spiritual Requests During Hospitalization: None Consults Spiritual Care Consult Needed: No Social Work Consult Needed: No Merchant navy officer (For Healthcare) Does Patient Have a Medical Advance Directive?: No          Disposition:  Disposition Initial Assessment Completed for this Encounter: Yes Disposition of Patient: Admit Type of inpatient treatment program: (Per M. Jannifer Franklin, MD, Pt meets inpt criteria -- gero)  This service was provided via telemedicine using a 2-way, interactive audio and video technology.  Names of all persons participating in this telemedicine service and their role in this encounter. Name: Zurii, Hewes Role: Pt             Dorris Fetch Myrick Mcnairy 01/17/2019 1:19 PM

## 2019-01-17 NOTE — ED Notes (Signed)
Pt has been seen wand by security.  Pt has one bag of belongings. 

## 2019-01-18 ENCOUNTER — Encounter (HOSPITAL_COMMUNITY): Payer: Self-pay | Admitting: Registered Nurse

## 2019-01-18 LAB — BASIC METABOLIC PANEL
Anion gap: 16 — ABNORMAL HIGH (ref 5–15)
BUN: 19 mg/dL (ref 8–23)
CO2: 19 mmol/L — ABNORMAL LOW (ref 22–32)
Calcium: 9.4 mg/dL (ref 8.9–10.3)
Chloride: 100 mmol/L (ref 98–111)
Creatinine, Ser: 0.78 mg/dL (ref 0.44–1.00)
GFR calc Af Amer: >60 mL/min (ref >60)
GFR calc non Af Amer: >60 mL/min (ref >60)
Glucose, Bld: 186 mg/dL — ABNORMAL HIGH (ref 70–99)
Potassium: 3.6 mmol/L (ref 3.5–5.1)
Sodium: 135 mmol/L (ref 135–145)

## 2019-01-18 LAB — TSH: TSH: 3.047 u[IU]/mL (ref 0.350–4.500)

## 2019-01-18 LAB — SARS CORONAVIRUS 2 BY RT PCR (HOSPITAL ORDER, PERFORMED IN ~~LOC~~ HOSPITAL LAB): SARS Coronavirus 2: NEGATIVE

## 2019-01-18 MED ORDER — ENOXAPARIN SODIUM 40 MG/0.4ML ~~LOC~~ SOLN
40.0000 mg | SUBCUTANEOUS | Status: DC
  Administered 2019-01-18: 13:00:00 40 mg via SUBCUTANEOUS
  Filled 2019-01-18: qty 0.4

## 2019-01-18 MED ORDER — HYDROXYZINE HCL 25 MG PO TABS: 25.0000 mg | ORAL_TABLET | Freq: Two times a day (BID) | ORAL | 0 refills | Status: DC | PRN

## 2019-01-18 MED ORDER — LORAZEPAM 0.5 MG PO TABS
0.5000 mg | ORAL_TABLET | Freq: Once | ORAL | Status: AC
  Administered 2019-01-18: 11:00:00 0.5 mg via ORAL
  Filled 2019-01-18: qty 1

## 2019-01-18 NOTE — ED Provider Notes (Addendum)
Emergency Medicine Observation Re-evaluation Note  Victoria Tyler is a 78 y.o. female, seen on rounds today.  Pt initially presented to the ED for complaints of Suicidal Currently, the patient is very anxious and hyperventilating.  Patient's tech reports that she was fine this morning.  She had eaten breakfast and walk to the bathroom and back without difficulty.  The tech reports that they had just told her that her husband would be there at 10 AM.  The patient abruptly became very upset and started hyperventilating.  She is now telling me that she has to get to the [facility].  She is perseverating on this.  She cannot tell me why she has to get there.  She is extremely anxious in appearance and hyperventilating.  Patient is able to recite her home address and phone line without difficulty.  She cannot tell me why she had this sudden change.  Asked that she feels short of breath she says yes.  She denies she is having chest pain.  She reports both of her legs hurt.  Physical Exam  BP (!) 137/59 (BP Location: Left Arm)   Pulse 95   Temp 98.5 F (36.9 C) (Oral)   Resp 15   Ht 5\' 2"  (1.575 m)   Wt 60.8 kg   SpO2 100%   BMI 24.51 kg/m  Physical Exam Constitutional: Patient is alert and hyperventilating.  She is very anxious in appearance.  Her color is good.  Skin is warm and dry. H EENT.  Extraocular movements intact and normal.  Airway is widely patent.  Mucous membranes moist. Heart: Regular, no rub murmur gallop.  Distal pulses 2+ and symmetric. Lungs: Bilaterally clear to auscultation.  Excellent airflow throughout.  Patient is hyperventilating. Abdomen: Soft nondistended nontender. Extremities: Bilateral lower extremities have 1+ pitting edema symmetric.  The calves are very soft and pliable.  Patient is endorsing tenderness throughout the entirety of both lower extremities from the knees to the feet.  No skin changes. Neurologic: Patient is anxious and perseverating.   Her speech quality is clear.  Her movements are coordinated and purposeful.  Her strength is good.  I can have her sitting upright in the stretcher and she is holding both of my hands and pulling them back and forth with good strength. Skin: Warm and dry. ED Course / MDM  EKG:    I have reviewed the labs performed to date as well as medications administered while in observation.   Plan  Patient is very abruptly had a change in her demeanor.  Her sitter reports that this happened right after she was told that her husband would not be here till 10 AM.  Her sitter reports that the patient said," this is not going to be good" and then began hyperventilating and calling out repeatedly.  Patient's oxygen saturation is 100%.  Her pulmonary auscultation is normal.  She has mild symmetric edema at the ankles.  I do not get the impression that she is volume overloaded.  This did seem to come very quickly after receiving some distressing information.  By appearances, she appears to be having a panic attack.  Will give 0.5 mg of Ativan.  I had planned to recheck some lab work to follow-up on mild hyponatremia and hyperglycemia from earlier studies.  We will follow-up on additional lab work and reassess the patient for response once she has calmed down.  At this time do not have high clinical suspicion for DVT or PE  based on circumstances of the event and otherwise normal vital signs.  Will recheck for patient's vital signs and complaints once this immediate episode has improved.   Arby Barrette, MD 01/18/19 [phone]  Follow-up on ordered labs are within normal limits.  Patient's sodium has normalized and blood sugars are improved.  I have reassessed the patient.  She reports her symptoms got much better just after she got the Ativan.  She no longer feels short of breath.  She reports she is ready to go home now.  The patient's husband is at bedside.  They report they have spoken with the psychiatric team this morning  and will likely be going home.  I am just awaiting final disposition per behavioral health.   Arby Barrette, MD 01/18/19 1355

## 2019-01-18 NOTE — Consult Note (Addendum)
[facility] Psych ED Discharge  This service was provided via telemedicine using a 2-way, interactive audio and video technology.   Names of all persons participating in this telemedicine service and their role in this encounter. Name: Dr. Toy Care Role: Psychiatrist  Name: Victoria Tyler    Role: Patient  Name: Victoria Tyler Role: TTS  Name: [name], NP Role: NP/ co-provider     01/18/2019 2:30 PM Victoria Tyler  MRN:  [id number] Principal Problem: anxiety, suicidal ideations Discharge Diagnoses: Adjustment disorder with mixed anxiety and depressed mood Late onset Alzheimer's dementia  Subjective: 78 y/o female who was brought by her spouse for suicidal ideations and anxiety. Pt was recently diagnosed with dementia by her PCP. As per collateral information from husband, pt was doing fine until a few months ago when she started to act unusually. She used to enjoy sewing, however, she gave up on that. She began making suicidal statements like ''I wish someone would shoot me.'' a few months ago. She also started experience panic attacks on a daily basis. She underwent cognitive testing on 01/16/2019 and was diagnosed with dementia at that time.  Pt had a few episodes of anxiety and panic attacks in the ER since her presentation. She was given PRN dose of Ativan which helped her anxiety. Upon assessment, pt was noted to be calm and cooperative. She made good eye contact. She acknowledged having memory issues for past few months. And this was followed by the fact that her sewing machine had stopped working as well as it used to. She did not want to spend $3000 to purchase a new one and therefore had to give up her habit of sewing and quilting. This led to her feeling sad and anxious at times. She acknowledged making some suicidal statements, however, she denied having any suicidal intent or plan.  She reported feeling sad at times with some changes in her sleep and appetite, however, reported that overall she is able  to sleep well at night. She denied any prior suicide attempts or non-suicidal self injurious behaviors. She denied any symptoms suggestive of mania, hypomania, psychosis. She denied any current suicidal ideations or plans.  TTS Victoria Tyler spoke with her husband again for collateral information and husband expressed no concerns about her safety and is willing to bring her home.   Total Time spent with patient: 1 hour  Past Psychiatric History: new onset dementia, diagnosed by her PCP a few weeks ago.  Past Medical History:  Past Medical History:  Diagnosis Date  . Diabetes mellitus without complication [facility])     Past Surgical History:  Procedure Laterality Date  . HERNIA REPAIR    . TONSILLECTOMY     Family History:  Family History  Problem Relation Age of Onset  . Heart attack Father    Family Psychiatric  History: denied  Social History:  Social History   Substance and Sexual Activity  Alcohol Use No     Social History   Substance and Sexual Activity  Drug Use No    Social History   Socioeconomic History  . Marital status: Married    Spouse name: Not on file  . Number of children: Not on file  . Years of education: Not on file  . Highest education level: Not on file  Occupational History  . 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 Financial resource strain: Not on file  . Food insecurity    Worry: Not on file    Inability: Not on file  .  Transportation needs    Medical: Not on file    Non-medical: Not on file  Tobacco Use  . Smoking status: Never Smoker  . Smokeless tobacco: Never Used  Substance and Sexual Activity  . Alcohol use: No  . Drug use: No  . Sexual activity: Yes  Lifestyle  . Physical activity    Days per week: Not on file    Minutes per session: Not on file  . Stress: Not on file  Relationships  . Social Musicianconnections    Talks on phone: Not on file    Gets together: Not on file    Attends religious service: Not on file    Active member of  club or organization: Not on file    Attends meetings of clubs or organizations: Not on file    Relationship status: Not on file  Other Topics Concern  . Not on file  Social History Narrative   [place] in [place] her [place].  She has grown children.  She is retired.  She does not have a psychiatrist.    Has this patient used any form of tobacco in the last 30 days? (Cigarettes, Smokeless Tobacco, Cigars, and/or Pipes) Prescription not provided because: denied  Current Medications: Current Facility-Administered Medications  Medication Dose Route Frequency Provider Last Rate Last Dose  . enoxaparin (LOVENOX) injection 40 mg  40 mg Subcutaneous Q24H Arby BarrettePfeiffer, Marcy, MD   40 mg at 01/18/19 1306   Current Outpatient Medications  Medication Sig Dispense Refill  . alendronate (FOSAMAX) 70 MG tablet Take 70 mg by mouth once a week.     . Ascorbic Acid (VITAMIN C) 1000 MG tablet Take 1,000 mg by mouth daily.    . beta carotene w/minerals (OCUVITE) tablet Take 1 tablet by mouth daily.    . Calcium Carbonate-Vitamin D 600-400 MG-UNIT tablet Take 1 tablet by mouth daily.    . Cholecalciferol (VITAMIN D3) 1000 units CAPS Take 1,000 Units by mouth daily.    . [employer] ARTHRITIS PAIN RELIEF 650 MG CR tablet Take 650 mg by mouth every 8 (eight) hours as needed for pain.     Marland Kitchen. doxycycline (VIBRAMYCIN) 100 MG capsule Take 100 mg by mouth daily.     Marland Kitchen. lactulose (CHRONULAC) 10 GM/15ML solution Take 10 g by mouth daily as needed for moderate constipation.     Marland Kitchen. lisinopril (PRINIVIL,ZESTRIL) 20 MG tablet Take 20 mg by mouth daily.  0  . meloxicam (MOBIC) 7.5 MG tablet Take 7.5 mg by mouth daily.    Marland Kitchen. omeprazole (PRILOSEC) 40 MG capsule Take 40 mg by mouth 2 (two) times daily.     Marland Kitchen. oxybutynin (DITROPAN) 5 MG tablet Take 5 mg by mouth 2 (two) times daily.  3  . trimethoprim (TRIMPEX) 100 MG tablet Take 100 mg by mouth daily.    Marland Kitchen. aspirin (GOODSENSE ASPIRIN) 81 MG chewable tablet Chew 81 mg by mouth  daily.    . hydrOXYzine (ATARAX/VISTARIL) 25 MG tablet Take 1 tablet (25 mg total) by mouth 2 (two) times daily as needed. 30 tablet 0   PTA Medications: (Not in a hospital admission)   Musculoskeletal: Strength & Muscle Tone: within normal limits Gait & Station: normal Patient leans: N/A  Psychiatric Specialty Exam: Physical Exam  Constitutional: She is oriented to person, place, and time. She appears well-developed and well-nourished.  HENT:  Head: Normocephalic and atraumatic.  Musculoskeletal: Normal range of motion.  Neurological: She is alert and oriented to person, place, and time.  Review of Systems  Constitutional: Negative.   HENT: Negative.   Respiratory: Negative.   Cardiovascular: Negative.   Gastrointestinal: Negative.   Musculoskeletal: Negative.     Blood pressure (!) 147/59, pulse 75, temperature 98.2 F (36.8 C), temperature source Oral, resp. rate 18, height 5\' 2"  (1.575 m), weight 60.8 kg, SpO2 99 %.Body mass index is 24.51 kg/m.  General Appearance: Fairly Groomed  Eye Contact:  Good  Speech:  Normal Rate  Volume:  Normal  Mood:  Euthymic  Affect:  Appropriate  Thought Process:  Goal Directed and Descriptions of Associations: Intact  Orientation:  Full (Time, Place, and Person)  Thought Content:  Logical  Suicidal Thoughts:  No  Homicidal Thoughts:  No  Memory:  Immediate;   Good Recent;   Fair  Judgement:  Fair  Insight:  Fair  Psychomotor Activity:  Decreased  Concentration:  Concentration: Good  Recall:  Good  Fund of Knowledge:  Good  Language:  Good  Akathisia:  No  Handed:  Right  AIMS (if indicated):     Assets:   ADL's:  Intact  Cognition:  Impaired,  Mild  Sleep:   good     Demographic Factors:  Age 40 or older  Loss Factors: Decline in physical health  Historical Factors: inability to pursue her hobbies  Risk Reduction  Factors:   Sense of responsibility to family, Living with another person, especially a relative and Positive social support  Continued Clinical Symptoms:  Panic Attacks  Cognitive Features That Contribute To Risk:  Mild cognitive decline  Suicide Risk:  Minimal: No identifiable suicidal ideation.  Patients presenting with no risk factors but with morbid ruminations; may be classified as minimal risk based on the severity of the depressive symptoms    Plan Of Care/Follow-up recommendations:  78 y/o female with newly diagnosed dementia who was brought in for escalating anxiety and passive suicidal ideations in the context of not being able to enjoy her hobbies due to several reasons. She was noted to be quite anxious in the ED and responded well to Lorazepam, however, due to her elderly age would not recommend long term use of benzodiazepines. She is no longer having suicidal ideations. She will benefit from following up with Neurology for dementia. Will discharge on a prescription for Hydroxyzine 25 mg BID PRN for anxiety.  Disposition:  Pt does not meet criteria for in-patient psychiatric admission. She is cleared for discharge from psychiatry standpoint. Her husband is willing to bring her home. She is scheduled to see her PCP on January 21, 2019.   January 23, 2019, MD 01/18/2019, 2:30 PM

## 2019-01-18 NOTE — Progress Notes (Signed)
01/18/2019  Order for CBC and Creatinine level are for baseline. Labs were drawn yesterday 01/17/2019 PLT 202 Creatinine level 0.78

## 2019-01-18 NOTE — Progress Notes (Signed)
01/18/2019  0915  Labs drawn. Light green and gold tubes sent to main lab.

## 2019-01-18 NOTE — BH Assessment (Signed)
[facility] Assessment Progress Note This Probation officer spoke with patient's husband who was at bedside with patient (husband Victoria Tyler [phone]) who reported he felt that wife was safe to return home this date. Patient's husband reports that patient has no previous history associated with self harm and states "that is just something she would not do." Patient contacts for safety and reports she feels safe to return home.

## 2019-01-21 DIAGNOSIS — F419 Anxiety disorder, unspecified: Secondary | ICD-10-CM | POA: Diagnosis not present

## 2019-01-21 DIAGNOSIS — Z09 Encounter for follow-up examination after completed treatment for conditions other than malignant neoplasm: Secondary | ICD-10-CM | POA: Diagnosis not present

## 2019-01-21 DIAGNOSIS — M353 Polymyalgia rheumatica: Secondary | ICD-10-CM | POA: Diagnosis not present

## 2019-01-21 DIAGNOSIS — R4189 Other symptoms and signs involving cognitive functions and awareness: Secondary | ICD-10-CM | POA: Diagnosis not present

## 2019-01-21 DIAGNOSIS — Z8639 Personal history of other endocrine, nutritional and metabolic disease: Secondary | ICD-10-CM | POA: Diagnosis not present

## 2019-01-21 DIAGNOSIS — D649 Anemia, unspecified: Secondary | ICD-10-CM | POA: Diagnosis not present

## 2019-01-23 ENCOUNTER — Encounter (HOSPITAL_COMMUNITY): Payer: Self-pay

## 2019-01-23 ENCOUNTER — Emergency Department (HOSPITAL_COMMUNITY)
Admission: EM | Admit: 2019-01-23 | Discharge: 2019-01-23 | Disposition: A | Discharge status: Home / Self Care | Payer: PPO | Attending: Emergency Medicine | Admitting: Emergency Medicine

## 2019-01-23 ENCOUNTER — Other Ambulatory Visit: Payer: Self-pay

## 2019-01-23 DIAGNOSIS — Z79899 Other long term (current) drug therapy: Secondary | ICD-10-CM | POA: Insufficient documentation

## 2019-01-23 DIAGNOSIS — I10 Essential (primary) hypertension: Secondary | ICD-10-CM | POA: Insufficient documentation

## 2019-01-23 DIAGNOSIS — E119 Type 2 diabetes mellitus without complications: Secondary | ICD-10-CM | POA: Diagnosis not present

## 2019-01-23 DIAGNOSIS — R197 Diarrhea, unspecified: Secondary | ICD-10-CM | POA: Insufficient documentation

## 2019-01-23 DIAGNOSIS — Z7982 Long term (current) use of aspirin: Secondary | ICD-10-CM | POA: Diagnosis not present

## 2019-01-23 DIAGNOSIS — K5900 Constipation, unspecified: Secondary | ICD-10-CM | POA: Diagnosis not present

## 2019-01-23 LAB — COMPREHENSIVE METABOLIC PANEL
ALT: 23 U/L (ref 0–44)
AST: 22 U/L (ref 15–41)
Albumin: 3.3 g/dL — ABNORMAL LOW (ref 3.5–5.0)
Alkaline Phosphatase: 60 U/L (ref 38–126)
Anion gap: 11 (ref 5–15)
BUN: 22 mg/dL (ref 8–23)
CO2: 22 mmol/L (ref 22–32)
Calcium: 8.7 mg/dL — ABNORMAL LOW (ref 8.9–10.3)
Chloride: 96 mmol/L — ABNORMAL LOW (ref 98–111)
Creatinine, Ser: 0.92 mg/dL (ref 0.44–1.00)
GFR calc Af Amer: >60 mL/min (ref >60)
GFR calc non Af Amer: 60 mL/min — ABNORMAL LOW (ref >60)
Glucose, Bld: 318 mg/dL — ABNORMAL HIGH (ref 70–99)
Potassium: 4.4 mmol/L (ref 3.5–5.1)
Sodium: 129 mmol/L — ABNORMAL LOW (ref 135–145)
Total Bilirubin: 1.2 mg/dL (ref 0.3–1.2)
Total Protein: 5.4 g/dL — ABNORMAL LOW (ref 6.5–8.1)

## 2019-01-23 LAB — CBC WITH DIFFERENTIAL/PLATELET
Abs Immature Granulocytes: 0.13 10*3/uL — ABNORMAL HIGH (ref 0.00–0.07)
Basophils Absolute: 0 10*3/uL (ref 0.0–0.1)
Basophils Relative: 0 %
Eosinophils Absolute: 0 10*3/uL (ref 0.0–0.5)
Eosinophils Relative: 0 %
HCT: 33.9 % — ABNORMAL LOW (ref 36.0–46.0)
Hemoglobin: 11.4 g/dL — ABNORMAL LOW (ref 12.0–15.0)
Immature Granulocytes: 1 %
Lymphocytes Relative: 8 %
Lymphs Abs: 0.8 10*3/uL (ref 0.7–4.0)
MCH: 33.1 pg (ref 26.0–34.0)
MCHC: 33.6 g/dL (ref 30.0–36.0)
MCV: 98.5 fL (ref 80.0–100.0)
Monocytes Absolute: 0.8 10*3/uL (ref 0.1–1.0)
Monocytes Relative: 8 %
Neutro Abs: 7.6 10*3/uL (ref 1.7–7.7)
Neutrophils Relative %: 83 %
Platelets: 198 10*3/uL (ref 150–400)
RBC: 3.44 MIL/uL — ABNORMAL LOW (ref 3.87–5.11)
RDW: 15.9 % — ABNORMAL HIGH (ref 11.5–15.5)
WBC: 9.3 10*3/uL (ref 4.0–10.5)
nRBC: 0 % (ref 0.0–0.2)

## 2019-01-23 LAB — LIPASE, BLOOD: Lipase: 33 U/L (ref 11–51)

## 2019-01-23 NOTE — ED Provider Notes (Signed)
[facility]-EMERGENCY DEPT Provider Note   CSN: [id number] Arrival date & time: 01/23/19  0904     History   Chief Complaint Chief Complaint  Patient presents with  . Constipation    HPI Victoria Tyler is a 78 y.o. female.     HPI   78 year old female, with a PMH of diabetes, Alzheimer's, presents with complaints of constipation and diarrhea.  Patient states that for weeks to months she has had intermittent constipation and diarrhea.  She states that she is followed with her primary care provider for this and been given a medication that she takes several tablespoons in the morning for.  She states this morning she had a large bowel movement which was loose and watery.  She also had a bowel movement yesterday but states it was normal.  She denies any hematochezia, melena.  She denies any associated abdominal pain, fevers, chills, nausea, vomiting.  When asked what change prompted patient to come in today she denies any new or worsening symptoms.  She states "I just felt like I probably should not live like this."  Past Medical History:  Diagnosis Date  . Diabetes mellitus without complication [facility])     Patient Active Problem List   Diagnosis Date Noted  . Hypoglycemia 10/30/2015  . Fall 10/30/2015  . Colitis 04/24/2014  . Essential hypertension 04/24/2014  . Controlled type 2 diabetes mellitus with microalbuminuria or microproteinuria 04/24/2014  . Hypotension 04/24/2014  . High anion gap metabolic acidosis 04/24/2014  . Acute kidney injury (HCC) 04/24/2014  . Hyponatremia 04/24/2014  . Normocytic anemia 04/24/2014  . Infectious colitis 04/24/2014    Past Surgical History:  Procedure Laterality Date  . HERNIA REPAIR    . TONSILLECTOMY       OB History   No obstetric history on file.      Home Medications    Prior to Admission medications   Medication Sig Start Date End Date Taking? Authorizing Provider  alendronate (FOSAMAX) 70 MG tablet  Take 70 mg by mouth once a week.  03/24/14   [provider]  Ascorbic Acid (VITAMIN C) 1000 MG tablet Take 1,000 mg by mouth daily.    [provider]  aspirin (GOODSENSE ASPIRIN) 81 MG chewable tablet Chew 81 mg by mouth daily.    [provider]  beta carotene w/minerals (OCUVITE) tablet Take 1 tablet by mouth daily.    [provider]  Calcium Carbonate-Vitamin D 600-400 MG-UNIT tablet Take 1 tablet by mouth daily.    [provider]  Cholecalciferol (VITAMIN D3) 1000 units CAPS Take 1,000 Units by mouth daily.    [provider]  [employer]S ARTHRITIS PAIN RELIEF 650 MG CR tablet Take 650 mg by mouth every 8 (eight) hours as needed for pain.  12/16/18   [provider]  doxycycline (VIBRAMYCIN) 100 MG capsule Take 100 mg by mouth daily.  11/03/18   [provider]  hydrOXYzine (ATARAX/VISTARIL) 25 MG tablet Take 1 tablet (25 mg total) by mouth 2 (two) times daily as needed. 01/18/19   Zena Amos, MD  lactulose (CHRONULAC) 10 GM/15ML solution Take 10 g by mouth daily as needed for moderate constipation.  01/12/19   [provider]  lisinopril (PRINIVIL,ZESTRIL) 20 MG tablet Take 20 mg by mouth daily. 10/03/15   [provider]  meloxicam (MOBIC) 7.5 MG tablet Take 7.5 mg by mouth daily. 01/10/19   [provider]  omeprazole (PRILOSEC) 40 MG capsule Take 40 mg by  mouth 2 (two) times daily.     [provider]  oxybutynin (DITROPAN) 5 MG tablet Take 5 mg by mouth 2 (two) times daily. 10/19/15   [provider]  trimethoprim (TRIMPEX) 100 MG tablet Take 100 mg by mouth daily.    [provider]    Family History Family History  Problem Relation Age of Onset  . Heart attack Father     Social History Social History   Tobacco Use  . Smoking status: Never Smoker  . Smokeless tobacco: Never Used  Substance Use Topics  . Alcohol use: No  . Drug use: No     Allergies    Ciprofloxacin   Review of Systems Review of Systems  Constitutional: Negative for chills and fever.  Respiratory: Negative for shortness of breath.   Cardiovascular: Negative for chest pain.  Gastrointestinal: Positive for constipation and diarrhea. Negative for abdominal pain, nausea and vomiting.  Genitourinary: Negative for dysuria.     Physical Exam Updated Vital Signs BP (!) 147/72   Pulse 74   Temp 97.9 F (36.6 C) (Oral)   Resp 16   Ht 5\' 2"  (1.575 m)   Wt 60.8 kg   SpO2 98%   BMI 24.51 kg/m   Physical Exam Vitals signs and nursing note reviewed.  Constitutional:      Appearance: She is well-developed.  HENT:     Head: Normocephalic and atraumatic.  Eyes:     Conjunctiva/sclera: Conjunctivae normal.  Neck:     Musculoskeletal: Neck supple.  Cardiovascular:     Rate and Rhythm: Normal rate and regular rhythm.     Heart sounds: Normal heart sounds. No murmur.  Pulmonary:     Effort: Pulmonary effort is normal. No respiratory distress.     Breath sounds: Normal breath sounds. No wheezing or rales.  Abdominal:     General: Bowel sounds are normal. There is no distension.     Palpations: Abdomen is soft.     Tenderness: There is no abdominal tenderness.  Musculoskeletal: Normal range of motion.        General: No tenderness or deformity.  Skin:    General: Skin is warm and dry.     Findings: No erythema or rash.  Neurological:     Mental Status: She is alert and oriented to person, place, and time.  Psychiatric:        Behavior: Behavior normal.      ED Treatments / Results  Labs (all labs ordered are listed, but only abnormal results are displayed) Labs Reviewed  GI PATHOGEN PANEL BY PCR, STOOL  C DIFFICILE QUICK SCREEN W PCR REFLEX  CBC WITH DIFFERENTIAL/PLATELET  COMPREHENSIVE METABOLIC PANEL  LIPASE, BLOOD    EKG None  Radiology No results found.  Procedures Procedures (including critical care time)  Medications Ordered in ED  Medications - No data to display   Initial Impression / Assessment and Plan / ED Course  I have reviewed the triage vital signs and the nursing notes.  Pertinent labs & imaging results that were available during my care of the patient were reviewed by me and considered in my medical decision making (see chart for details).        Patient presents with intermittent constipation and diarrhea.  Diarrhea occurs after she takes what sounds like MiraLAX.  Patient has had a bowel movement while in the ED but discussed with the tech and it was solid stool.  Unable to send cultures.  Her blood  work shows a slightly low sodium, otherwise unremarkable.  Abdomen soft nontender palpation.  Vital signs stable.  Patient feels well.  Encouraged her to stop taking her laxative.  Patient is very well-appearing, no acute distress, nontoxic, non-lethargic.  She is ready and stable for discharge.   At this time there does not appear to be any evidence of an acute emergency medical condition and the patient appears stable for discharge with appropriate outpatient follow up.Diagnosis was discussed with patient who verbalizes understanding and is agreeable to discharge. Pt case discussed with Dr. Allen who agrees with my pFreida Busmanlan.   Final Clinical Impressions(s) / ED Diagnoses   Final diagnoses:  None    ED Discharge Orders    None       Cl[place]borne ArtistKendrick, Alissa Pharr S, PA-C 01/23/19 1707    Lorre NickAllen, Anthony, MD 01/24/19 1410

## 2019-01-23 NOTE — ED Provider Notes (Signed)
Medical screening examination/treatment/procedure(s) were conducted as a shared visit with non-physician practitioner(s) and myself.  I personally evaluated the patient during the encounter.    78 year old female here with diarrhea after using laxative for constipation.  She has no abdominal pain or fever.  Electrolytes are reassuring with exception of mild elevation of her glucose and slight hyponatremia with sodium of 129.  Hemoglobin is stable.  Will discharge home   Lacretia Leigh, MD 01/23/19 1055

## 2019-01-23 NOTE — Discharge Instructions (Signed)
Stop taking your laxative.  Please  is follow-up with your primary care provider within a week for continued evaluation.  Return to the ED immediately for new or worsening symptoms or concerns, such as fevers, abdominal pain, vomiting, black stools or any concerns at all.

## 2019-01-23 NOTE — ED Triage Notes (Signed)
Pt states she is having irregular bowel movements and bouts of constipation. Stated she had a BM this morning, and yesterday morning. States she has been having this issue for quite some time.

## 2019-02-02 ENCOUNTER — Other Ambulatory Visit: Payer: Self-pay

## 2019-02-02 ENCOUNTER — Ambulatory Visit
Admission: RE | Admit: 2019-02-02 | Discharge: 2019-02-02 | Disposition: A | Discharge status: Home / Self Care | Payer: PPO | Source: Ambulatory Visit | Attending: Family Medicine | Admitting: Family Medicine

## 2019-02-02 ENCOUNTER — Other Ambulatory Visit: Payer: Self-pay | Admitting: Family Medicine

## 2019-02-02 DIAGNOSIS — K5900 Constipation, unspecified: Secondary | ICD-10-CM

## 2019-02-02 DIAGNOSIS — M353 Polymyalgia rheumatica: Secondary | ICD-10-CM | POA: Diagnosis not present

## 2019-02-02 IMAGING — CR DG ABDOMEN 1V
1 series · 1 of 1 positions shown · non-contrast
Comparison: [DATE]

CLINICAL DATA: Constipation

EXAM:
ABDOMEN - 1 VIEW

[t abdomen supine]
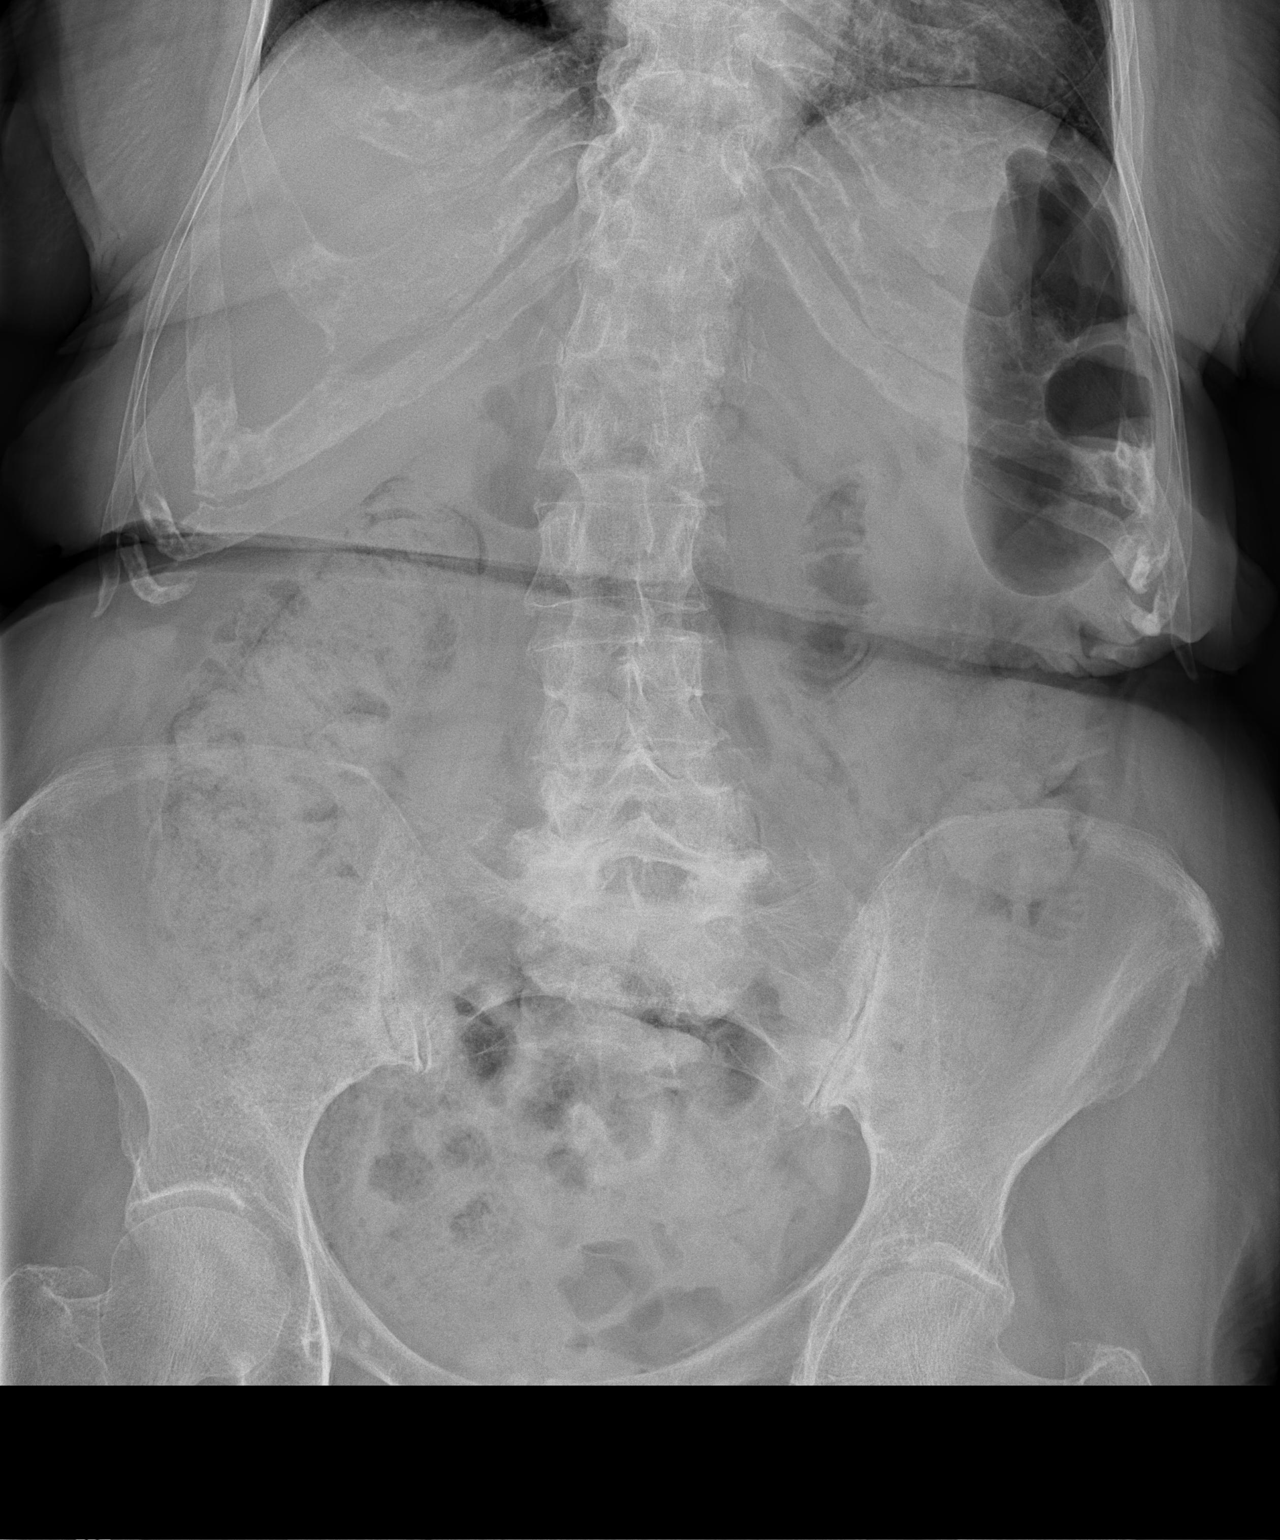

[1 of 1 positions shown; findings below may reference images not displayed]

FINDINGS: The bowel gas pattern is nonobstructive. Moderate colonic stool
burden. No radio-opaque calculi or other significant radiographic
abnormality are seen. Degenerative changes of the lower lumbar
spine.
IMPRESSION: Unremarkable bowel gas pattern with moderate colonic stool burden.

## 2019-02-04 ENCOUNTER — Other Ambulatory Visit: Payer: Self-pay | Admitting: Family Medicine

## 2019-02-04 ENCOUNTER — Ambulatory Visit
Admission: RE | Admit: 2019-02-04 | Discharge: 2019-02-04 | Disposition: A | Discharge status: Home / Self Care | Payer: PPO | Source: Ambulatory Visit | Attending: Family Medicine | Admitting: Family Medicine

## 2019-02-04 DIAGNOSIS — R109 Unspecified abdominal pain: Secondary | ICD-10-CM

## 2019-02-04 DIAGNOSIS — K5730 Diverticulosis of large intestine without perforation or abscess without bleeding: Secondary | ICD-10-CM | POA: Diagnosis not present

## 2019-02-04 IMAGING — CT CT ABD-PELV W/O CM
2 of 4 series · 16 of 46 positions shown, 18 images · non-contrast
Comparison: CT [DATE]

CLINICAL DATA: Chronic constipation, laxitives but with no relief
symptoms x > 2 months No surg no hx ca

EXAM:
CT ABDOMEN AND PELVIS WITHOUT CONTRAST
TECHNIQUE: Multidetector CT imaging of the abdomen and pelvis was performed
following the standard protocol without IV contrast.

[Series 2: routine abdomen pelvis without 5.00 br40 s3 axial · axial · non-contrast · 0.68mm/px · z∈[+1382,+1782]mm · 13 of 88 slices shown, 15 images]
[im 4/88  soft-tissue]
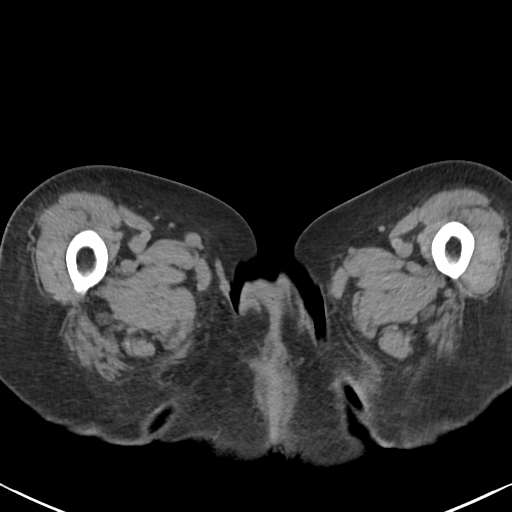
[im 4/88  bone]
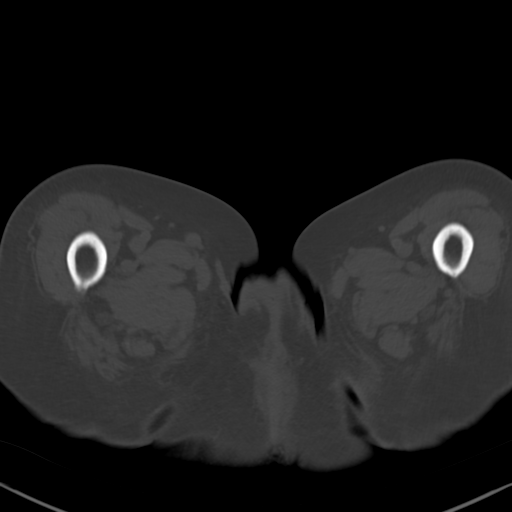
[im 11/88  soft-tissue]
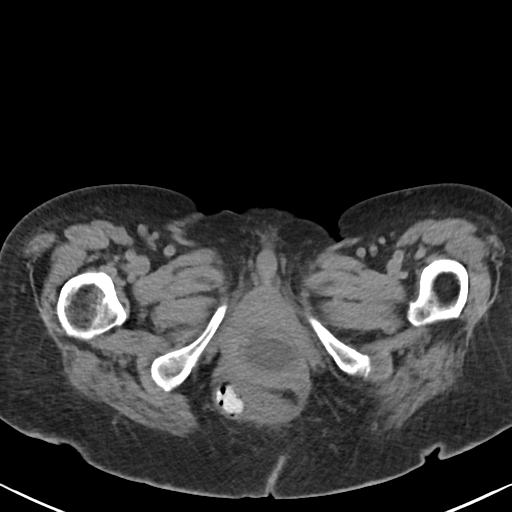
[im 19/88  soft-tissue]
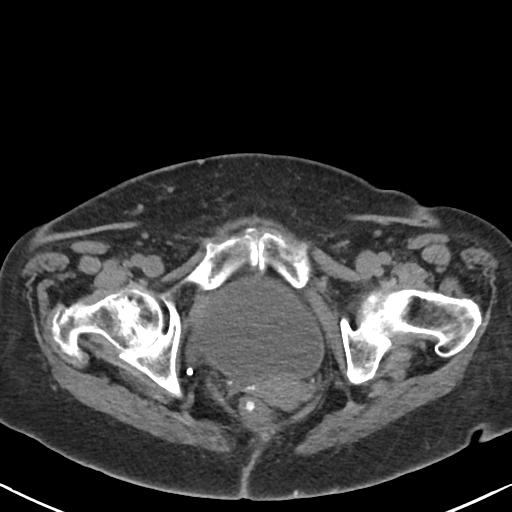
[im 26/88  soft-tissue]
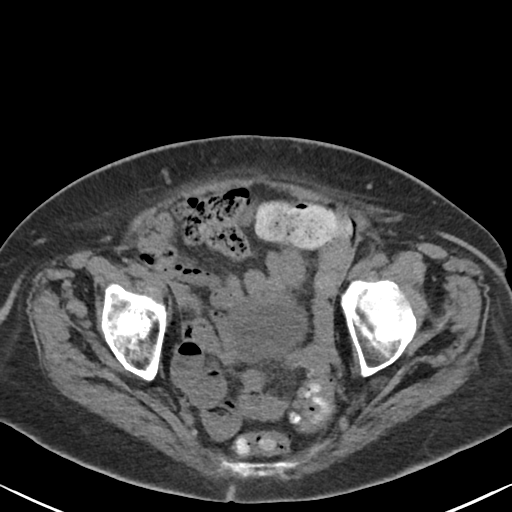
[im 30/88  soft-tissue]
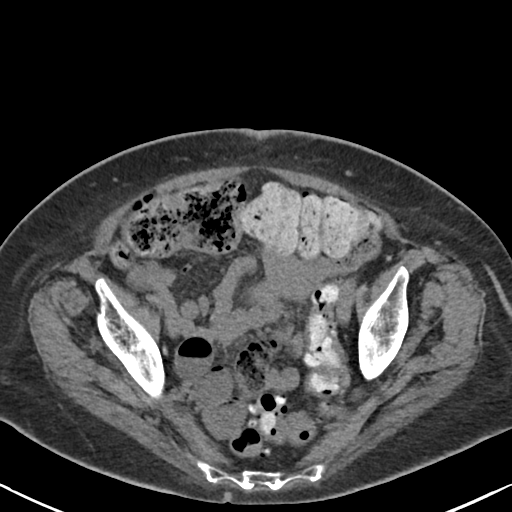
[im 37/88  soft-tissue]
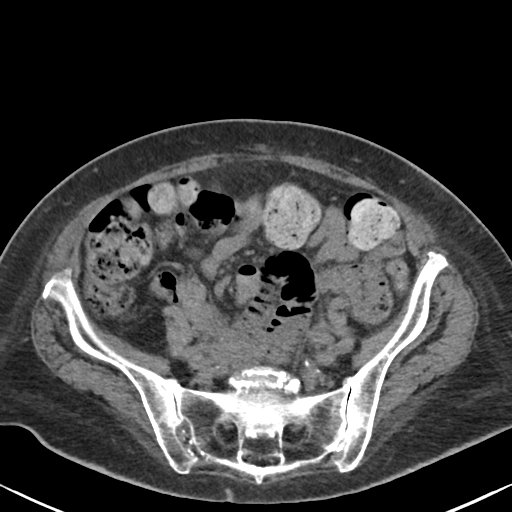
[im 44/88  soft-tissue]
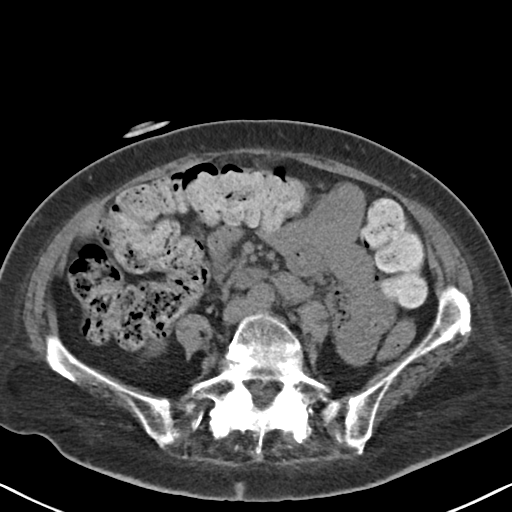
[im 51/88  soft-tissue]
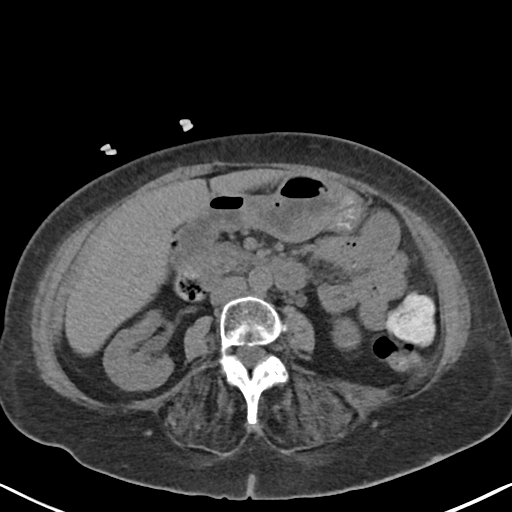
[im 59/88  soft-tissue]
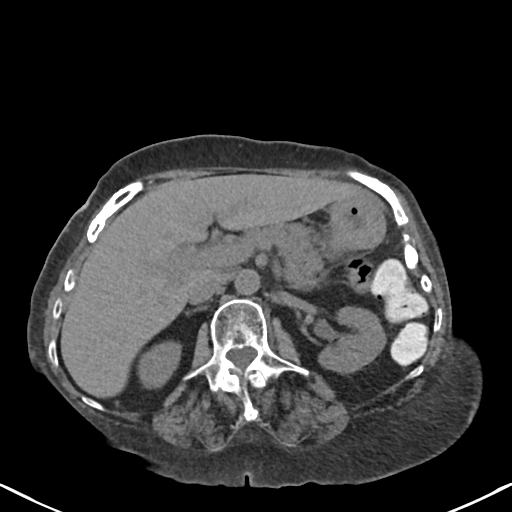
[im 59/88  bone]
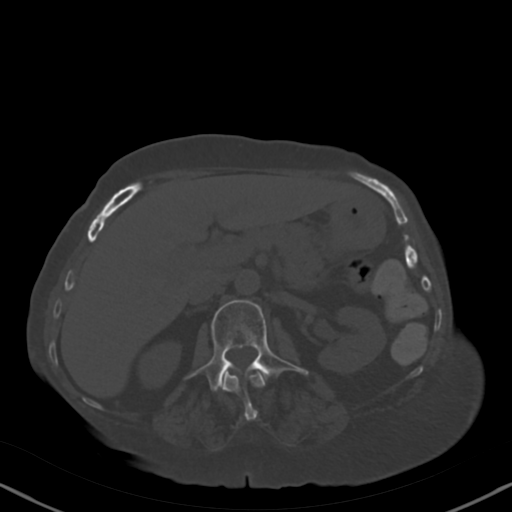
[im 62/88  soft-tissue]
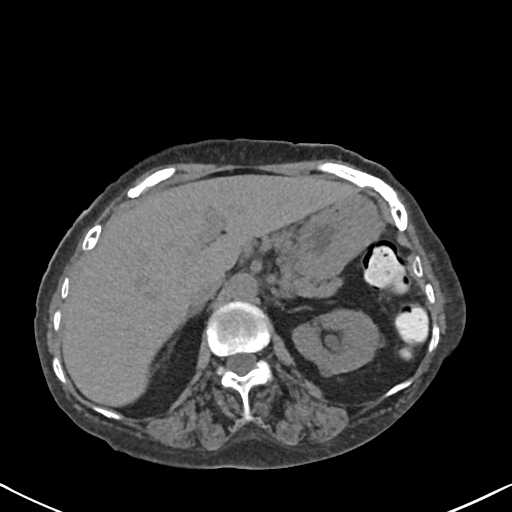
[im 69/88  soft-tissue]
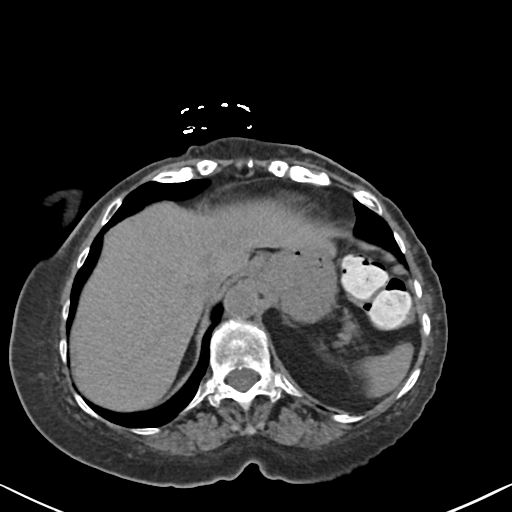
[im 77/88  soft-tissue]
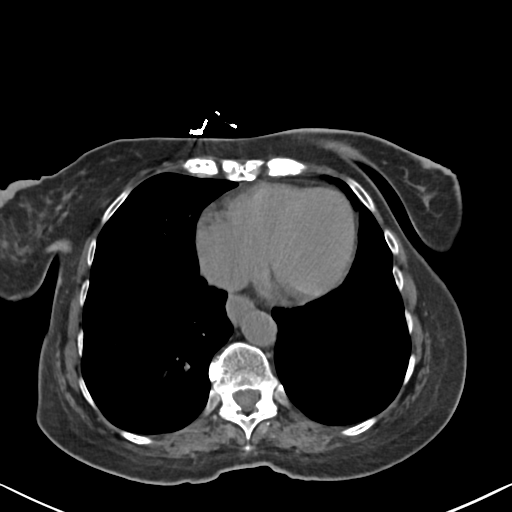
[im 84/88  soft-tissue]
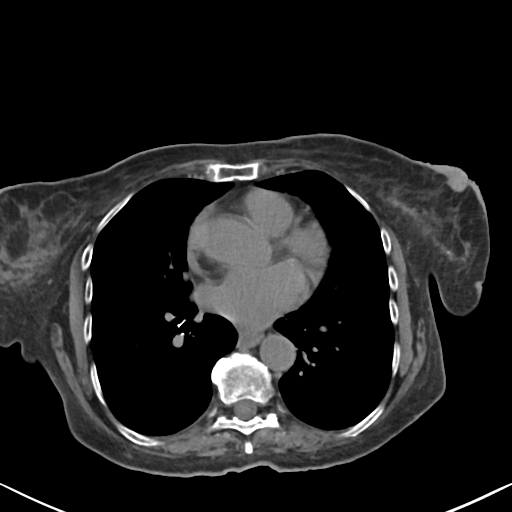

[Series 4: routine abdomen pelvis without 2.00 br40 s3 cor · coronal · non-contrast · 0.68mm/px · 3 of 140 slices shown]
[im 47/140  soft-tissue]
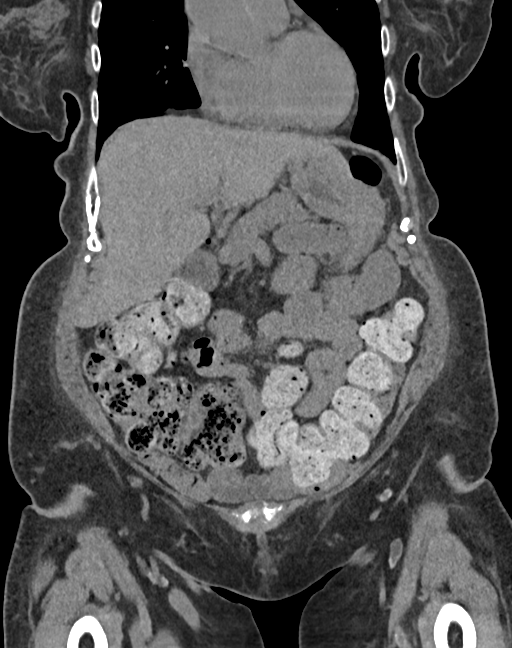
[im 62/140  soft-tissue]
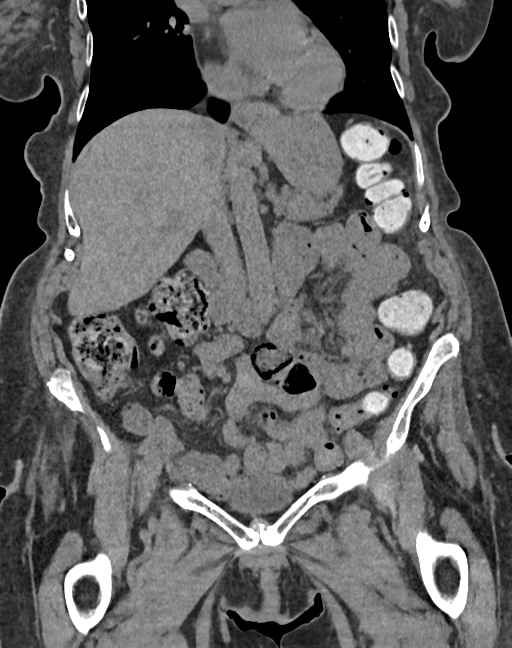
[im 78/140  soft-tissue]
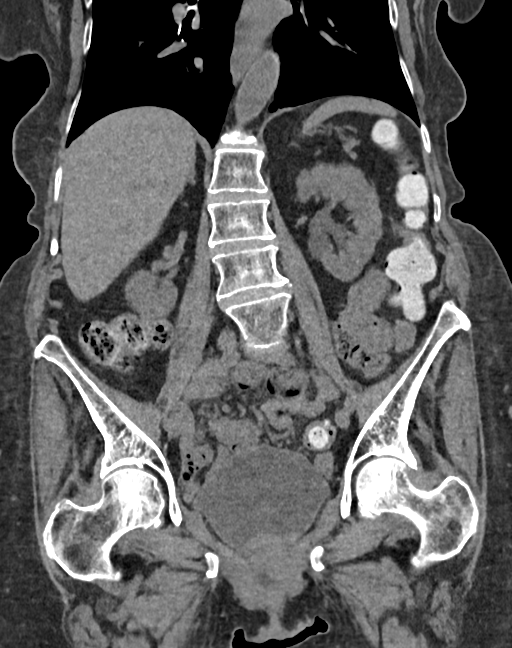

[16 of 46 positions shown; findings below may reference images not displayed]

FINDINGS: Lower chest: Lung bases are clear.

Hepatobiliary: No focal hepatic lesion. No biliary duct dilatation.
Gallbladder is normal. Common bile duct is normal.

Pancreas: Pancreas is normal. No ductal dilatation. No pancreatic
inflammation.

Spleen: Normal spleen

Adrenals/urinary tract: Adrenal glands and kidneys are normal. The
ureters and bladder normal.

Stomach/Bowel: Small hiatal hernia. Stomach, duodenum small-bowel
normal. Cecum and appendix normal. Moderate volume stool the
ascending and transverse colon. Descending colon is collapsed.
Diverticula sigmoid colon. Rectum normal. No evidence of bowel
obstruction or inflammation.

Vascular/Lymphatic: Abdominal aorta is normal caliber. No periportal
or retroperitoneal adenopathy. No pelvic adenopathy.

Reproductive: Uterus and adnexa normal

Other: No free fluid.

Musculoskeletal: Degenerate change from L4-S1 with joint space
narrowing and sclerosis. Minimal grade 1 anterolisthesis. No para
fracture
IMPRESSION: 1. No acute findings in the abdomen pelvis.
2. Moderate volume stool in the ascending and transverse colon.
3. Sigmoid diverticulosis without diverticulitis.
4. Small hiatal hernia.

## 2019-02-05 ENCOUNTER — Other Ambulatory Visit: Payer: PPO

## 2019-02-08 NOTE — Progress Notes (Signed)
[facility]    Provider:  Dr Lucia Gaskins Requesting Provider: Verlon Au, MD Primary Care Provider:  Verlon Au, MD  CC:  Cognitive dysfunction  HPI:  Victoria Tyler is a 78 y.o. female here as requested by Verlon Au, MD for cognitive impairment/dementia.  Past medical history hypertension, anxiety and suicidal ideations, panic attacks, late onset Alzheimer's dementia, adjustment disorder with mixed anxiety and depressed mood, infectious colitis, type 2 diabetes, acute kidney injury, high anion gap metabolic acidosis, hyponatremia, normocytic anemia, fall.  Per Dr. Sharrell Ku notes she was recently hospitalized for suicidal feelings.  I reviewed emergency room notes, the patient did present there January 18, 2019, very anxious, hyperventilating, she was perseverating, otherwise exam was nonfocal, change in demeanor was noted when they told her husband coming to pick her up, they had received some distressing information and diagnosed with a panic attack.  Emergency room states Alzheimer's is also in her history although I do not see that in her problem list.  However it says she was just recently diagnosed by her PCP.  Patient thinks she has Alzheimers. She keeps asking the same questions iver and over. No FHx of dementia. Symptoms started a year or more ago. Patient feels it is not improving, not significantly worsening. She has constipation. She forgets things. She perseverates on constipation. Husband provides much information and he says he only noticed 3 weeks ago, patient asked him to come in and help with a quilt and she became up set and asked him to get rid pf the quilting machine, he had to get rid of a lot of things at her demand, he put it in the living room and then she was upset and anxious it was leaving, overwhelmed, hyperventilating, said she "wished I could shoot myself" and it scared her husband, says she has felt this way for months, she was taken  to the hospital. 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 Husband had to take over her medication management, she is forgetting a lot of things. Husband thinks it was drastic, patient does not. He states he did not notice anything a year ago. She seems angry. No hx of bipolar, depression, anxiety, no hallucinations, no delusions.   Reviewed notes, labs and imaging from outside physicians, which showed:  I reviewed Dr. Sharrell Ku notes.  Patient was discharged from [facility] January 18, 2019, reason for admission was suicidal, when seen patient said she was not feeling well, but she denied having thoughts to harm herself or others, feeling like she is losing her life, sleeping okay, constipated, feels well physically, pain-free, she presented with severe pain, subacute onset, shoulder hips thighs, elevated sed rate, steroid responsive and Dr. Leavy Cella thought she might have PMR, she continues on prednisone 30 mg daily.  Patient was taken to the emergency room by her husband, she became extremely anxious, repeating phrases like "on a 1 to put in a nursing home to die there", prior to that in September 2020 when she was last seen she was in her usual state of good mental health and personality.  She never smoked.  No alcohol use no drugs.  Personally reviewed MRi brian images and agree: IMPRESSION: No acute intracranial abnormality and largely unremarkable for age noncontrast MRI appearance of the brain.  tsh nml, ethanol and drug screen negative,   Review of Systems:  Patient complains of symptoms per HPI as well as the following symptoms: memory loss, agitation. Pertinent negatives and positives per HPI. All others negative.   Social History   Socioeconomic History   Marital status: Married    Spouse name: Not on file   Number of children: 2   Years of education: 12   Highest education level:  High school graduate  Occupational History   Occupation: [employer]  [employer]eds   Financial resource strain: Not on file   Food insecurity    Worry: Not on file    Inability: Not on file   Transportation needs    Medical: Not on file    Non-medical: Not on file  Tobacco Use   Smoking status: Never Smoker   Smokeless tobacco: Never Used  Substance and Sexual Activity   Alcohol use: No   Drug use: No   Sexual activity: Yes  Lifestyle   Physical activity    Days per week: Not on file    Minutes per session: Not on file   Stress: Not on file  Relationships   Social connections    Talks on phone: Not on file    Gets together: Not on file    Attends religious service: Not on file    Active member of club or organization: Not on file    Attends meetings of clubs or organizations: Not on file    Relationship status: Not on file   Intimate partner violence    Fear of current or ex partner: Not on file    Emotionally abused: Not on file    Physically abused: Not on file    Forced sexual activity: Not on file  Other Topics Concern   Not on file  Social History Narrative   [place] lives in [place] with her husband.  She has grown children.  She is retired.  She does not have a psychiatrist.      Update 02/09/2019   Lives at home with husband   Right handed   Caffeine: 1 cup of coffee in the morning, sometimes coke the rest of the day     Family History  Problem Relation Age of Onset   Heart attack Father    Dementia Neg Hx    Alzheimer's disease Neg Hx     Past Medical History:  Diagnosis Date   Diabetes mellitus without complication [facility](HCC)     Patient Active Problem List   Diagnosis Date Noted   Hypoglycemia 10/30/2015   Fall 10/30/2015   Colitis 04/24/2014   Essential hypertension 04/24/2014   Controlled type 2 diabetes mellitus with microalbuminuria or microproteinuria 04/24/2014   Hypotension 04/24/2014   High anion gap metabolic  acidosis 04/24/2014   Acute kidney injury (HCC) 04/24/2014   Hyponatremia 04/24/2014   Normocytic anemia 04/24/2014   Infectious colitis 04/24/2014    Past Surgical History:  Procedure Laterality Date   HERNIA REPAIR     TONSILLECTOMY      Current Outpatient Medications  Medication Sig Dispense Refill   alendronate (FOSAMAX) 70 MG tablet Take 70 mg by mouth once a week.      Ascorbic Acid (VITAMIN C) 1000 MG tablet Take 1,000 mg by mouth daily.     aspirin (GOODSENSE ASPIRIN) 81 MG chewable tablet Chew 81 mg by mouth daily.     beta carotene w/minerals (OCUVITE) tablet Take 1 tablet by mouth daily.     Calcium Carbonate-Vitamin D 600-400 MG-UNIT tablet Take 1 tablet by mouth daily.  Cholecalciferol (VITAMIN D3) 1000 units CAPS Take 1,000 Units by mouth daily.     [employer] ARTHRITIS PAIN RELIEF 650 MG CR tablet Take 650 mg by mouth every 8 (eight) hours as needed for pain.      doxycycline (VIBRAMYCIN) 100 MG capsule Take 100 mg by mouth daily.      hydrOXYzine (ATARAX/VISTARIL) 25 MG tablet Take 1 tablet (25 mg total) by mouth 2 (two) times daily as needed. 30 tablet 0   lisinopril (PRINIVIL,ZESTRIL) 20 MG tablet Take 20 mg by mouth daily.  0   meloxicam (MOBIC) 7.5 MG tablet Take 7.5 mg by mouth daily.     omeprazole (PRILOSEC) 40 MG capsule Take 40 mg by mouth 2 (two) times daily.      oxybutynin (DITROPAN) 5 MG tablet Take 5 mg by mouth 2 (two) times daily.  3   trimethoprim (TRIMPEX) 100 MG tablet Take 100 mg by mouth daily.     lactulose (CHRONULAC) 10 GM/15ML solution Take 10 g by mouth daily as needed for moderate constipation.      No current facility-administered medications for this visit.     Allergies as of 02/09/2019 - Review Complete 02/09/2019  Allergen Reaction Noted   Ciprofloxacin  04/24/2014    Vitals: BP (!) 144/77 (BP Location: Right Arm, Patient Position: Sitting)    Pulse 87    Temp 97.8 F (36.6 C) Comment: husband 97.5, both taken  at front door   Ht 5\' 2"  (1.575 m)    Wt 137 lb (62.1 kg)    BMI 25.06 kg/m  Last Weight:  Wt Readings from Last 1 Encounters:  02/09/19 137 lb (62.1 kg)   Last Height:   Ht Readings from Last 1 Encounters:  02/09/19 5\' 2"  (1.575 m)     Physical exam: Exam: Gen: NAD, conversant, tangential, perseverates on constipation                CV: RRR, no MRG. No Carotid Bruits. No peripheral edema, warm, nontender Eyes: Conjunctivae clear without exudates or hemorrhage  Neuro: Detailed Neurologic Exam  Speech:    Speech is normal; fluent and spontaneous with normal comprehension.  Cognition:  MMSE - Mini Mental State Exam 02/09/2019  Orientation to time 3  Orientation to Place 4  Registration 3  Attention/ Calculation 0  Recall 0  Language- name 2 objects 2  Language- repeat 1  Language- follow 3 step command 3  Language- read & follow direction 1  Write a sentence 1  Copy design 0  Total score 18   Cranial Nerves:    The pupils are equal, round, and reactive to light. Attempted fundoscopy could not visualize. Visual fields are full to finger confrontation. Extraocular movements are intact. Trigeminal sensation is intact and the muscles of mastication are normal. The face is symmetric. The palate elevates in the midline. Hearing intact. Voice is normal. Shoulder shrug is normal. The tongue has normal motion without fasciculations.   Coordination:    No dysmetria  Gait:   Wide based, short steps, using a cane (c/o hip pain)  Motor Observation: no involuntary movements noted. Tone:    Normal muscle tone.    Posture:    Posture is normal. normal erect    Strength:    Strength is symmetrical in the upper and lower limbs.      Sensation: intact to LT     Reflex Exam:  DTR's:    Deep tendon reflexes in the upper and lower extremities  are slightly brisk bilaterally.   Toes:    The toes are equiv bilaterally, possibly right upgoing Clonus:    Clonus is absent.     Assessment/Plan:  78 y.o. female here as requested by Verlon Au, MD for cognitive impairment/dementia.  Past medical history hypertension, anxiety and suicidal ideations, panic attacks, late onset Alzheimer's dementia, adjustment disorder with mixed anxiety and depressed mood, infectious colitis, type 2 diabetes, acute kidney injury, high anion gap metabolic acidosis, hyponatremia, normocytic anemia, fall.  Per Dr. Sharrell Ku notes she was recently hospitalized for suicidal feelings.MRI brain unremarkable.  - Patient with demenitia, Frontotemporal(agitation, personality changes) vs Alzheimers(memory loss) although concerning is possible acute onset per husband(patient disagrees). PEt Scan FDG - Formal neuropsych testing - Labs today  Orders Placed This Encounter  Procedures   NM PET Metabolic Brain   RPR   B12 and Folate Panel   Methylmalonic acid, serum   Vitamin B1   Homocysteine   Basic Metabolic Panel   Ammonia   Ambulatory referral to Neuropsychology    Cc: Verlon Au, MD,    Naomie Dean, MD  [facility] [address] [place], [place] [address]  Phone [phone] Fax [phone]

## 2019-02-09 ENCOUNTER — Ambulatory Visit (INDEPENDENT_AMBULATORY_CARE_PROVIDER_SITE_OTHER): Payer: PPO | Admitting: Neurology

## 2019-02-09 ENCOUNTER — Encounter: Payer: Self-pay | Admitting: Neurology

## 2019-02-09 ENCOUNTER — Other Ambulatory Visit: Payer: Self-pay

## 2019-02-09 VITALS — BP 144/77 | HR 87 | Temp 97.8°F | Ht 62.0 in | Wt 137.0 lb

## 2019-02-09 DIAGNOSIS — F0390 Unspecified dementia without behavioral disturbance: Secondary | ICD-10-CM

## 2019-02-09 DIAGNOSIS — G3109 Other frontotemporal dementia: Secondary | ICD-10-CM

## 2019-02-09 DIAGNOSIS — F0391 Unspecified dementia with behavioral disturbance: Secondary | ICD-10-CM

## 2019-02-09 NOTE — Patient Instructions (Signed)
Scan of the brain to look at function - FDG PET SCAN can differentiate between different types of dementia - will call to schedule Formal Memory Testing - will call to schedule Bloodwork today   Dementia Dementia is a condition that affects the way the brain functions. It often affects memory and thinking. Usually, dementia gets worse with time and cannot be reversed (progressive dementia). There are many types of dementia, including:  Alzheimer's disease. This type is the most common.  Vascular dementia. This type may happen as the result of a stroke.  Lewy body dementia. This type may happen to people who have Parkinson's disease.  Frontotemporal dementia. This type is caused by damage to nerve cells (neurons) in certain parts of the brain. Some people may be affected by more than one type of dementia. This is called mixed dementia. What are the causes? Dementia is caused by damage to cells in the brain. The area of the brain and the types of cells damaged determine the type of dementia. Usually, this damage is irreversible or cannot be undone. Some examples of irreversible causes include:  Conditions that affect the blood vessels of the brain, such as diabetes, heart disease, or blood vessel disease.  Genetic mutations. In some cases, changes in the brain may be caused by another condition and can be reversed or slowed. Some examples of reversible causes include:  Injury to the brain.  Certain medicines.  Infection, such as meningitis.  Metabolic problems, such as vitamin B12 deficiency or thyroid disease.  Pressure on the brain, such as from a tumor or blood clot. What are the signs or symptoms? Symptoms of dementia depend on the type of dementia. Common signs of dementia include problems with remembering, thinking, problem solving, decision making, and communicating. These signs develop slowly or get worse with time. This may include:  Problems remembering things.  Having  trouble taking a bath or putting clothes on.  Forgetting appointments.  Forgetting to pay bills.  Difficulty planning and preparing meals.  Having trouble speaking.  Getting lost easily. How is this diagnosed? This condition is diagnosed by a specialist (neurologist). It is diagnosed based on the history of your symptoms, your medical history, a physical exam, and tests. Tests may include:  Tests to evaluate brain function, such as memory tests, cognitive tests, and other tests.  Lab tests, such as blood or urine tests.  Imaging tests, such as a CT scan, a PET scan, or an MRI.  Genetic testing. This may be done if other family members have a diagnosis of certain types of dementia. Your health care provider will talk with you and your family, friends, or caregivers about your history and symptoms. How is this treated?  Treatment for this condition depends on the cause of the dementia. Progressive dementias, such as Alzheimer's disease, cannot be cured, but there may be treatments that help to manage symptoms. Treatment might involve taking medicines that may help to:  Control the dementia.  Slow down the progression of the dementia.  Manage symptoms. In some cases, treating the cause of your dementia can improve symptoms, reverse symptoms, or slow down how quickly your dementia becomes worse. Your health care provider can direct you to support groups, organizations, and other health care providers who can help with decisions about your care. Follow these instructions at home: Medicines  Take over-the-counter and prescription medicines only as told by your health care provider.  Use a pill organizer or pill reminder to help you manage  your medicines.  Avoid taking medicines that can affect thinking, such as pain medicines or sleeping medicines. Lifestyle  Make healthy lifestyle choices. ? Be physically active as told by your health care provider. ? Do not use any products  that contain nicotine or tobacco, such as cigarettes, e-cigarettes, and chewing tobacco. If you need help quitting, ask your health care provider. ? Do not drink alcohol. ? Practice stress-management techniques when you get stressed. ? Spend time with other people.  Make sure to get quality sleep. These tips can help you get a good night's rest: ? Avoid napping during the day. ? Keep your sleeping area dark and cool. ? Avoid exercising during the few hours before you go to bed. ? Avoid caffeine products in the evening. Eating and drinking  Drink enough fluid to keep your urine pale yellow.  Eat a healthy diet. General instructions   Work with your health care provider to determine what you need help with and what your safety needs are.  Talk with your health care provider about whether it is safe for you to drive.  If you were given a bracelet that identifies you as a person with memory loss or tracks your location, make sure to wear it at all times.  Work with your family to make important decisions, such as advance directives, medical power of attorney, or a living will.  Keep all follow-up visits as told by your health care provider. This is important. Where to find more information  Alzheimer's Association: LimitLaws.hu  [employer] on Aging: CashCowGambling.be  World Health Organization: https://castaneda-walker.com/ Contact a health care provider if:  You have any new or worsening symptoms.  You have problems with choking or swallowing. Get help right away if:  You feel depressed or sad, or feel that you want to harm yourself.  Your family members become concerned for your safety. If you ever feel like you may hurt yourself or others, or have thoughts about taking your own life, get help right away. You can go to your nearest emergency department or call:  Your local emergency services (911 in the [place]  A suicide crisis helpline, such as the National Suicide  Prevention Lifeline at 519 286 0509. This is open 24 hours a day. Summary  Dementia is a condition that affects the way the brain functions. Dementia often affects memory and thinking.  Usually, dementia gets worse with time and cannot be reversed (progressive dementia).  Treatment for this condition depends on the cause of the dementia.  Work with your health care provider to determine what you need help with and what your safety needs are.  Your health care provider can direct you to support groups, organizations, and other health care providers who can help with decisions about your care. This information is not intended to replace advice given to you by your health care provider. Make sure you discuss any questions you have with your health care provider. Document Released: 09/25/2000 Document Revised: 06/16/2018 Document Reviewed: 06/16/2018 Elsevier Patient Education  2020 [employer].

## 2019-02-14 LAB — BASIC METABOLIC PANEL
BUN/Creatinine Ratio: 24 (ref 12–28)
BUN: 20 mg/dL (ref 8–27)
CO2: 24 mmol/L (ref 20–29)
Calcium: 9.6 mg/dL (ref 8.7–10.3)
Chloride: 95 mmol/L — ABNORMAL LOW (ref 96–106)
Creatinine, Ser: 0.84 mg/dL (ref 0.57–1.00)
GFR calc Af Amer: 77 mL/min/{1.73_m2} (ref >59)
GFR calc non Af Amer: 67 mL/min/{1.73_m2} (ref >59)
Glucose: 335 mg/dL — ABNORMAL HIGH (ref 65–99)
Potassium: 4.8 mmol/L (ref 3.5–5.2)
Sodium: 132 mmol/L — ABNORMAL LOW (ref 134–144)

## 2019-02-14 LAB — B12 AND FOLATE PANEL
Folate: >20 ng/mL (ref >3.0)
Vitamin B-12: 890 pg/mL (ref 232–1245)

## 2019-02-14 LAB — VITAMIN B1: Thiamine: 175.6 nmol/L (ref 66.5–200.0)

## 2019-02-14 LAB — AMMONIA: Ammonia: 37 ug/dL (ref 31–169)

## 2019-02-14 LAB — METHYLMALONIC ACID, SERUM: Methylmalonic Acid: 270 nmol/L (ref 0–378)

## 2019-02-14 LAB — RPR: RPR Ser Ql: NONREACTIVE

## 2019-02-14 LAB — HOMOCYSTEINE: Homocysteine: 12.1 umol/L (ref 0.0–19.2)

## 2019-02-23 DIAGNOSIS — G479 Sleep disorder, unspecified: Secondary | ICD-10-CM | POA: Diagnosis not present

## 2019-02-23 DIAGNOSIS — K5900 Constipation, unspecified: Secondary | ICD-10-CM | POA: Insufficient documentation

## 2019-02-23 DIAGNOSIS — R4189 Other symptoms and signs involving cognitive functions and awareness: Secondary | ICD-10-CM | POA: Insufficient documentation

## 2019-02-23 DIAGNOSIS — E119 Type 2 diabetes mellitus without complications: Secondary | ICD-10-CM | POA: Diagnosis not present

## 2019-02-25 ENCOUNTER — Telehealth: Payer: Self-pay | Admitting: Neurology

## 2019-02-25 DIAGNOSIS — M353 Polymyalgia rheumatica: Secondary | ICD-10-CM | POA: Diagnosis not present

## 2019-02-25 NOTE — Telephone Encounter (Signed)
Pts husband is calling in wanting to know if a MRI order was placed or a memory test , advised him I didn't see anything. He then began yelling and stated that it was suppose to be done and he wants to know information on her appt with [facility] advised him he would need to contact that specific office for information on her appointments. He then proceeded to state we should know what they are doing in the office if we sent her there.

## 2019-02-25 NOTE — Telephone Encounter (Signed)
I called the pt's husband back (Ron, on [place]) and advised him the [facility] appts are for formal memory testing that Dr. Jaynee Eagles ordered. Testing on 12/29 & results review on 04/21/19 @ 2:30 pm. His questions were answered. He also understands the pet metabolic brain scan is pending insurance auth. He stated he would call insurance to check on the status. He also mentioned canceling the f/u with Dr. Jaynee Eagles but I advised the memory testing would only be two appts and then pt would f/u with Dr. Jaynee Eagles.  to allow for results review first before seeing her in office, the appt was moved to 04/26/19 @ 2 pm arrival 15 minutes early. The husband asked why he had to arrive early and I advised we ask all patients to arrive early to allow for time to check-in and if they are late we cannot guarantee we can see them. During the call  Ron mentioned the patient hears noises during the day, easily distracted, hasn't slept well at night, has an appt with psychology next month and mentioned something about Dr. Jaynee Eagles but I was having trouble hearing everything he said (voice sounded too far away and there was background noise). I apologized and kindly asked him to repeat and explained I wanted to make sure I got all he said. He got very aggravated and told me he "wasn't going to spend all" and he would just keep the appts. I tried once more to kindly gather more information and he told me to "drop it" and then hung up while I was attempting to say goodbye and end the call.

## 2019-03-08 ENCOUNTER — Other Ambulatory Visit: Payer: Self-pay

## 2019-03-08 ENCOUNTER — Ambulatory Visit (HOSPITAL_COMMUNITY)
Admission: RE | Admit: 2019-03-08 | Discharge: 2019-03-08 | Disposition: A | Discharge status: Home / Self Care | Payer: PPO | Source: Ambulatory Visit | Attending: Neurology | Admitting: Neurology

## 2019-03-08 DIAGNOSIS — G319 Degenerative disease of nervous system, unspecified: Secondary | ICD-10-CM | POA: Diagnosis not present

## 2019-03-08 DIAGNOSIS — F0391 Unspecified dementia with behavioral disturbance: Secondary | ICD-10-CM | POA: Insufficient documentation

## 2019-03-08 DIAGNOSIS — G3109 Other frontotemporal dementia: Secondary | ICD-10-CM | POA: Insufficient documentation

## 2019-03-08 DIAGNOSIS — E119 Type 2 diabetes mellitus without complications: Secondary | ICD-10-CM | POA: Insufficient documentation

## 2019-03-08 DIAGNOSIS — R0600 Dyspnea, unspecified: Secondary | ICD-10-CM | POA: Diagnosis not present

## 2019-03-08 DIAGNOSIS — R600 Localized edema: Secondary | ICD-10-CM | POA: Diagnosis not present

## 2019-03-08 DIAGNOSIS — F0390 Unspecified dementia without behavioral disturbance: Secondary | ICD-10-CM | POA: Diagnosis not present

## 2019-03-08 LAB — GLUCOSE, CAPILLARY: Glucose-Capillary: 115 mg/dL — ABNORMAL HIGH (ref 70–99)

## 2019-03-08 IMAGING — PT NM PET BRAIN AMYLOID
5 series · 22 of 25 positions shown · non-contrast
Comparison: Brain MRI [DATE]

CLINICAL DATA: Dementia. Frontotemporal dementia versus Alzheimer's
type dementia

EXAM:
NM PET METABOLIC BRAIN
TECHNIQUE: 10.3 mCi F-18 FDG was injected intravenously. Full-ring PET imaging
was performed from the vertex to skull base. CT data was obtained
and used for attenuation correction and anatomic localization.
FASTING BLOOD GLUCOSE:  Value: 115 mg/dl

[Series 3: pet brain · axial · 3.0mm · 1.02mm/px · z∈[+28,+192]mm · 7 of 110 slices shown]
[im 1/110]
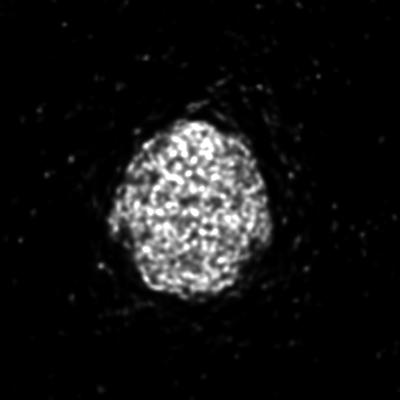
[im 16/110]
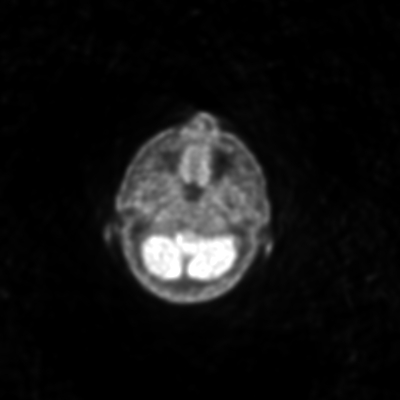
[im 32/110]
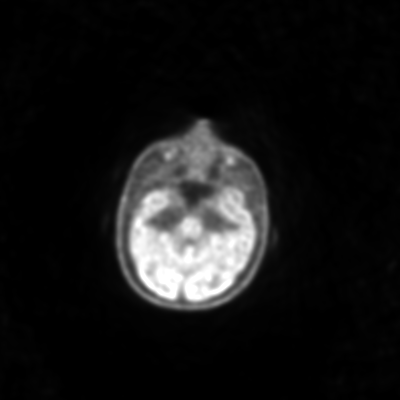
[im 47/110]
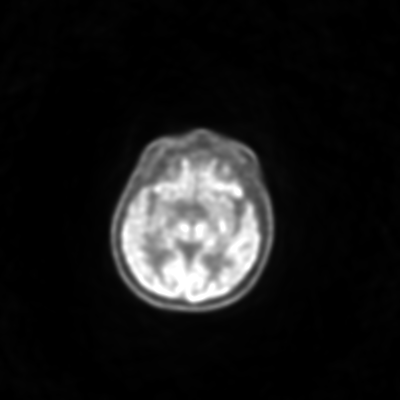
[im 78/110]
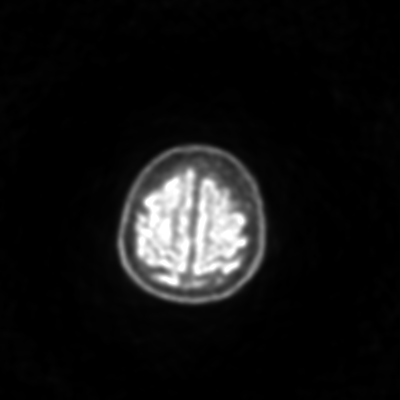
[im 94/110]
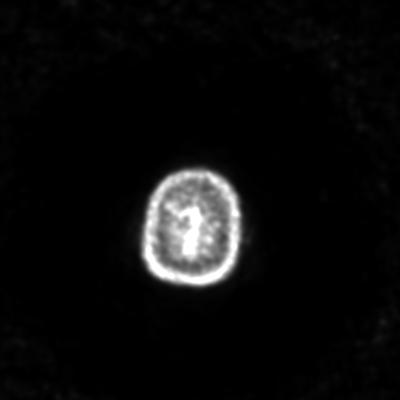
[im 110/110]
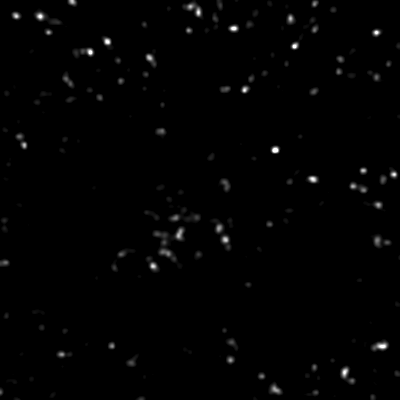

[Series 4: ct brain 3.0 h31s · axial · 3.0mm · 0.41mm/px · z∈[+28,+192]mm · 7 of 108 slices shown]
[im 1/108  brain]
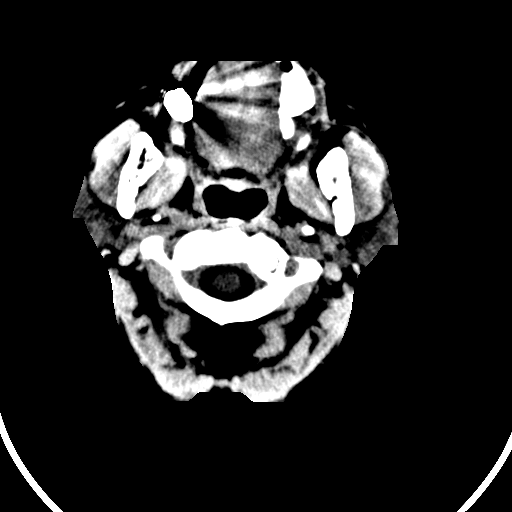
[im 16/108  brain]
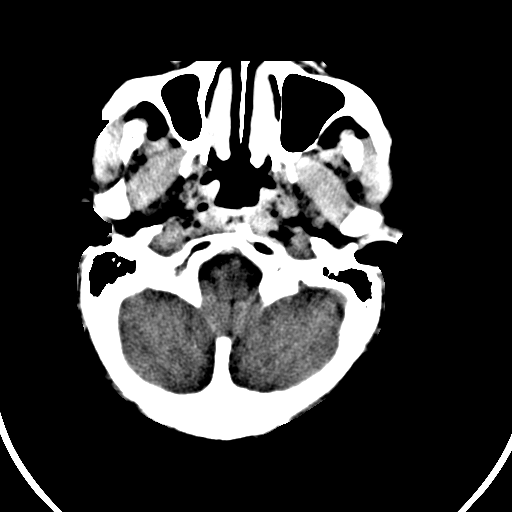
[im 31/108  brain]
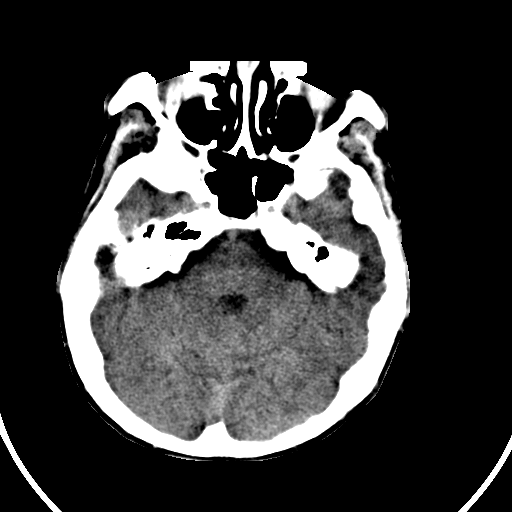
[im 46/108  brain]
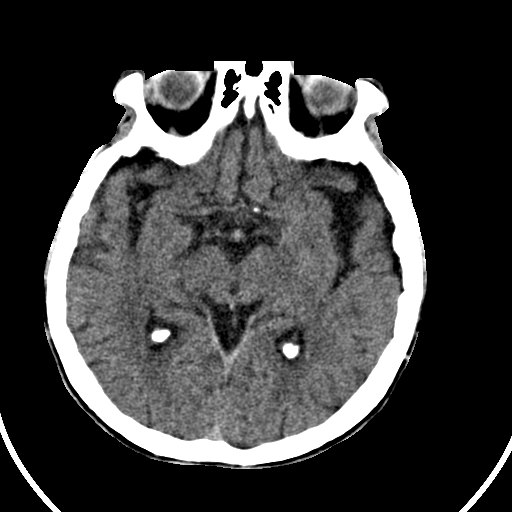
[im 77/108  brain]
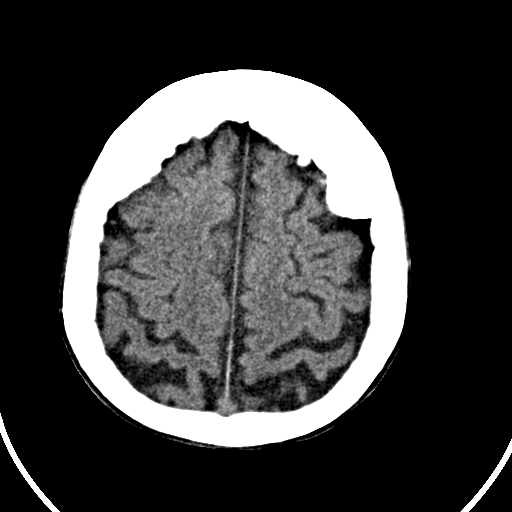
[im 92/108  bone]
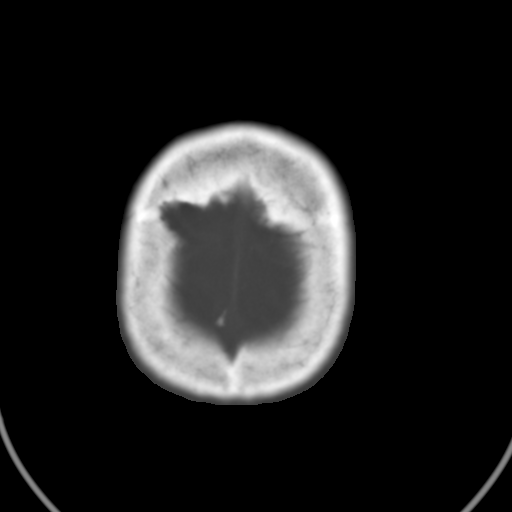
[im 108/108  bone]
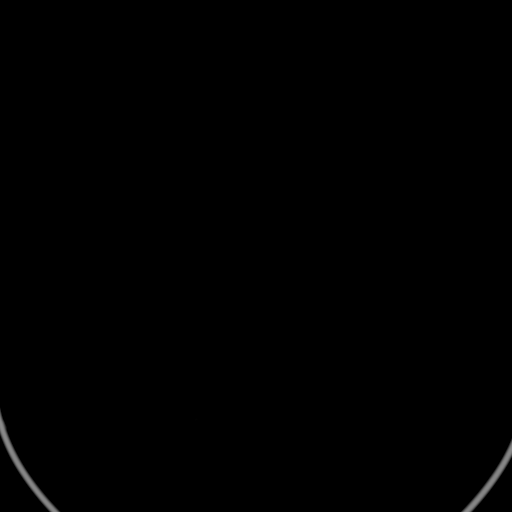

[Series 603: mip range 2 · coronal · 0.84mm/px · 2 of 32 slices shown]
[im 1/32]
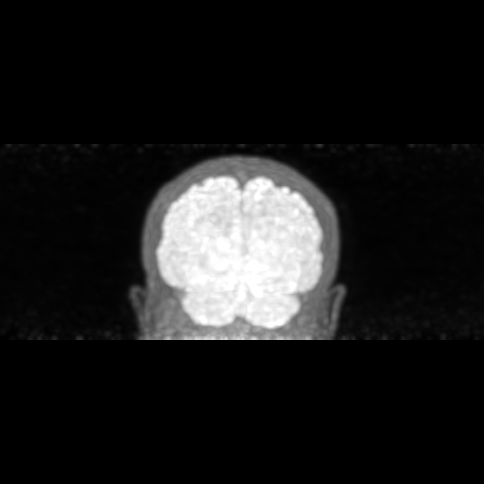
[im 32/32]
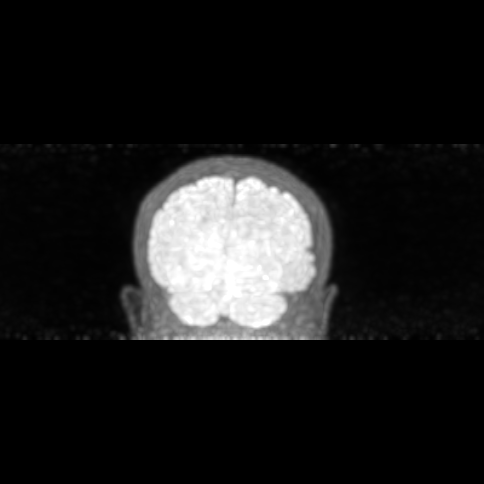

[Series 604: range-ct brain 3.0 (id)<alpha range> · 2 of 39 slices shown (1 of 2)]
[im 1/39]
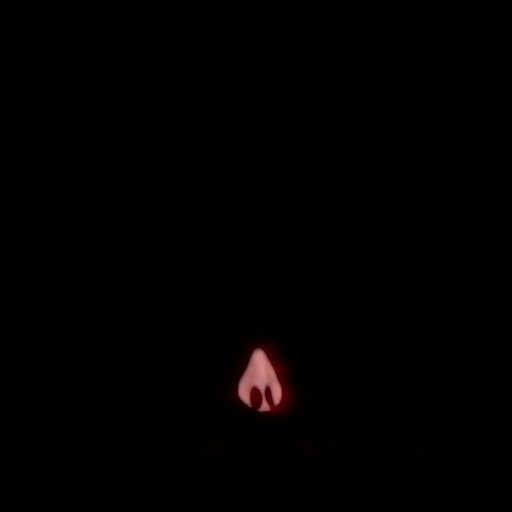
[im 20/39]
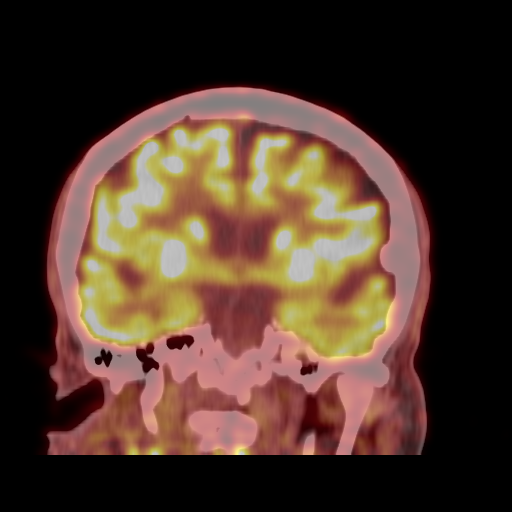

[Series 605: range-ct brain 3.0 (id)<alpha range> · 4 of 53 slices shown (2 of 2)]
[im 1/53]
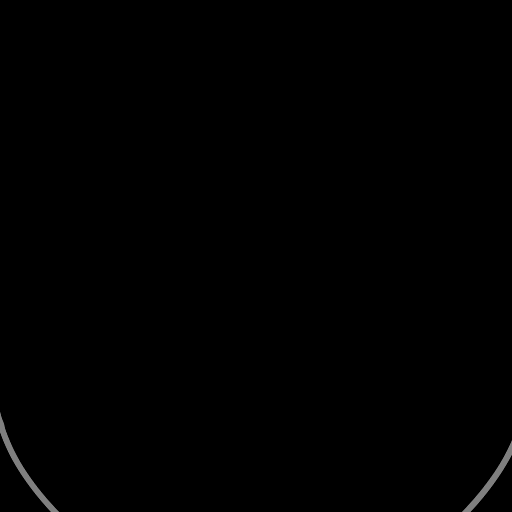
[im 18/53]
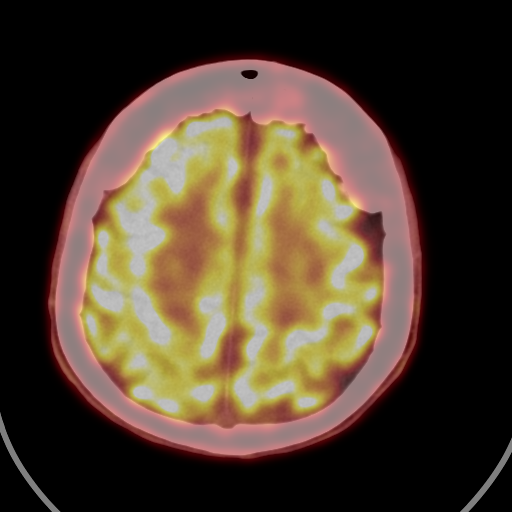
[im 35/53]
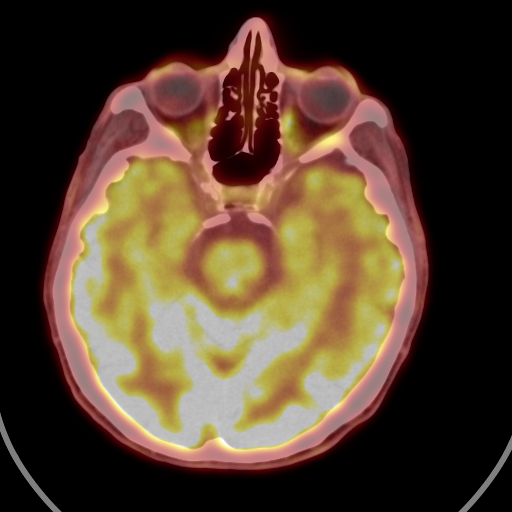
[im 53/53]
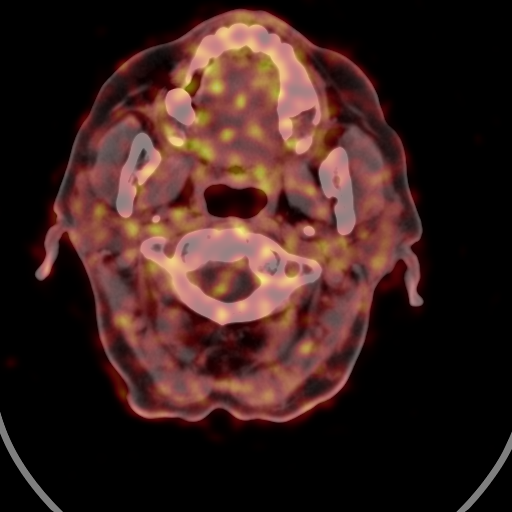

[22 of 25 positions shown; findings below may reference images not displayed]

FINDINGS: No relative decreased cortical metabolism within the frontal lobes
or parietal lobes. There is generalized cortical atrophy noted.
Normal activity in the occipital lobes.
IMPRESSION: No decreased relative cortical metabolism to suggest Alzheimer's
disease pathology or frontotemporal dementia. Mild generalized
cortical atrophy.

## 2019-03-08 MED ORDER — FLUDEOXYGLUCOSE F - 18 (FDG) INJECTION
10.0300 | Freq: Once | INTRAVENOUS | Status: AC | PRN
  Administered 2019-03-08: 10.03 via INTRAVENOUS

## 2019-03-09 ENCOUNTER — Telehealth: Payer: Self-pay | Admitting: Neurology

## 2019-03-09 NOTE — Telephone Encounter (Signed)
I called the husband.  The PET scan was normal, no indication for Alzheimer's disease or frontotemporal dementia.   PET scan 03/08/19:  IMPRESSION: No decreased relative cortical metabolism to suggest Alzheimer's disease pathology or frontotemporal dementia. Mild generalized cortical atrophy.

## 2019-03-10 DIAGNOSIS — M7989 Other specified soft tissue disorders: Secondary | ICD-10-CM | POA: Diagnosis not present

## 2019-03-10 DIAGNOSIS — R600 Localized edema: Secondary | ICD-10-CM | POA: Diagnosis not present

## 2019-03-15 DIAGNOSIS — R0600 Dyspnea, unspecified: Secondary | ICD-10-CM | POA: Diagnosis not present

## 2019-03-15 DIAGNOSIS — R600 Localized edema: Secondary | ICD-10-CM | POA: Diagnosis not present

## 2019-03-17 DIAGNOSIS — M7989 Other specified soft tissue disorders: Secondary | ICD-10-CM | POA: Insufficient documentation

## 2019-03-17 DIAGNOSIS — Z7189 Other specified counseling: Secondary | ICD-10-CM | POA: Insufficient documentation

## 2019-03-17 NOTE — Progress Notes (Signed)
Cardiology Office Note   Date:  03/18/2019   ID:  Victoria Tyler, DOB 09-11-1940, MRN [id number]  PCP:  Verlon Au, MD  Cardiologist:   No primary care provider on file. Referring:  Verlon Au, MD  Chief Complaint  Patient presents with  . Leg Swelling      History of Present Illness: Victoria Tyler is a 78 y.o. female who was referred by Verlon Au, MD for evaluation of leg swelling.  An echo in 2011 had an EF that was normal.   She was seen by her primary provider not long ago and she swelling.  There was a report of a 14 pound weight gain which the patient denies.  She was treated with diuretics.  She had a BNP that was very mildly elevated at 125.  Her swelling went down with Lasix.  She denies any cardiovascular symptoms.  She denies any chest pressure, neck or arm discomfort.  She does not have any palpitations, presyncope or syncope.  In particular she denies any shortness of breath, PND or orthopnea.  Of note I see mention of a memory disorder and she mentioned this.  I spoke with her husband on the phone during this appointment.  He seems to back up her lack of symptoms but again her memory probably precludes adequate history.  She gets around with a walker slowly.   Past Medical History:  Diagnosis Date  . Diabetes mellitus without complication [facility])     Past Surgical History:  Procedure Laterality Date  . HERNIA REPAIR    . TONSILLECTOMY       Current Outpatient Medications  Medication Sig Dispense Refill  . alendronate (FOSAMAX) 70 MG tablet Take 70 mg by mouth once a week.     . Ascorbic Acid (VITAMIN C) 1000 MG tablet Take 1,000 mg by mouth daily.    Marland Kitchen aspirin (GOODSENSE ASPIRIN) 81 MG chewable tablet Chew 81 mg by mouth daily.    . beta carotene w/minerals (OCUVITE) tablet Take 1 tablet by mouth daily.    . Calcium Carbonate-Vitamin D 600-400 MG-UNIT tablet Take 1 tablet by mouth daily.    . Cholecalciferol (VITAMIN D3) 1000 units CAPS  Take 1,000 Units by mouth daily.    . [employer] ARTHRITIS PAIN RELIEF 650 MG CR tablet Take 650 mg by mouth every 8 (eight) hours as needed for pain.     Marland Kitchen doxycycline (VIBRAMYCIN) 100 MG capsule Take 100 mg by mouth daily.     . furosemide (LASIX) 20 MG tablet Take by mouth.    . hydrOXYzine (ATARAX/VISTARIL) 25 MG tablet Take 1 tablet (25 mg total) by mouth 2 (two) times daily as needed. 30 tablet 0  . lactulose (CHRONULAC) 10 GM/15ML solution Take 10 g by mouth daily as needed for moderate constipation.     Marland Kitchen lisinopril (PRINIVIL,ZESTRIL) 20 MG tablet Take 20 mg by mouth daily.  0  . meloxicam (MOBIC) 7.5 MG tablet Take 7.5 mg by mouth daily.    . metFORMIN (GLUCOPHAGE) 500 MG tablet TAKE 1 TABLET BY MOUTH DAILY FOR 7 DAYS, THEN TAKE ONE TWICE DAILY WITH MEALS    . omeprazole (PRILOSEC) 40 MG capsule Take 40 mg by mouth 2 (two) times daily.     Marland Kitchen oxybutynin (DITROPAN) 5 MG tablet Take 5 mg by mouth 2 (two) times daily.  3  . predniSONE (DELTASONE) 20 MG tablet Take 20 mg by mouth daily.    . traZODone (DESYREL) 50 MG  tablet TAKE 1 TABLET BY MOUTH EVERY DAY AT NIGHT    . trimethoprim (TRIMPEX) 100 MG tablet Take 100 mg by mouth daily.     No current facility-administered medications for this visit.     Allergies:   Ciprofloxacin    Social History:  The patient  reports that she has never smoked. She has never used smokeless tobacco. She reports that she does not drink alcohol or use drugs.   Family History:  The patient's family history includes Heart attack in her father.    ROS:  Please see the history of present illness.   Otherwise, review of systems are positive for none.   All other systems are reviewed and negative.    PHYSICAL EXAM: VS:  BP 124/66   Pulse 81   Temp (!) 97 F (36.1 C)   Ht 5\' 2"  (1.575 m)   Wt 138 lb (62.6 kg)   SpO2 98%   BMI 25.24 kg/m  , BMI Body mass index is 25.24 kg/m. GEN:  No distress NECK:  No jugular venous distention at 90 degrees, waveform  within normal limits, carotid upstroke brisk and symmetric, no bruits, no thyromegaly LYMPHATICS:  No cervical adenopathy LUNGS:  Clear to auscultation bilaterally BACK:  No CVA tenderness CHEST:  Unremarkable HEART:  S1 and S2 within normal limits, no S3, no S4, no clicks, no rubs, no murmurs ABD:  Positive bowel sounds normal in frequency in pitch, no bruits, no rebound, no guarding, unable to assess midline mass or bruit with the patient seated. EXT:  2 plus pulses throughout, trace edema, no cyanosis no clubbing SKIN:  No rashes no nodules NEURO:  Cranial nerves II through XII grossly intact, motor grossly intact throughout PSYCH:  Cognitively intact, oriented to person place and time  EKG:  EKG is ordered today. The ekg ordered today demonstrates sinus rhythm, rate 81, axis within normal limits, intervals within normal limits, moderate voltage criteria for left ventricular hypertrophy.   Recent Labs: 01/18/2019: TSH 3.047 01/23/2019: ALT 23; Hemoglobin 11.4; Platelets 198 02/09/2019: BUN 20; Creatinine, Ser 0.84; Potassium 4.8; Sodium 132    Lipid Panel    Component Value Date/Time   CHOL  03/13/2010 1816    113        ATP III CLASSIFICATION:  <200     mg/dL   Desirable  200-239  mg/dL   Borderline High  >=240    mg/dL   High          TRIG 78 03/13/2010 1816   HDL 47 03/13/2010 1816   CHOLHDL 2.4 03/13/2010 1816   VLDL 16 03/13/2010 1816   LDLCALC  03/13/2010 1816    50        Total Cholesterol/HDL:CHD Risk Coronary Heart Disease Risk Table                     Men   Women  1/2 Average Risk   3.4   3.3  Average Risk       5.0   4.4  2 X Average Risk   9.6   7.1  3 X Average Risk  23.4   11.0        Use the calculated Patient Ratio above and the CHD Risk Table to determine the patient's CHD Risk.        ATP III CLASSIFICATION (LDL):  <100     mg/dL   Optimal  100-129  mg/dL   Near or Above  Optimal  130-159  mg/dL   Borderline  [id number]  mg/dL    High  >308>190     mg/dL   Very High      Wt Readings from Last 3 Encounters:  03/18/19 138 lb (62.6 kg)  02/09/19 137 lb (62.1 kg)  01/23/19 134 lb (60.8 kg)      Other studies Reviewed: Additional studies/ records that were reviewed today include: Office records. Review of the above records demonstrates:  Please see elsewhere in the note.     ASSESSMENT AND PLAN:  LEG SWELLING:   The patient clearly had lower extremity swelling.  There is a clear description of this in the primary care office but she seems to be forgetting or minimizing the symptoms.  Regardless now she does not seem to be volume overloaded.  She does not have symptoms of left-sided failure with excessive dyspnea by her report.  I will check an echocardiogram.  I think the meds can remain as listed.  Otherwise no change in therapy.  I would consider continuing the low-dose diuretic with follow-up labs by her primary provider.  I do note that her electrolytes and TSH were normal recently.  DM: Blood sugars well controlled.  No change therapy.  COVID EDUCATION: We talked about the vaccine and she agrees she would take it as well.  Current medicines are reviewed at length with the patient today.  The patient does not have concerns regarding medicines.  The following changes have been made:  no change  Labs/ tests ordered today include: None  Orders Placed This Encounter  Procedures  . EKG 12-Lead  . ECHOCARDIOGRAM COMPLETE     Disposition:   FU with me as needed based on the results above.     Signed, Rollene RotundaJames Zyeir Dymek, MD  03/18/2019 5:06 PM    [facility]

## 2019-03-18 ENCOUNTER — Other Ambulatory Visit: Payer: Self-pay

## 2019-03-18 ENCOUNTER — Encounter: Payer: Self-pay | Admitting: Cardiology

## 2019-03-18 ENCOUNTER — Ambulatory Visit (INDEPENDENT_AMBULATORY_CARE_PROVIDER_SITE_OTHER): Payer: PPO | Admitting: Cardiology

## 2019-03-18 VITALS — BP 124/66 | HR 81 | Temp 97.0°F | Ht 62.0 in | Wt 138.0 lb

## 2019-03-18 DIAGNOSIS — Z7189 Other specified counseling: Secondary | ICD-10-CM | POA: Diagnosis not present

## 2019-03-18 DIAGNOSIS — E119 Type 2 diabetes mellitus without complications: Secondary | ICD-10-CM | POA: Diagnosis not present

## 2019-03-18 DIAGNOSIS — G3184 Mild cognitive impairment, so stated: Secondary | ICD-10-CM | POA: Diagnosis not present

## 2019-03-18 DIAGNOSIS — F418 Other specified anxiety disorders: Secondary | ICD-10-CM | POA: Insufficient documentation

## 2019-03-18 DIAGNOSIS — F4323 Adjustment disorder with mixed anxiety and depressed mood: Secondary | ICD-10-CM | POA: Diagnosis not present

## 2019-03-18 DIAGNOSIS — M7989 Other specified soft tissue disorders: Secondary | ICD-10-CM | POA: Diagnosis not present

## 2019-03-18 NOTE — Patient Instructions (Signed)
Medication Instructions:  Your physician recommends that you continue on your current medications as directed. Please refer to the Current Medication list given to you today.  *If you need a refill on your cardiac medications before your next appointment, please call your pharmacy*  Lab Work: NONE If you have labs (blood work) drawn today and your tests are completely normal, you will receive your results only by: Marland Kitchen MyChart Message (if you have MyChart) OR . A paper copy in the mail If you have any lab test that is abnormal or we need to change your treatment, we will call you to review the results.  Testing/Procedures: Your physician has requested that you have an echocardiogram. Echocardiography is a painless test that uses sound waves to create images of your heart. It provides your doctor with information about the size and shape of your heart and how well your heart's chambers and valves are working. This procedure takes approximately one hour. There are no restrictions for this procedure. LOCATION: [facility]: [place], [place], [place] [address]   Follow-Up: At [facility], you and your health needs are our priority.  As part of our continuing mission to provide you with exceptional heart care, we have created designated Provider Care Teams.  These Care Teams include your primary Cardiologist (physician) and Advanced Practice Providers (APPs -  Physician Assistants and Nurse Practitioners) who all work together to provide you with the care you need, when you need it.  Your next appointment:    AS NEEDED The format for your next appointment:   Either In Person or Virtual  Provider:   Minus Breeding, MD

## 2019-03-26 DIAGNOSIS — H35363 Drusen (degenerative) of macula, bilateral: Secondary | ICD-10-CM | POA: Diagnosis not present

## 2019-03-26 DIAGNOSIS — H35372 Puckering of macula, left eye: Secondary | ICD-10-CM | POA: Diagnosis not present

## 2019-03-26 DIAGNOSIS — E119 Type 2 diabetes mellitus without complications: Secondary | ICD-10-CM | POA: Diagnosis not present

## 2019-03-26 DIAGNOSIS — Z961 Presence of intraocular lens: Secondary | ICD-10-CM | POA: Diagnosis not present

## 2019-03-31 ENCOUNTER — Other Ambulatory Visit (HOSPITAL_COMMUNITY): Payer: PPO

## 2019-04-01 DIAGNOSIS — R609 Edema, unspecified: Secondary | ICD-10-CM | POA: Diagnosis not present

## 2019-04-01 DIAGNOSIS — K120 Recurrent oral aphthae: Secondary | ICD-10-CM | POA: Diagnosis not present

## 2019-04-01 DIAGNOSIS — R4189 Other symptoms and signs involving cognitive functions and awareness: Secondary | ICD-10-CM | POA: Diagnosis not present

## 2019-04-01 DIAGNOSIS — F418 Other specified anxiety disorders: Secondary | ICD-10-CM | POA: Diagnosis not present

## 2019-04-01 DIAGNOSIS — F320 Major depressive disorder, single episode, mild: Secondary | ICD-10-CM | POA: Diagnosis not present

## 2019-04-01 DIAGNOSIS — I10 Essential (primary) hypertension: Secondary | ICD-10-CM | POA: Diagnosis not present

## 2019-04-01 DIAGNOSIS — F321 Major depressive disorder, single episode, moderate: Secondary | ICD-10-CM | POA: Diagnosis not present

## 2019-04-01 DIAGNOSIS — F5105 Insomnia due to other mental disorder: Secondary | ICD-10-CM | POA: Diagnosis not present

## 2019-04-12 DIAGNOSIS — R1114 Bilious vomiting: Secondary | ICD-10-CM | POA: Diagnosis not present

## 2019-04-12 DIAGNOSIS — M5412 Radiculopathy, cervical region: Secondary | ICD-10-CM | POA: Diagnosis not present

## 2019-04-12 DIAGNOSIS — M4722 Other spondylosis with radiculopathy, cervical region: Secondary | ICD-10-CM | POA: Diagnosis not present

## 2019-04-12 DIAGNOSIS — M47812 Spondylosis without myelopathy or radiculopathy, cervical region: Secondary | ICD-10-CM | POA: Diagnosis not present

## 2019-04-12 DIAGNOSIS — M47892 Other spondylosis, cervical region: Secondary | ICD-10-CM | POA: Diagnosis not present

## 2019-04-12 DIAGNOSIS — Z79891 Long term (current) use of opiate analgesic: Secondary | ICD-10-CM | POA: Diagnosis not present

## 2019-04-13 ENCOUNTER — Encounter: Payer: PPO | Admitting: Psychology

## 2019-04-21 ENCOUNTER — Ambulatory Visit: Payer: PPO | Admitting: Neurology

## 2019-04-21 ENCOUNTER — Encounter: Payer: PPO | Admitting: Psychology

## 2019-04-26 ENCOUNTER — Ambulatory Visit: Payer: Self-pay | Admitting: Neurology

## 2019-04-26 DIAGNOSIS — R609 Edema, unspecified: Secondary | ICD-10-CM | POA: Diagnosis not present

## 2019-05-17 DIAGNOSIS — M25511 Pain in right shoulder: Secondary | ICD-10-CM | POA: Diagnosis not present

## 2019-05-17 DIAGNOSIS — G8929 Other chronic pain: Secondary | ICD-10-CM | POA: Diagnosis not present

## 2019-05-17 DIAGNOSIS — E119 Type 2 diabetes mellitus without complications: Secondary | ICD-10-CM | POA: Diagnosis not present

## 2019-05-17 DIAGNOSIS — M25552 Pain in left hip: Secondary | ICD-10-CM | POA: Diagnosis not present

## 2019-05-18 DIAGNOSIS — M19011 Primary osteoarthritis, right shoulder: Secondary | ICD-10-CM | POA: Diagnosis not present

## 2019-05-18 DIAGNOSIS — M1612 Unilateral primary osteoarthritis, left hip: Secondary | ICD-10-CM | POA: Diagnosis not present

## 2019-05-18 DIAGNOSIS — K5900 Constipation, unspecified: Secondary | ICD-10-CM | POA: Diagnosis not present

## 2019-05-18 DIAGNOSIS — M25552 Pain in left hip: Secondary | ICD-10-CM | POA: Diagnosis not present

## 2019-05-18 DIAGNOSIS — M25511 Pain in right shoulder: Secondary | ICD-10-CM | POA: Diagnosis not present

## 2019-05-21 ENCOUNTER — Other Ambulatory Visit: Payer: Self-pay

## 2019-05-21 ENCOUNTER — Ambulatory Visit (INDEPENDENT_AMBULATORY_CARE_PROVIDER_SITE_OTHER): Payer: PPO | Admitting: Podiatry

## 2019-05-21 DIAGNOSIS — E1151 Type 2 diabetes mellitus with diabetic peripheral angiopathy without gangrene: Secondary | ICD-10-CM

## 2019-05-21 DIAGNOSIS — R339 Retention of urine, unspecified: Secondary | ICD-10-CM | POA: Insufficient documentation

## 2019-05-21 DIAGNOSIS — E1169 Type 2 diabetes mellitus with other specified complication: Secondary | ICD-10-CM | POA: Diagnosis not present

## 2019-05-21 DIAGNOSIS — B351 Tinea unguium: Secondary | ICD-10-CM

## 2019-05-21 DIAGNOSIS — N3020 Other chronic cystitis without hematuria: Secondary | ICD-10-CM | POA: Insufficient documentation

## 2019-06-02 ENCOUNTER — Ambulatory Visit: Payer: PPO | Admitting: Orthotics

## 2019-06-02 ENCOUNTER — Other Ambulatory Visit: Payer: Self-pay

## 2019-06-04 DIAGNOSIS — E119 Type 2 diabetes mellitus without complications: Secondary | ICD-10-CM | POA: Diagnosis not present

## 2019-06-04 DIAGNOSIS — I10 Essential (primary) hypertension: Secondary | ICD-10-CM | POA: Diagnosis not present

## 2019-06-04 DIAGNOSIS — F321 Major depressive disorder, single episode, moderate: Secondary | ICD-10-CM | POA: Diagnosis not present

## 2019-06-13 NOTE — Progress Notes (Signed)
  Subjective:  Patient ID: Victoria Tyler, female    DOB: [birth year],  MRN: [id number]  Chief Complaint  Patient presents with  . Nail Problem    Pt interested in nail trim/routine foot care.    79 y.o. female presents with the above complaint. History confirmed with patient.   Objective:  Physical Exam: warm, good capillary refill, nail exam onychomycosis of the toenails, no trophic changes or ulcerative lesions. DP pulses palpable and protective sensation intact, PT pulses absent Left Foot: normal exam, no swelling, tenderness, instability; ligaments intact, full range of motion of all ankle/foot joints hammertoes  Right Foot: normal exam, no swelling, tenderness, instability; ligaments intact, full range of motion of all ankle/foot joints hammertoes  No images are attached to the encounter.  Assessment:   1. Onychomycosis of multiple toenails with type 2 diabetes mellitus and peripheral angiopathy (HCC)      Plan:  Patient was evaluated and treated and all questions answered.  Onychomycosis, Diabetes and PAD -Patient is diabetic with a qualifying condition for at risk foot care. -Educated on DM Footcare. -Would benefit from DM shoes will make appt for fabrication  Procedure: Nail Debridement Rationale: Patient meets criteria for routine foot care due to PAD Type of Debridement: manual, sharp debridement. Instrumentation: Nail nipper, rotary burr. Number of Nails: 10  Return in about 3 months (around 08/18/2019) for Diabetic Foot Care.

## 2019-06-24 DIAGNOSIS — R300 Dysuria: Secondary | ICD-10-CM | POA: Diagnosis not present

## 2019-08-04 ENCOUNTER — Ambulatory Visit: Payer: PPO | Admitting: Orthotics

## 2019-08-20 ENCOUNTER — Ambulatory Visit: Payer: PPO | Admitting: Podiatry

## 2019-08-26 DIAGNOSIS — E119 Type 2 diabetes mellitus without complications: Secondary | ICD-10-CM | POA: Diagnosis not present

## 2019-08-26 DIAGNOSIS — I10 Essential (primary) hypertension: Secondary | ICD-10-CM | POA: Diagnosis not present

## 2019-09-02 DIAGNOSIS — G479 Sleep disorder, unspecified: Secondary | ICD-10-CM | POA: Diagnosis not present

## 2019-09-02 DIAGNOSIS — M130 Polyarthritis, unspecified: Secondary | ICD-10-CM | POA: Diagnosis not present

## 2019-09-02 DIAGNOSIS — Z0000 Encounter for general adult medical examination without abnormal findings: Secondary | ICD-10-CM | POA: Diagnosis not present

## 2019-09-07 DIAGNOSIS — M542 Cervicalgia: Secondary | ICD-10-CM | POA: Diagnosis not present

## 2019-09-07 DIAGNOSIS — M7918 Myalgia, other site: Secondary | ICD-10-CM | POA: Diagnosis not present

## 2019-09-08 ENCOUNTER — Emergency Department (HOSPITAL_COMMUNITY)
Admission: EM | Admit: 2019-09-08 | Discharge: 2019-09-08 | Discharge status: Against Medical Advice | Payer: PPO | Attending: Emergency Medicine | Admitting: Emergency Medicine

## 2019-09-08 ENCOUNTER — Other Ambulatory Visit: Payer: Self-pay

## 2019-09-08 ENCOUNTER — Encounter (HOSPITAL_COMMUNITY): Payer: Self-pay | Admitting: *Deleted

## 2019-09-08 DIAGNOSIS — Z79899 Other long term (current) drug therapy: Secondary | ICD-10-CM | POA: Diagnosis not present

## 2019-09-08 DIAGNOSIS — I10 Essential (primary) hypertension: Secondary | ICD-10-CM | POA: Diagnosis not present

## 2019-09-08 DIAGNOSIS — K5901 Slow transit constipation: Secondary | ICD-10-CM | POA: Diagnosis not present

## 2019-09-08 DIAGNOSIS — Z7984 Long term (current) use of oral hypoglycemic drugs: Secondary | ICD-10-CM | POA: Diagnosis not present

## 2019-09-08 DIAGNOSIS — K5900 Constipation, unspecified: Secondary | ICD-10-CM | POA: Diagnosis not present

## 2019-09-08 DIAGNOSIS — E119 Type 2 diabetes mellitus without complications: Secondary | ICD-10-CM | POA: Insufficient documentation

## 2019-09-08 HISTORY — DX: Essential (primary) hypertension: I10

## 2019-09-08 NOTE — ED Notes (Signed)
Patient asked to go to the bathroom. Had a large Bm with soft stool, followed by a large solid stool. Patient states she feels much better and would like to be discharged.

## 2019-09-08 NOTE — ED Provider Notes (Signed)
[facility] DEPT Provider Note   CSN: [id number] Arrival date & time: 09/08/19  1448     History Chief Complaint  Patient presents with  . Constipation    Victoria Tyler is a 79 y.o. female with a past medical history of hypertension, diabetes presenting to the ED with a chief complaint of constipation.  Reports no bowel movements for the past 2 days.  States that her husband went to the pharmacy and picked up an over-the-counter laxative which she took.  She did have a bowel movement prior to my evaluation here in the ED.  She reports significant relief and resolution of her discomfort.  Denies any abdominal pain, nausea, vomiting, urinary symptoms or fever.  HPI     Past Medical History:  Diagnosis Date  . Diabetes mellitus without complication ([place])   . Hypertension     Patient Active Problem List   Diagnosis Date Noted  . Chronic cystitis 05/21/2019  . Incomplete emptying of bladder 05/21/2019  . Insomnia secondary to depression with anxiety 03/18/2019  . Leg swelling 03/17/2019  . Educated about COVID-19 virus infection 03/17/2019  . Cognitive impairment 02/23/2019  . Constipation 02/23/2019  . History of hypertension 06/24/2018  . Unintentional weight loss 11/05/2016  . Hypoglycemia 10/30/2015  . Fall 10/30/2015  . Annual physical exam 07/20/2014  . Colitis 04/24/2014  . Essential hypertension 04/24/2014  . Controlled type 2 diabetes mellitus with microalbuminuria or microproteinuria 04/24/2014  . Hypotension 04/24/2014  . High anion gap metabolic acidosis 84/13/2440  . Acute kidney injury ([place]) 04/24/2014  . Hyponatremia 04/24/2014  . Normocytic anemia 04/24/2014  . Infectious colitis 04/24/2014  . Arthritis 12/24/2010  . Gastroesophageal reflux disease without esophagitis 12/24/2010  . History of recurrent UTIs 12/24/2010  . Urinary incontinence, functional 12/24/2010    Past Surgical History:  Procedure Laterality Date  .  HERNIA REPAIR    . TONSILLECTOMY       OB History   No obstetric history on file.     Family History  Problem Relation Age of Onset  . Heart attack Father   . Dementia Neg Hx   . Alzheimer's disease Neg Hx     Social History   Tobacco Use  . Smoking status: Never Smoker  . Smokeless tobacco: Never Used  Substance Use Topics  . Alcohol use: No  . Drug use: No    Home Medications Prior to Admission medications   Medication Sig Start Date End Date Taking? Authorizing Provider  alendronate (FOSAMAX) 70 MG tablet Take 70 mg by mouth once a week.  03/24/14   [provider]  Ascorbic Acid (VITAMIN C) 1000 MG tablet Take 1,000 mg by mouth daily.    [provider]  aspirin (GOODSENSE ASPIRIN) 81 MG chewable tablet Chew 81 mg by mouth daily.    [provider]  beta carotene w/minerals (OCUVITE) tablet Take 1 tablet by mouth daily.    [provider]  Calcium Carbonate-Vitamin D 600-400 MG-UNIT tablet Take 1 tablet by mouth daily.    [provider]  Cholecalciferol (VITAMIN D3) 1000 units CAPS Take 1,000 Units by mouth daily.    [provider]  CVS ARTHRITIS PAIN RELIEF 650 MG CR tablet Take 650 mg by mouth every 8 (eight) hours as needed for pain.  12/16/18   [provider]  doxycycline (VIBRAMYCIN) 100 MG capsule Take 100 mg by mouth daily.  11/03/18   [provider]  escitalopram (LEXAPRO) 10 MG tablet  Take by mouth. 03/18/19   [provider]  furosemide (LASIX) 20 MG tablet Take by mouth. 03/08/19   [provider]  HYDROcodone-acetaminophen (NORCO/VICODIN) 5-325 MG tablet Take one tablet once or twice a day as needed for more severe pain 04/12/19   [provider]  hydrOXYzine (ATARAX/VISTARIL) 25 MG tablet Take 1 tablet (25 mg total) by mouth 2 (two) times daily as needed. 01/18/19   Zena Amos, MD  lactulose (CHRONULAC) 10 GM/15ML solution Take 10 g by mouth daily as needed  for moderate constipation.  01/12/19   [provider]  Karlene Einstein 72 MCG capsule  05/18/19   [provider]  lisinopril (PRINIVIL,ZESTRIL) 20 MG tablet Take 20 mg by mouth daily. 10/03/15   [provider]  meloxicam (MOBIC) 7.5 MG tablet Take 7.5 mg by mouth daily. 01/10/19   [provider]  metFORMIN (GLUCOPHAGE) 500 MG tablet TAKE 1 TABLET BY MOUTH DAILY FOR 7 DAYS, THEN TAKE ONE TWICE DAILY WITH MEALS 01/22/19   [provider]  omeprazole (PRILOSEC) 40 MG capsule Take 40 mg by mouth 2 (two) times daily.     [provider]  oxybutynin (DITROPAN) 5 MG tablet Take 5 mg by mouth 2 (two) times daily. 10/19/15   [provider]  potassium chloride (KLOR-CON) 10 MEQ tablet Take by mouth. 04/04/19   [provider]  predniSONE (DELTASONE) 20 MG tablet Take 20 mg by mouth daily. 02/25/19   [provider]  traZODone (DESYREL) 50 MG tablet TAKE 1 TABLET BY MOUTH EVERY DAY AT NIGHT 03/18/19   [provider]  triamcinolone (KENALOG) 0.1 % paste AAA BID-TID PRN 04/01/19   [provider]  trimethoprim (TRIMPEX) 100 MG tablet Take 100 mg by mouth daily.    [provider]    Allergies    Ciprofloxacin  Review of Systems   Review of Systems  Constitutional: Negative for appetite change, chills and fever.  HENT: Negative for ear pain, rhinorrhea, sneezing and sore throat.   Eyes: Negative for photophobia and visual disturbance.  Respiratory: Negative for cough, chest tightness, shortness of breath and wheezing.   Cardiovascular: Negative for chest pain and palpitations.  Gastrointestinal: Positive for constipation. Negative for abdominal pain, blood in stool, diarrhea, nausea and vomiting.  Genitourinary: Negative for dysuria, hematuria and urgency.  Musculoskeletal: Negative for myalgias.  Skin: Negative for rash.  Neurological: Negative for dizziness, weakness and light-headedness.    Physical  Exam Updated Vital Signs BP 138/77 (BP Location: Right Arm)   Pulse 70   Temp 98.3 F (36.8 C) (Oral)   Resp 16   SpO2 100%   Physical Exam Vitals and nursing note reviewed.  Constitutional:      General: She is not in acute distress.    Appearance: She is well-developed.  HENT:     Head: Normocephalic and atraumatic.     Nose: Nose normal.  Eyes:     General: No scleral icterus.       Left eye: No discharge.     Conjunctiva/sclera: Conjunctivae normal.  Cardiovascular:     Rate and Rhythm: Normal rate and regular rhythm.     Heart sounds: Normal heart sounds. No murmur. No friction rub. No gallop.   Pulmonary:     Effort: Pulmonary effort is normal. No respiratory distress.     Breath sounds: Normal breath sounds.  Abdominal:     General: Bowel sounds are normal. There is no distension.     Palpations:  Abdomen is soft.     Tenderness: There is no abdominal tenderness. There is no guarding.  Musculoskeletal:        General: Normal range of motion.     Cervical back: Normal range of motion and neck supple.  Skin:    General: Skin is warm and dry.     Findings: No rash.  Neurological:     Mental Status: She is alert.     Motor: No abnormal muscle tone.     Coordination: Coordination normal.     ED Results / Procedures / Treatments   Labs (all labs ordered are listed, but only abnormal results are displayed) Labs Reviewed - No data to display  EKG None  Radiology No results found.  Procedures Procedures (including critical care time)  Medications Ordered in ED Medications - No data to display  ED Course  I have reviewed the triage vital signs and the nursing notes.  Pertinent labs & imaging results that were available during my care of the patient were reviewed by me and considered in my medical decision making (see chart for details).    MDM Rules/Calculators/A&P                      79 year old female presenting to the ED with a chief complaint of  constipation.  Reports no bowel movement in 2 days.  Did take an over-the-counter laxative.  Prior to my evaluation, patient went to the bathroom here in the ED and had a large bowel movement.  She reports significant relief and resolution of her symptoms.  Abdominal exam without any tenderness.  She denies nausea, vomiting, urinary symptoms.  She is afebrile.  Suspect that this was due to constipation.  Low suspicion for obstruction.  She is requesting discharge home.  We will have her follow-up with PCP and return for worsening symptoms.  Patient is hemodynamically stable, in NAD, and able to ambulate in the ED. Evaluation does not show pathology that would require ongoing emergent intervention or inpatient treatment. I explained the diagnosis to the patient. Pain has been managed and has no complaints prior to discharge. Patient is comfortable with above plan and is stable for discharge at this time. All questions were answered prior to disposition. Strict return precautions for returning to the ED were discussed. Encouraged follow up with PCP.   An After Visit Summary was printed and given to the patient.   Portions of this note were generated with Scientist, clinical (histocompatibility and immunogenetics). Dictation errors may occur despite best attempts at proofreading.  Final Clinical Impression(s) / ED Diagnoses Final diagnoses:  Slow transit constipation    Rx / DC Orders ED Discharge Orders    None       Dietrich Pates, PA-C 09/08/19 1719    Alvira Monday, MD 09/10/19 1521

## 2019-09-08 NOTE — ED Notes (Signed)
Please contact patients husband because the patient has dementia,

## 2019-09-08 NOTE — Discharge Instructions (Addendum)
Follow-up with your primary care provider. Return to the ED if you start to experience abdominal pain, vomiting, constipation or burning with urination.

## 2019-09-08 NOTE — ED Triage Notes (Signed)
BIB EMS 2nd time due to 2 days constipation. Pt has dementia, reported by family. 160/80-80-18-CBG 124

## 2019-09-09 ENCOUNTER — Telehealth: Payer: Self-pay | Admitting: Podiatry

## 2019-09-09 NOTE — Telephone Encounter (Signed)
Left message on pts husbands number as that is main contact number to call to schedule an appt to pick up diabetic shoes (hopefully for 5.28.2021) as her authorization expires 6.4.2021.Marland KitchenMarland Kitchen

## 2019-09-17 DIAGNOSIS — M7918 Myalgia, other site: Secondary | ICD-10-CM | POA: Diagnosis not present

## 2019-09-21 DIAGNOSIS — M25511 Pain in right shoulder: Secondary | ICD-10-CM | POA: Diagnosis not present

## 2019-09-21 DIAGNOSIS — M25512 Pain in left shoulder: Secondary | ICD-10-CM | POA: Diagnosis not present

## 2019-09-23 DIAGNOSIS — R4189 Other symptoms and signs involving cognitive functions and awareness: Secondary | ICD-10-CM | POA: Diagnosis not present

## 2019-09-23 DIAGNOSIS — M25511 Pain in right shoulder: Secondary | ICD-10-CM | POA: Diagnosis not present

## 2019-12-30 DIAGNOSIS — M25511 Pain in right shoulder: Secondary | ICD-10-CM | POA: Diagnosis not present

## 2019-12-30 DIAGNOSIS — G8929 Other chronic pain: Secondary | ICD-10-CM | POA: Diagnosis not present

## 2019-12-30 DIAGNOSIS — Z7984 Long term (current) use of oral hypoglycemic drugs: Secondary | ICD-10-CM | POA: Diagnosis not present

## 2019-12-30 DIAGNOSIS — E119 Type 2 diabetes mellitus without complications: Secondary | ICD-10-CM | POA: Diagnosis not present

## 2019-12-30 DIAGNOSIS — M19012 Primary osteoarthritis, left shoulder: Secondary | ICD-10-CM | POA: Diagnosis not present

## 2019-12-30 DIAGNOSIS — I10 Essential (primary) hypertension: Secondary | ICD-10-CM | POA: Diagnosis not present

## 2019-12-30 DIAGNOSIS — F0390 Unspecified dementia without behavioral disturbance: Secondary | ICD-10-CM | POA: Diagnosis not present

## 2020-01-17 DIAGNOSIS — R399 Unspecified symptoms and signs involving the genitourinary system: Secondary | ICD-10-CM | POA: Diagnosis not present

## 2020-01-17 DIAGNOSIS — G8929 Other chronic pain: Secondary | ICD-10-CM | POA: Diagnosis not present

## 2020-01-17 DIAGNOSIS — M19019 Primary osteoarthritis, unspecified shoulder: Secondary | ICD-10-CM | POA: Diagnosis not present

## 2020-01-17 DIAGNOSIS — N3281 Overactive bladder: Secondary | ICD-10-CM | POA: Diagnosis not present

## 2020-01-17 DIAGNOSIS — M25511 Pain in right shoulder: Secondary | ICD-10-CM | POA: Diagnosis not present

## 2020-01-21 DIAGNOSIS — G8929 Other chronic pain: Secondary | ICD-10-CM | POA: Diagnosis not present

## 2020-01-21 DIAGNOSIS — E119 Type 2 diabetes mellitus without complications: Secondary | ICD-10-CM | POA: Diagnosis not present

## 2020-01-21 DIAGNOSIS — Z7982 Long term (current) use of aspirin: Secondary | ICD-10-CM | POA: Diagnosis not present

## 2020-01-21 DIAGNOSIS — N3281 Overactive bladder: Secondary | ICD-10-CM | POA: Diagnosis not present

## 2020-01-21 DIAGNOSIS — Z7984 Long term (current) use of oral hypoglycemic drugs: Secondary | ICD-10-CM | POA: Diagnosis not present

## 2020-01-21 DIAGNOSIS — Z9181 History of falling: Secondary | ICD-10-CM | POA: Diagnosis not present

## 2020-01-21 DIAGNOSIS — M19011 Primary osteoarthritis, right shoulder: Secondary | ICD-10-CM | POA: Diagnosis not present

## 2020-01-31 DIAGNOSIS — N3281 Overactive bladder: Secondary | ICD-10-CM | POA: Diagnosis not present

## 2020-01-31 DIAGNOSIS — E119 Type 2 diabetes mellitus without complications: Secondary | ICD-10-CM | POA: Diagnosis not present

## 2020-01-31 DIAGNOSIS — Z7984 Long term (current) use of oral hypoglycemic drugs: Secondary | ICD-10-CM | POA: Diagnosis not present

## 2020-01-31 DIAGNOSIS — M19011 Primary osteoarthritis, right shoulder: Secondary | ICD-10-CM | POA: Diagnosis not present

## 2020-01-31 DIAGNOSIS — Z7982 Long term (current) use of aspirin: Secondary | ICD-10-CM | POA: Diagnosis not present

## 2020-01-31 DIAGNOSIS — G8929 Other chronic pain: Secondary | ICD-10-CM | POA: Diagnosis not present

## 2020-01-31 DIAGNOSIS — Z9181 History of falling: Secondary | ICD-10-CM | POA: Diagnosis not present

## 2020-02-09 DIAGNOSIS — M19011 Primary osteoarthritis, right shoulder: Secondary | ICD-10-CM | POA: Diagnosis not present

## 2020-02-09 DIAGNOSIS — E119 Type 2 diabetes mellitus without complications: Secondary | ICD-10-CM | POA: Diagnosis not present

## 2020-02-09 DIAGNOSIS — Z9181 History of falling: Secondary | ICD-10-CM | POA: Diagnosis not present

## 2020-02-09 DIAGNOSIS — G8929 Other chronic pain: Secondary | ICD-10-CM | POA: Diagnosis not present

## 2020-02-09 DIAGNOSIS — Z7982 Long term (current) use of aspirin: Secondary | ICD-10-CM | POA: Diagnosis not present

## 2020-02-09 DIAGNOSIS — N3281 Overactive bladder: Secondary | ICD-10-CM | POA: Diagnosis not present

## 2020-02-09 DIAGNOSIS — Z7984 Long term (current) use of oral hypoglycemic drugs: Secondary | ICD-10-CM | POA: Diagnosis not present

## 2020-02-15 DIAGNOSIS — Z7982 Long term (current) use of aspirin: Secondary | ICD-10-CM | POA: Diagnosis not present

## 2020-02-15 DIAGNOSIS — Z9181 History of falling: Secondary | ICD-10-CM | POA: Diagnosis not present

## 2020-02-15 DIAGNOSIS — G8929 Other chronic pain: Secondary | ICD-10-CM | POA: Diagnosis not present

## 2020-02-15 DIAGNOSIS — E119 Type 2 diabetes mellitus without complications: Secondary | ICD-10-CM | POA: Diagnosis not present

## 2020-02-15 DIAGNOSIS — N3281 Overactive bladder: Secondary | ICD-10-CM | POA: Diagnosis not present

## 2020-02-15 DIAGNOSIS — Z7984 Long term (current) use of oral hypoglycemic drugs: Secondary | ICD-10-CM | POA: Diagnosis not present

## 2020-02-15 DIAGNOSIS — M19011 Primary osteoarthritis, right shoulder: Secondary | ICD-10-CM | POA: Diagnosis not present

## 2020-02-25 ENCOUNTER — Other Ambulatory Visit: Payer: Self-pay | Admitting: Family Medicine

## 2020-02-25 DIAGNOSIS — M25511 Pain in right shoulder: Secondary | ICD-10-CM

## 2020-02-27 ENCOUNTER — Ambulatory Visit
Admission: RE | Admit: 2020-02-27 | Discharge: 2020-02-27 | Disposition: A | Discharge status: Home / Self Care | Payer: PPO | Source: Ambulatory Visit | Attending: Family Medicine | Admitting: Family Medicine

## 2020-02-27 DIAGNOSIS — G8929 Other chronic pain: Secondary | ICD-10-CM

## 2020-02-27 DIAGNOSIS — M25511 Pain in right shoulder: Secondary | ICD-10-CM | POA: Diagnosis not present

## 2020-02-27 IMAGING — MR MR SHOULDER*R* W/O CM
5 series · 36 of 40 positions shown · non-contrast
Comparison: X-ray [DATE]

CLINICAL DATA: Right shoulder pain for 7 months.  No known injury.

EXAM:
MRI OF THE RIGHT SHOULDER WITHOUT CONTRAST
TECHNIQUE: Multiplanar, multisequence MR imaging of the shoulder was performed.
No intravenous contrast was administered.

[Series 3: PD fat-sat · axial · 4.0mm · 0.55mm/px · z∈[-11,+82]mm · 8 of 22 slices shown]
[im 1/22]
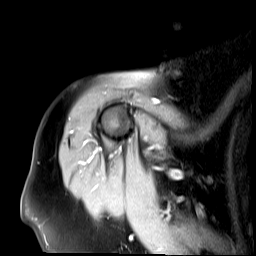
[im 4/22]
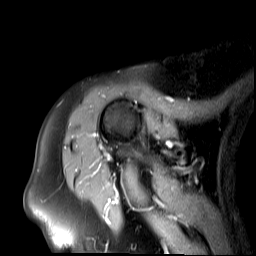
[im 7/22]
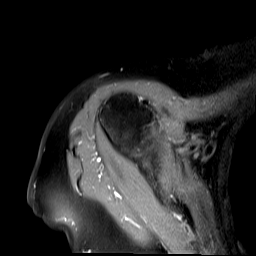
[im 10/22]
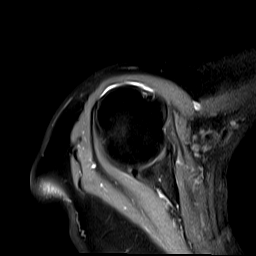
[im 13/22]
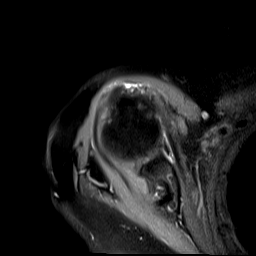
[im 16/22]
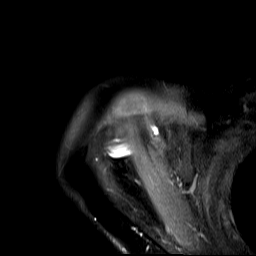
[im 19/22]
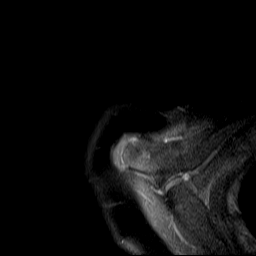
[im 22/22]
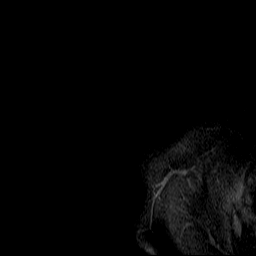

[Series 5: T1 · coronal · 4.0mm · 0.27mm/px · 4 of 20 slices shown]
[im 1/20]
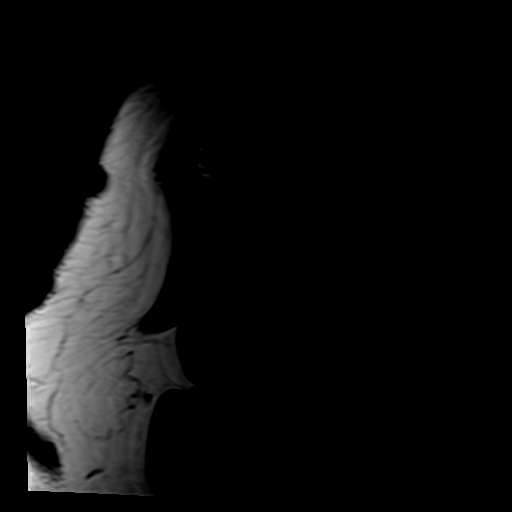
[im 3/20]
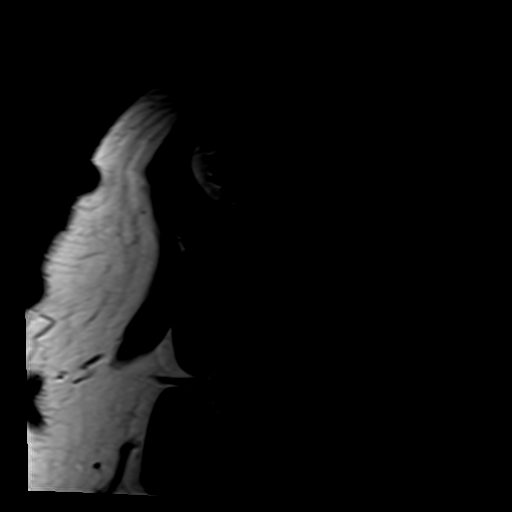
[im 6/20]
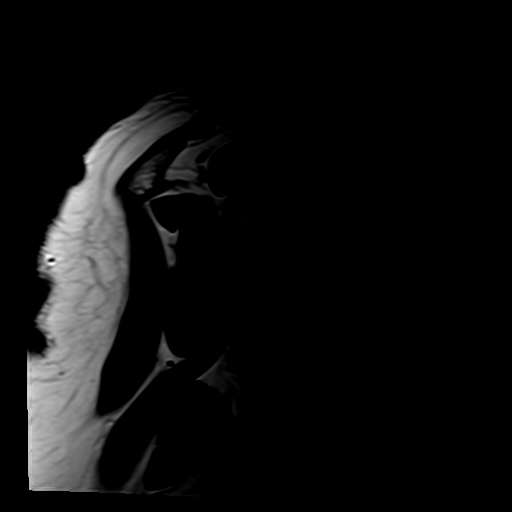
[im 9/20]
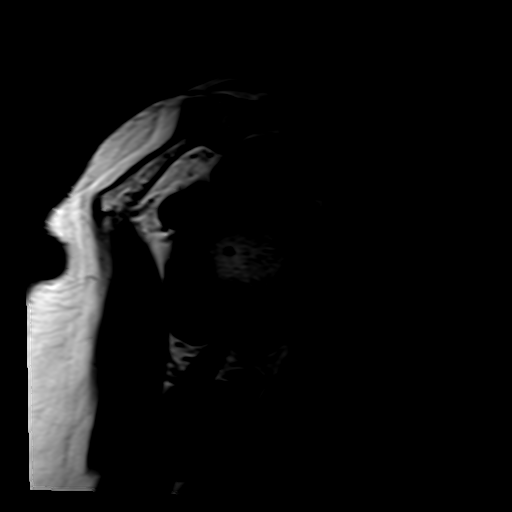

[Series 6: PD · oblique · 4.0mm · 0.27mm/px · 8 of 22 slices shown]
[im 1/22]
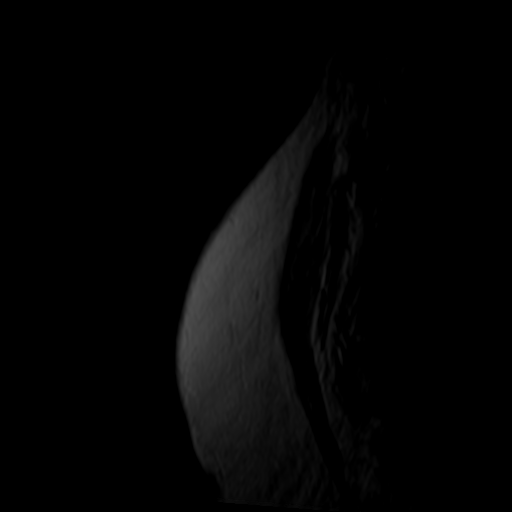
[im 4/22]
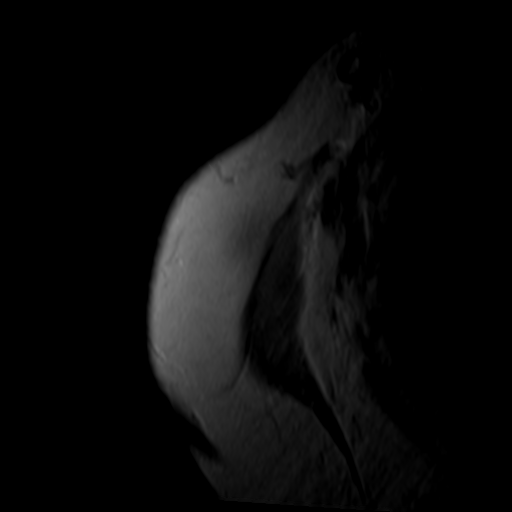
[im 7/22]
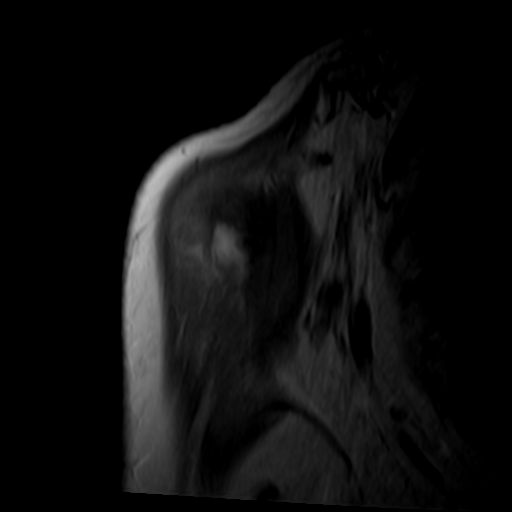
[im 10/22]
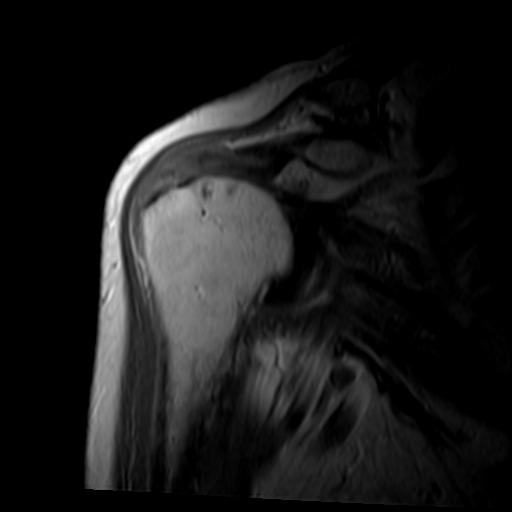
[im 13/22]
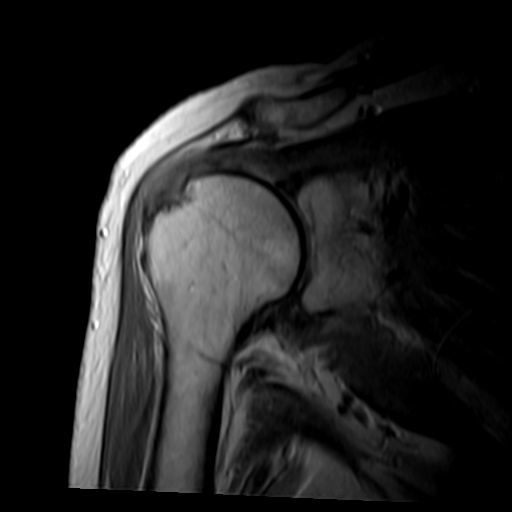
[im 16/22]
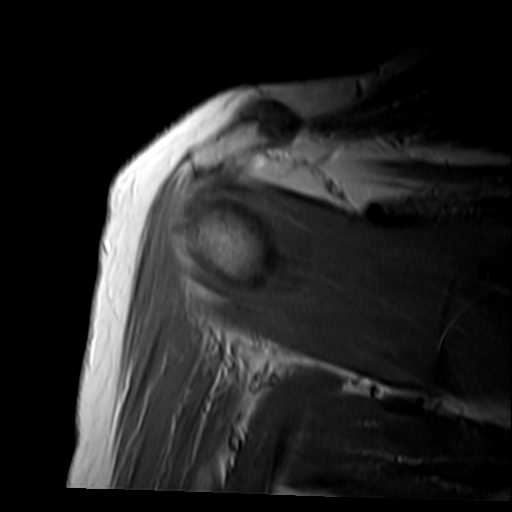
[im 19/22]
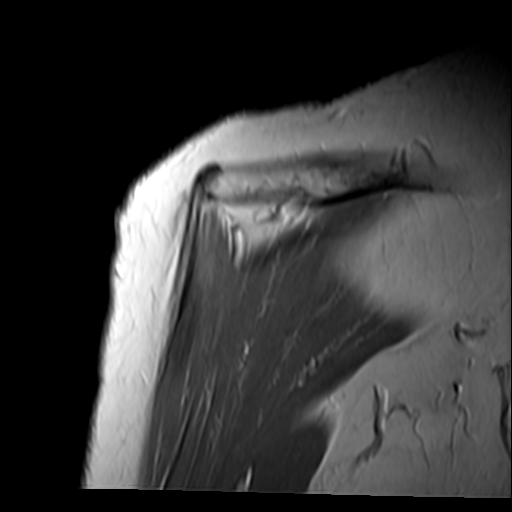
[im 22/22]
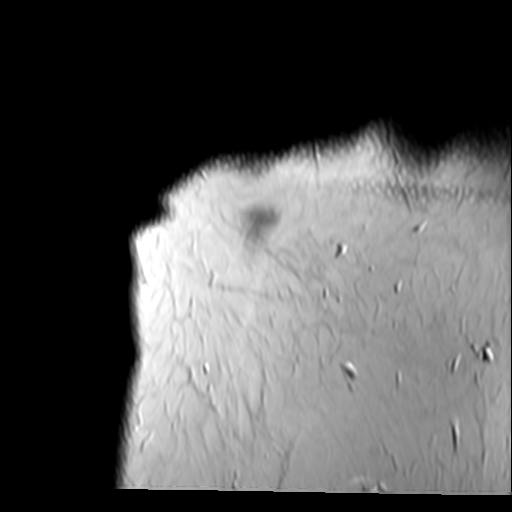

[Series 7: T2 fat-sat · coronal · 4.0mm · 0.55mm/px · 8 of 20 slices shown (1 of 2)]
[im 1/20]
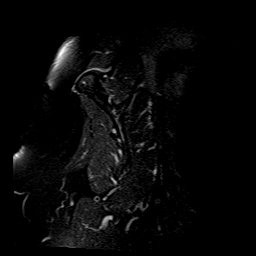
[im 3/20]
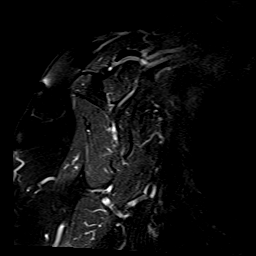
[im 6/20]
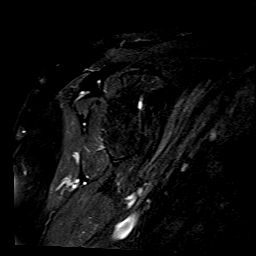
[im 9/20]
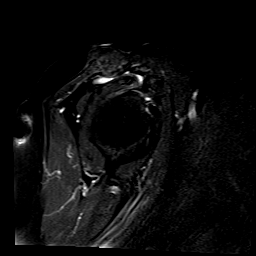
[im 11/20]
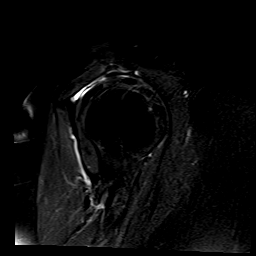
[im 14/20]
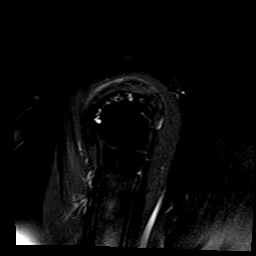
[im 17/20]
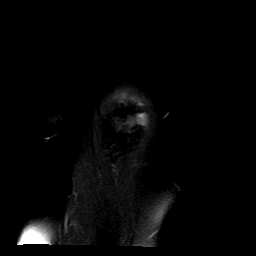
[im 20/20]
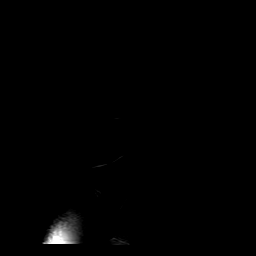

[Series 8: T2 fat-sat · oblique · 4.0mm · 0.55mm/px · 8 of 22 slices shown (2 of 2)]
[im 1/22]
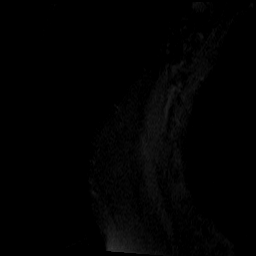
[im 4/22]
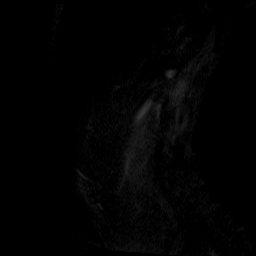
[im 7/22]
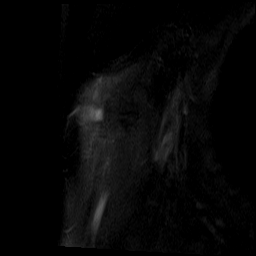
[im 10/22]
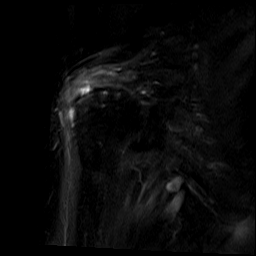
[im 13/22]
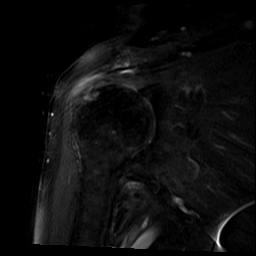
[im 16/22]
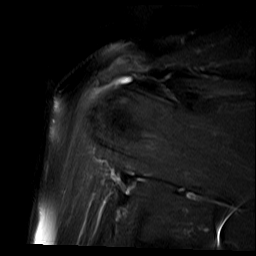
[im 19/22]
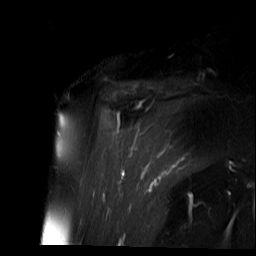
[im 22/22]
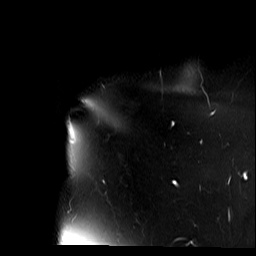

[36 of 40 positions shown; findings below may reference images not displayed]

FINDINGS: Technical note: Motion degraded examination.

Rotator cuff: Full-thickness tear of the anterior supraspinatus
tendon distally measuring 8 mm in AP dimension with up to 8 mm of
tendinous retraction (series 8, images 13-14). Background of
moderate supraspinatus tendinosis. Infraspinatus, subscapularis, and
teres minor tendons intact.

Muscles: Preserved bulk and signal intensity of the rotator cuff
musculature without edema, atrophy, or fatty infiltration.

Biceps long head:  Grossly intact.

Acromioclavicular Joint: Mild-moderate arthropathy of the AC joint.
Small volume subacromial subdeltoid bursal fluid.

Glenohumeral Joint: Mild diffuse chondral thinning. No glenohumeral
joint effusion.

Labrum:  Poorly evaluated.

Bones: Reactive subcortical marrow signal changes at the greater
tuberosity. No acute fracture. No dislocation. No suspicious bone
lesion.

Other: None.
IMPRESSION: 1. Full-thickness minimally retracted tear of the anterior
supraspinatus tendon measuring 8 mm in AP dimension with background
of moderate supraspinatus tendinosis.
2. Mild-to-moderate AC joint and glenohumeral osteoarthritis.

## 2020-03-06 DIAGNOSIS — Z9181 History of falling: Secondary | ICD-10-CM | POA: Diagnosis not present

## 2020-03-06 DIAGNOSIS — E119 Type 2 diabetes mellitus without complications: Secondary | ICD-10-CM | POA: Diagnosis not present

## 2020-03-06 DIAGNOSIS — G8929 Other chronic pain: Secondary | ICD-10-CM | POA: Diagnosis not present

## 2020-03-06 DIAGNOSIS — Z7984 Long term (current) use of oral hypoglycemic drugs: Secondary | ICD-10-CM | POA: Diagnosis not present

## 2020-03-06 DIAGNOSIS — M19011 Primary osteoarthritis, right shoulder: Secondary | ICD-10-CM | POA: Diagnosis not present

## 2020-03-06 DIAGNOSIS — Z7982 Long term (current) use of aspirin: Secondary | ICD-10-CM | POA: Diagnosis not present

## 2020-03-06 DIAGNOSIS — N3281 Overactive bladder: Secondary | ICD-10-CM | POA: Diagnosis not present

## 2020-03-07 DIAGNOSIS — M25511 Pain in right shoulder: Secondary | ICD-10-CM | POA: Diagnosis not present

## 2020-03-13 DIAGNOSIS — M75111 Incomplete rotator cuff tear or rupture of right shoulder, not specified as traumatic: Secondary | ICD-10-CM | POA: Diagnosis not present

## 2020-03-13 DIAGNOSIS — M25611 Stiffness of right shoulder, not elsewhere classified: Secondary | ICD-10-CM | POA: Diagnosis not present

## 2020-03-13 DIAGNOSIS — M6281 Muscle weakness (generalized): Secondary | ICD-10-CM | POA: Diagnosis not present

## 2020-03-13 DIAGNOSIS — M19011 Primary osteoarthritis, right shoulder: Secondary | ICD-10-CM | POA: Diagnosis not present

## 2020-03-17 DIAGNOSIS — N3281 Overactive bladder: Secondary | ICD-10-CM | POA: Diagnosis not present

## 2020-03-17 DIAGNOSIS — Z9181 History of falling: Secondary | ICD-10-CM | POA: Diagnosis not present

## 2020-03-17 DIAGNOSIS — E119 Type 2 diabetes mellitus without complications: Secondary | ICD-10-CM | POA: Diagnosis not present

## 2020-03-17 DIAGNOSIS — Z7984 Long term (current) use of oral hypoglycemic drugs: Secondary | ICD-10-CM | POA: Diagnosis not present

## 2020-03-17 DIAGNOSIS — G8929 Other chronic pain: Secondary | ICD-10-CM | POA: Diagnosis not present

## 2020-03-17 DIAGNOSIS — M19011 Primary osteoarthritis, right shoulder: Secondary | ICD-10-CM | POA: Diagnosis not present

## 2020-03-17 DIAGNOSIS — Z7982 Long term (current) use of aspirin: Secondary | ICD-10-CM | POA: Diagnosis not present

## 2020-03-29 DIAGNOSIS — M75111 Incomplete rotator cuff tear or rupture of right shoulder, not specified as traumatic: Secondary | ICD-10-CM | POA: Diagnosis not present

## 2020-03-29 DIAGNOSIS — M25611 Stiffness of right shoulder, not elsewhere classified: Secondary | ICD-10-CM | POA: Diagnosis not present

## 2020-03-29 DIAGNOSIS — M6281 Muscle weakness (generalized): Secondary | ICD-10-CM | POA: Diagnosis not present

## 2020-03-29 DIAGNOSIS — M19011 Primary osteoarthritis, right shoulder: Secondary | ICD-10-CM | POA: Diagnosis not present

## 2020-03-30 DIAGNOSIS — I10 Essential (primary) hypertension: Secondary | ICD-10-CM | POA: Diagnosis not present

## 2020-03-30 DIAGNOSIS — E119 Type 2 diabetes mellitus without complications: Secondary | ICD-10-CM | POA: Diagnosis not present

## 2020-04-03 DIAGNOSIS — H35372 Puckering of macula, left eye: Secondary | ICD-10-CM | POA: Diagnosis not present

## 2020-04-03 DIAGNOSIS — E119 Type 2 diabetes mellitus without complications: Secondary | ICD-10-CM | POA: Diagnosis not present

## 2020-04-03 DIAGNOSIS — H524 Presbyopia: Secondary | ICD-10-CM | POA: Diagnosis not present

## 2020-04-04 DIAGNOSIS — M19011 Primary osteoarthritis, right shoulder: Secondary | ICD-10-CM | POA: Diagnosis not present

## 2020-05-04 DIAGNOSIS — R399 Unspecified symptoms and signs involving the genitourinary system: Secondary | ICD-10-CM | POA: Diagnosis not present

## 2020-06-13 DIAGNOSIS — H66001 Acute suppurative otitis media without spontaneous rupture of ear drum, right ear: Secondary | ICD-10-CM | POA: Diagnosis not present

## 2020-06-13 DIAGNOSIS — H6122 Impacted cerumen, left ear: Secondary | ICD-10-CM | POA: Diagnosis not present

## 2020-07-13 DIAGNOSIS — E119 Type 2 diabetes mellitus without complications: Secondary | ICD-10-CM | POA: Diagnosis not present

## 2020-07-13 DIAGNOSIS — I10 Essential (primary) hypertension: Secondary | ICD-10-CM | POA: Diagnosis not present

## 2020-07-13 DIAGNOSIS — F0390 Unspecified dementia without behavioral disturbance: Secondary | ICD-10-CM | POA: Diagnosis not present

## 2020-09-08 DIAGNOSIS — L299 Pruritus, unspecified: Secondary | ICD-10-CM | POA: Diagnosis not present

## 2020-10-18 DIAGNOSIS — B86 Scabies: Secondary | ICD-10-CM | POA: Diagnosis not present

## 2020-10-31 DIAGNOSIS — E119 Type 2 diabetes mellitus without complications: Secondary | ICD-10-CM | POA: Diagnosis not present

## 2020-10-31 DIAGNOSIS — M810 Age-related osteoporosis without current pathological fracture: Secondary | ICD-10-CM | POA: Diagnosis not present

## 2020-10-31 DIAGNOSIS — F0390 Unspecified dementia without behavioral disturbance: Secondary | ICD-10-CM | POA: Diagnosis not present

## 2020-10-31 DIAGNOSIS — I10 Essential (primary) hypertension: Secondary | ICD-10-CM | POA: Diagnosis not present

## 2020-11-29 DIAGNOSIS — M1712 Unilateral primary osteoarthritis, left knee: Secondary | ICD-10-CM | POA: Diagnosis not present

## 2020-12-27 DIAGNOSIS — M1712 Unilateral primary osteoarthritis, left knee: Secondary | ICD-10-CM | POA: Diagnosis not present

## 2021-02-14 ENCOUNTER — Encounter (HOSPITAL_COMMUNITY): Payer: Self-pay | Admitting: Emergency Medicine

## 2021-02-14 ENCOUNTER — Other Ambulatory Visit: Payer: Self-pay

## 2021-02-14 ENCOUNTER — Emergency Department (HOSPITAL_COMMUNITY): Payer: HMO

## 2021-02-14 ENCOUNTER — Emergency Department (HOSPITAL_COMMUNITY)
Admission: EM | Admit: 2021-02-14 | Discharge: 2021-02-15 | Disposition: A | Discharge status: Home / Self Care | Payer: HMO | Attending: Emergency Medicine | Admitting: Emergency Medicine

## 2021-02-14 DIAGNOSIS — E119 Type 2 diabetes mellitus without complications: Secondary | ICD-10-CM | POA: Insufficient documentation

## 2021-02-14 DIAGNOSIS — I10 Essential (primary) hypertension: Secondary | ICD-10-CM | POA: Insufficient documentation

## 2021-02-14 DIAGNOSIS — Z79899 Other long term (current) drug therapy: Secondary | ICD-10-CM | POA: Diagnosis not present

## 2021-02-14 DIAGNOSIS — R42 Dizziness and giddiness: Secondary | ICD-10-CM | POA: Diagnosis not present

## 2021-02-14 DIAGNOSIS — Z7982 Long term (current) use of aspirin: Secondary | ICD-10-CM | POA: Diagnosis not present

## 2021-02-14 DIAGNOSIS — Z7984 Long term (current) use of oral hypoglycemic drugs: Secondary | ICD-10-CM | POA: Diagnosis not present

## 2021-02-14 DIAGNOSIS — R000 Tachycardia, unspecified: Secondary | ICD-10-CM | POA: Diagnosis not present

## 2021-02-14 DIAGNOSIS — R55 Syncope and collapse: Secondary | ICD-10-CM

## 2021-02-14 DIAGNOSIS — I499 Cardiac arrhythmia, unspecified: Secondary | ICD-10-CM | POA: Diagnosis not present

## 2021-02-14 LAB — I-STAT CHEM 8, ED
BUN: 28 mg/dL — ABNORMAL HIGH (ref 8–23)
Calcium, Ion: 1.14 mmol/L — ABNORMAL LOW (ref 1.15–1.40)
Chloride: 100 mmol/L (ref 98–111)
Creatinine, Ser: 0.8 mg/dL (ref 0.44–1.00)
Glucose, Bld: 212 mg/dL — ABNORMAL HIGH (ref 70–99)
HCT: 35 % — ABNORMAL LOW (ref 36.0–46.0)
Hemoglobin: 11.9 g/dL — ABNORMAL LOW (ref 12.0–15.0)
Potassium: 3.7 mmol/L (ref 3.5–5.1)
Sodium: 135 mmol/L (ref 135–145)
TCO2: 23 mmol/L (ref 22–32)

## 2021-02-14 LAB — CBC WITH DIFFERENTIAL/PLATELET
Abs Immature Granulocytes: 0.03 10*3/uL (ref 0.00–0.07)
Basophils Absolute: 0.1 10*3/uL (ref 0.0–0.1)
Basophils Relative: 1 %
Eosinophils Absolute: 0.1 10*3/uL (ref 0.0–0.5)
Eosinophils Relative: 1 %
HCT: 33.1 % — ABNORMAL LOW (ref 36.0–46.0)
Hemoglobin: 11.4 g/dL — ABNORMAL LOW (ref 12.0–15.0)
Immature Granulocytes: 0 %
Lymphocytes Relative: 31 %
Lymphs Abs: 2.2 10*3/uL (ref 0.7–4.0)
MCH: 33.6 pg (ref 26.0–34.0)
MCHC: 34.4 g/dL (ref 30.0–36.0)
MCV: 97.6 fL (ref 80.0–100.0)
Monocytes Absolute: 1.2 10*3/uL — ABNORMAL HIGH (ref 0.1–1.0)
Monocytes Relative: 17 %
Neutro Abs: 3.6 10*3/uL (ref 1.7–7.7)
Neutrophils Relative %: 50 %
Platelets: 241 10*3/uL (ref 150–400)
RBC: 3.39 MIL/uL — ABNORMAL LOW (ref 3.87–5.11)
RDW: 14.3 % (ref 11.5–15.5)
WBC: 7.2 10*3/uL (ref 4.0–10.5)
nRBC: 0 % (ref 0.0–0.2)

## 2021-02-14 IMAGING — DX DG CHEST 1V PORT
1 series · 1 of 1 positions shown · non-contrast
Comparison: Chest x-ray [DATE].

CLINICAL DATA: Dizziness.

EXAM:
PORTABLE CHEST 1 VIEW

[chest ap]
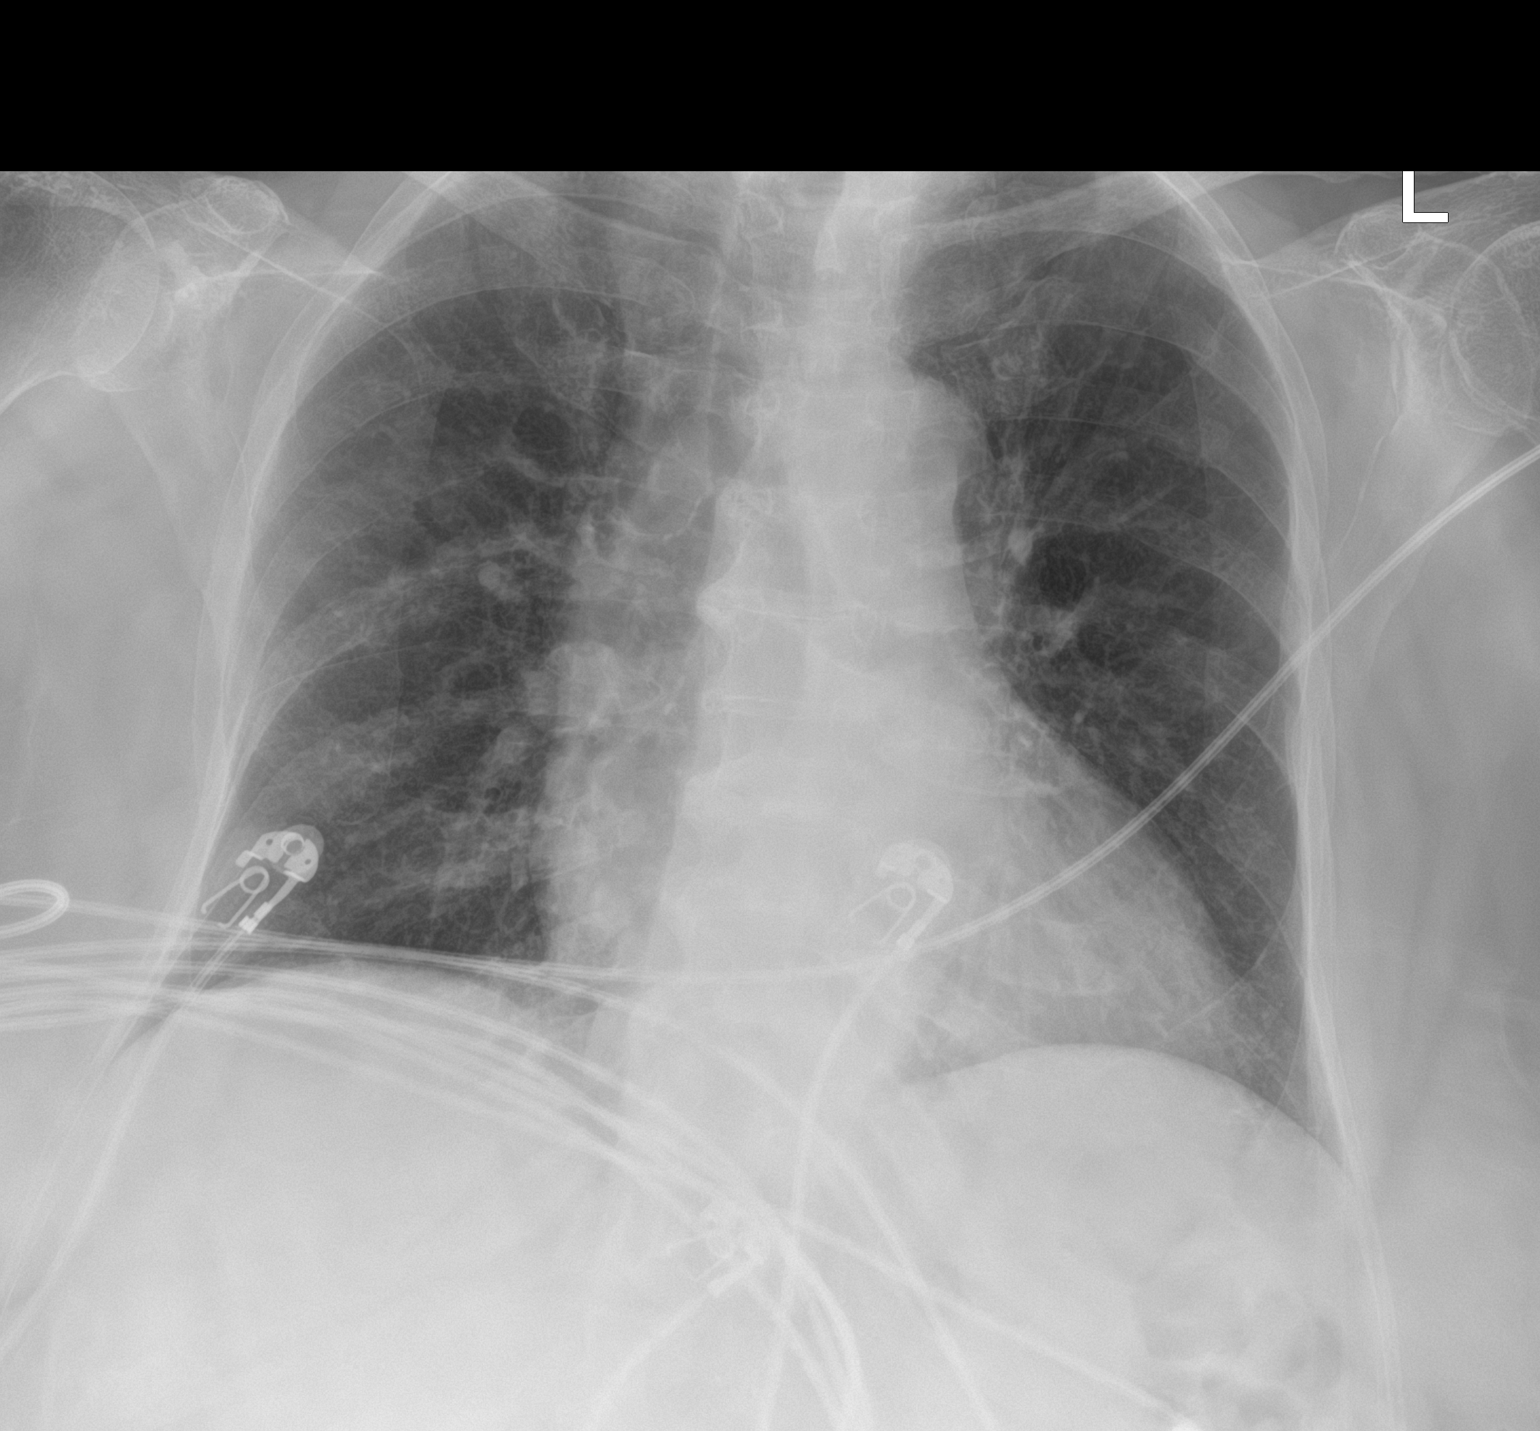

[1 of 1 positions shown; findings below may reference images not displayed]

FINDINGS: The heart size and mediastinal contours are within normal limits.
Both lungs are clear. The visualized skeletal structures are
unremarkable.
IMPRESSION: No active disease.

## 2021-02-14 NOTE — ED Triage Notes (Signed)
Per EMS, Pt from home w/ spouse, was getting out of bed to use the bathroom and began to feel dizzy. Pt found in sinus arrhthymias and hypertensive, no hx of either.  No other complaints at this time.  Pt states she felt "fine" before bed.    180/80 HR 80 - 120 RR 18 CBG 228 20G L hand - no meds or fluids

## 2021-02-15 ENCOUNTER — Encounter (HOSPITAL_COMMUNITY): Payer: Self-pay | Admitting: Emergency Medicine

## 2021-02-15 ENCOUNTER — Emergency Department (HOSPITAL_COMMUNITY): Payer: HMO

## 2021-02-15 DIAGNOSIS — R42 Dizziness and giddiness: Secondary | ICD-10-CM | POA: Diagnosis not present

## 2021-02-15 LAB — URINALYSIS, ROUTINE W REFLEX MICROSCOPIC
Bacteria, UA: NONE SEEN
Bilirubin Urine: NEGATIVE
Glucose, UA: 50 mg/dL — AB
Hgb urine dipstick: NEGATIVE
Ketones, ur: NEGATIVE mg/dL
Nitrite: NEGATIVE
Protein, ur: NEGATIVE mg/dL
Specific Gravity, Urine: 1.01 (ref 1.005–1.030)
pH: 5 (ref 5.0–8.0)

## 2021-02-15 LAB — TROPONIN I (HIGH SENSITIVITY)
Troponin I (High Sensitivity): 9 ng/L (ref <18)
Troponin I (High Sensitivity): 10 ng/L (ref <18)

## 2021-02-15 IMAGING — CT CT HEAD W/O CM
4 series · 16 of 47 positions shown, 18 images · non-contrast
Comparison: None.

CLINICAL DATA: Dizziness, fall

EXAM:
CT HEAD WITHOUT CONTRAST
TECHNIQUE: Contiguous axial images were obtained from the base of the skull
through the vertex without intravenous contrast.

[Series 3: head wo · axial · 0.37mm/px · z∈[-230,-116]mm · 7 of 31 slices shown, 9 images]
[im 4/31  brain]
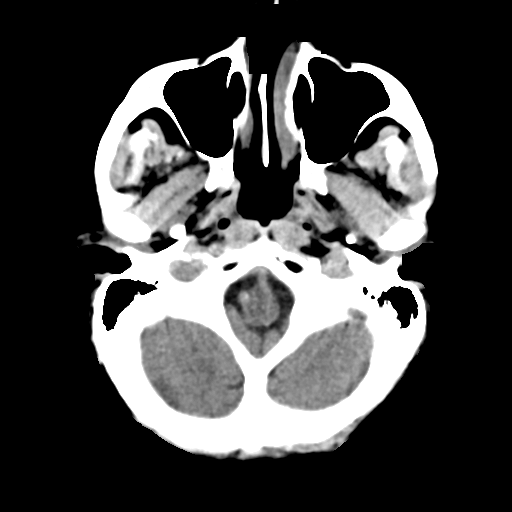
[im 4/31  bone]
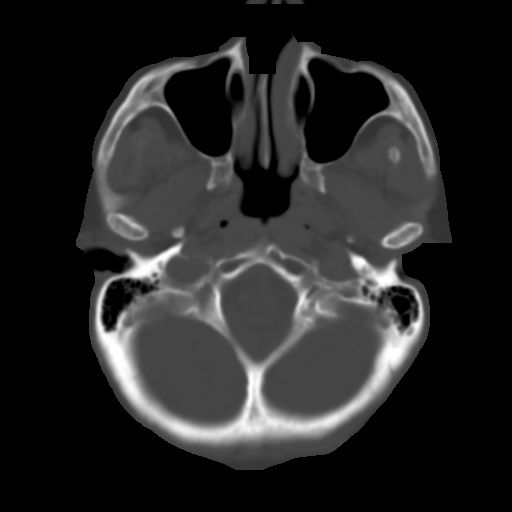
[im 8/31  brain]
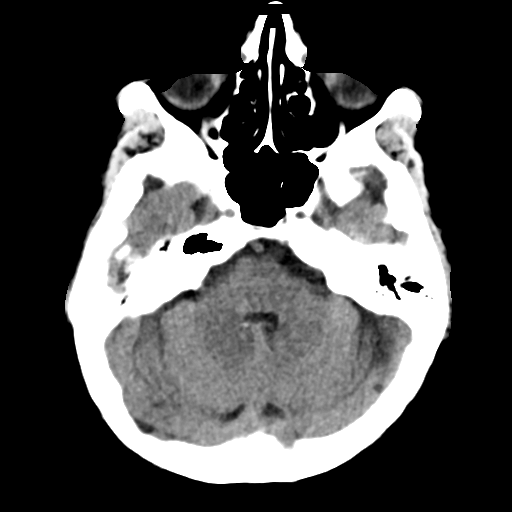
[im 12/31  brain]
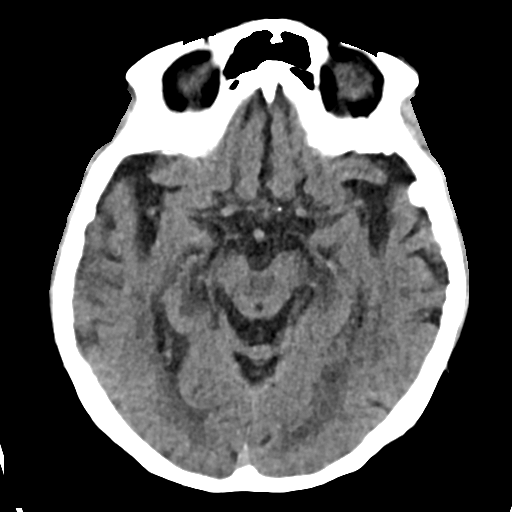
[im 16/31  brain]
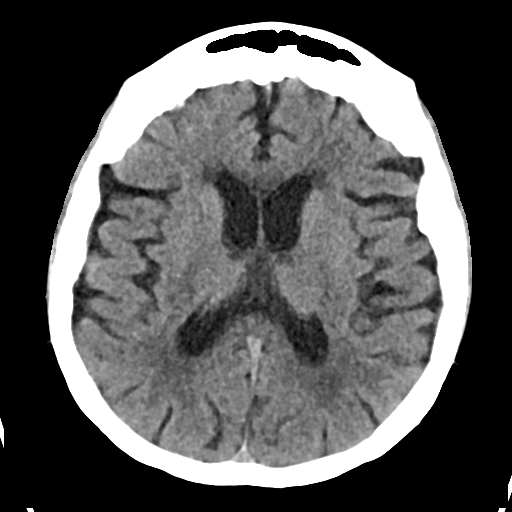
[im 19/31  brain]
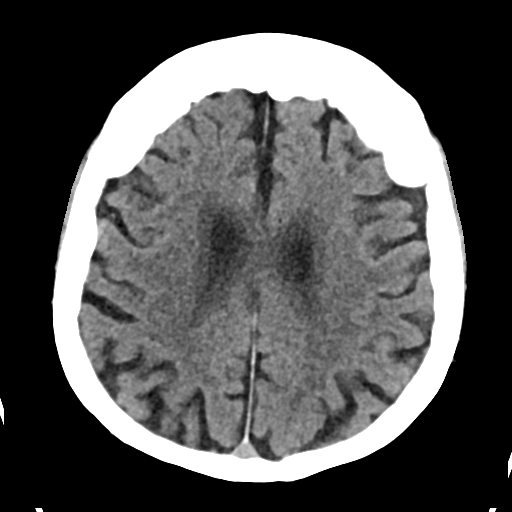
[im 19/31  bone]
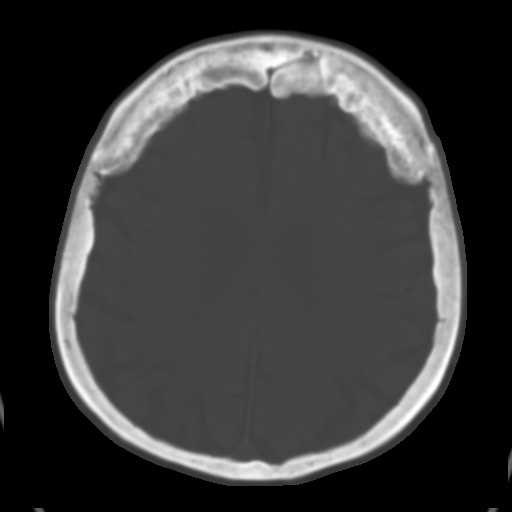
[im 23/31  brain]
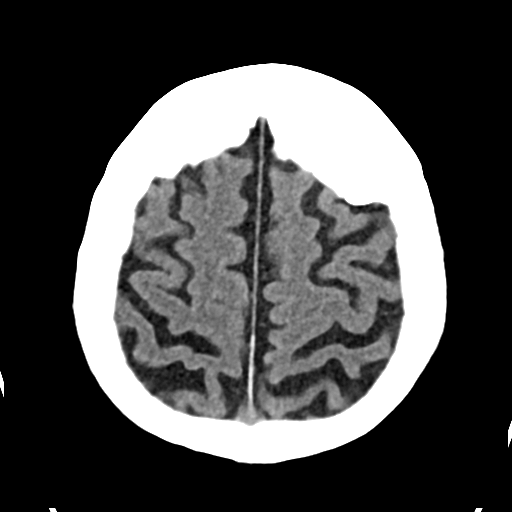
[im 27/31  brain]
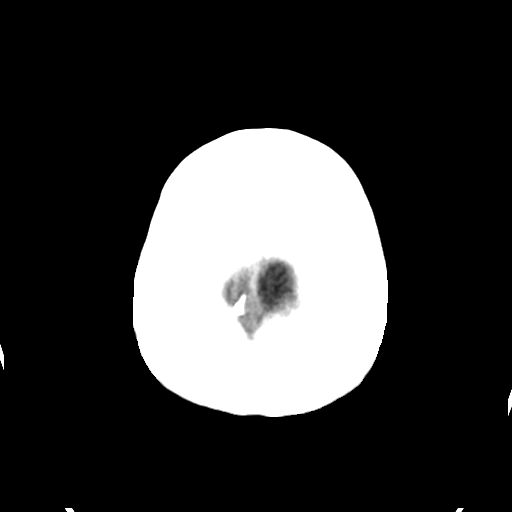

[Series 4: head bone · axial · 0.37mm/px · z∈[-232,-202]mm · 3 of 76 slices shown]
[im 8/76  bone]
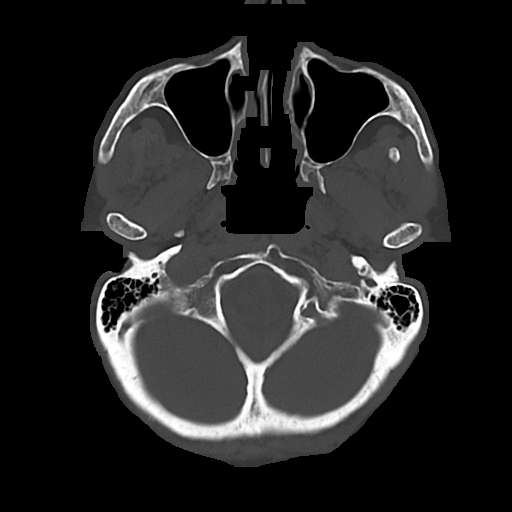
[im 16/76  bone]
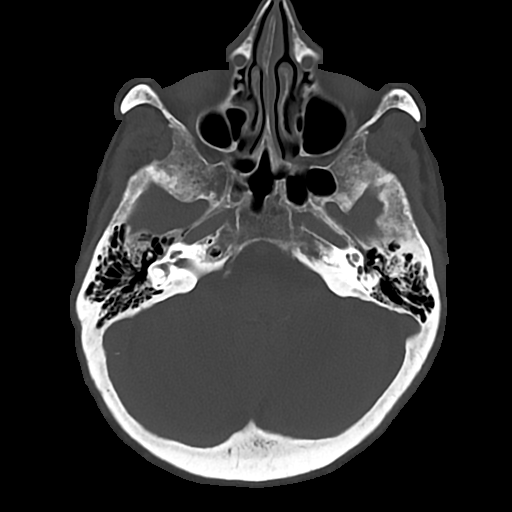
[im 23/76  bone]
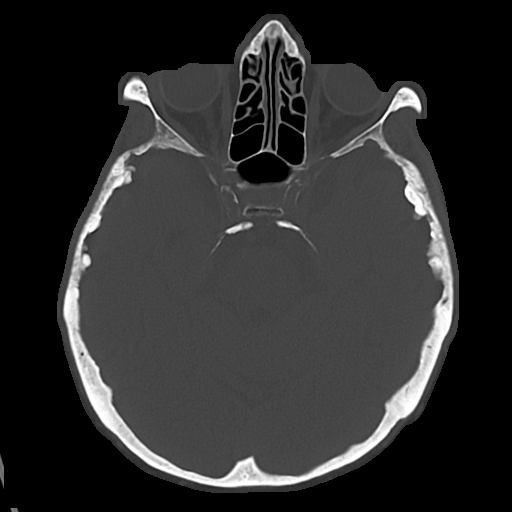

[Series 5: cor soft · coronal · 0.30mm/px · 3 of 64 slices shown]
[im 22/64  brain]
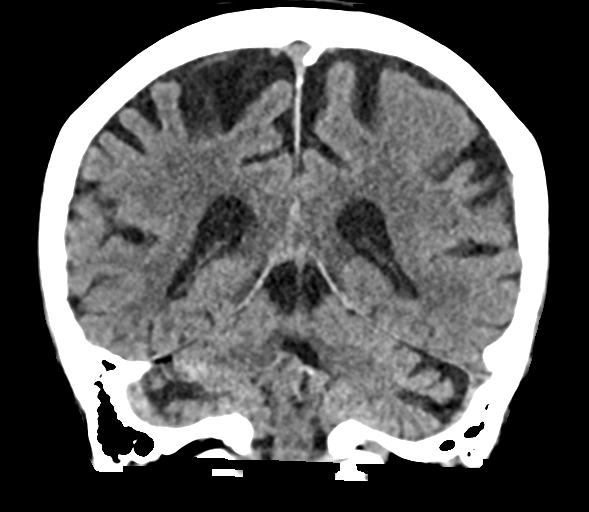
[im 29/64  brain]
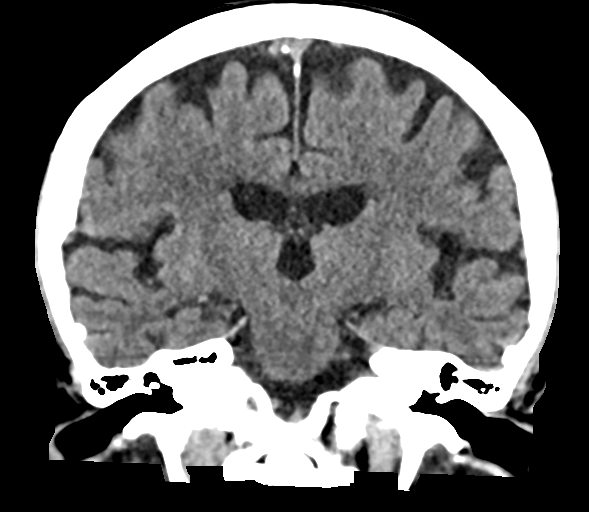
[im 36/64  brain]
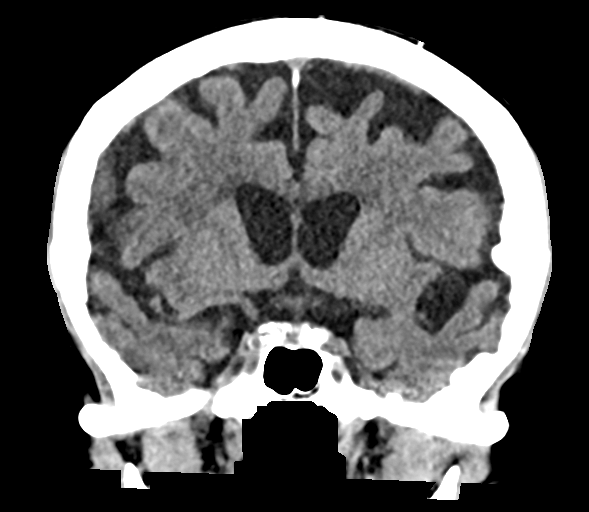

[Series 6: sag soft · sagittal · 0.30mm/px · 3 of 59 slices shown]
[im 20/59  brain]
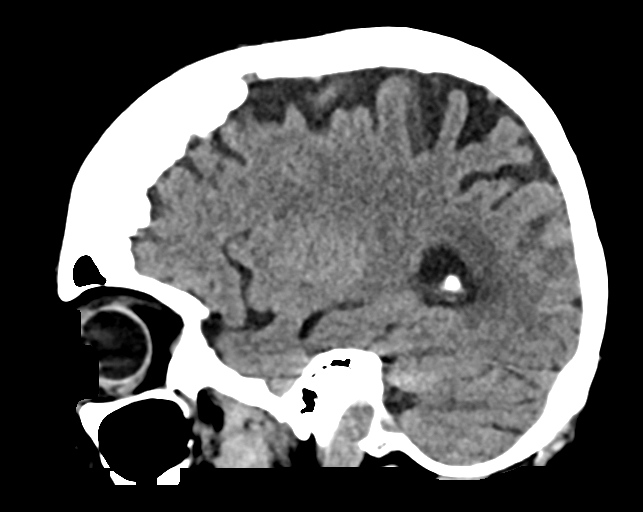
[im 30/59  brain]
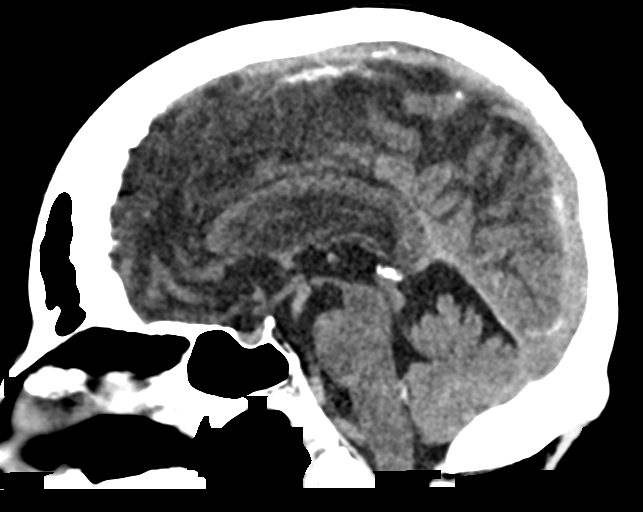
[im 39/59  brain]
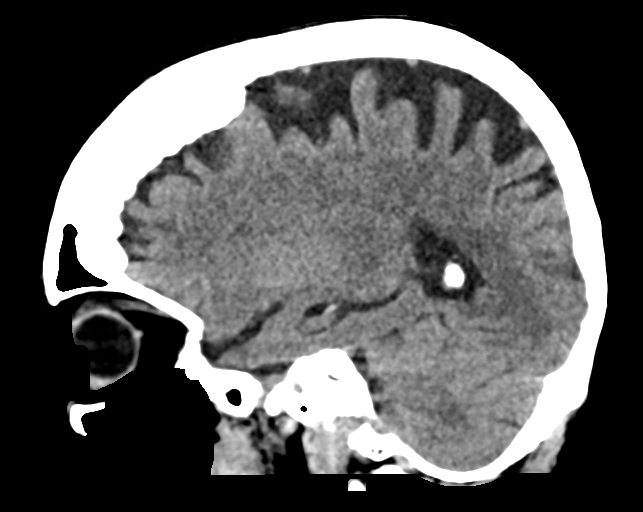

[16 of 47 positions shown; findings below may reference images not displayed]

FINDINGS: Brain: No evidence of acute infarction, hemorrhage, hydrocephalus,
extra-axial collection or mass lesion/mass effect.

Subcortical white matter and periventricular small vessel ischemic
changes. Mild age related atrophy.

Vascular: Intracranial atherosclerosis.

Skull: Normal. Negative for fracture or focal lesion. Hyperostosis
frontalis.

Sinuses/Orbits: The visualized paranasal sinuses are essentially
clear. The mastoid air cells are unopacified.

Other: None.
IMPRESSION: No evidence of acute intracranial abnormality. Atrophy with small
vessel ischemic changes.

## 2021-02-15 NOTE — ED Provider Notes (Signed)
[facility] Provider Note   CSN: [id number] Arrival date & time: 02/14/21  2249     History Chief Complaint  Patient presents with   Dizziness    [facility] is a 80 y.o. female.  The history is provided by the patient. The history is limited by the condition of the patient (dementia).  Dizziness Quality:  Lightheadedness Severity:  Moderate Onset quality:  Sudden Duration: seconds with standing. Timing:  Rare Progression:  Resolved Chronicity:  New Context: standing up   Relieved by:  Nothing Worsened by:  Nothing Ineffective treatments:  None tried Associated symptoms: no chest pain, no diarrhea, no tinnitus, no vision changes, no vomiting and no weakness   Risk factors: no hx of vertigo       Past Medical History:  Diagnosis Date   Diabetes mellitus without complication ([place])    Hypertension     Patient Active Problem List   Diagnosis Date Noted   Chronic cystitis 05/21/2019   Incomplete emptying of bladder 05/21/2019   Insomnia secondary to depression with anxiety 03/18/2019   Leg swelling 03/17/2019   Educated about COVID-19 virus infection 03/17/2019   Cognitive impairment 02/23/2019   Constipation 02/23/2019   History of hypertension 06/24/2018   Unintentional weight loss 11/05/2016   Hypoglycemia 10/30/2015   Fall 10/30/2015   Annual physical exam 07/20/2014   Colitis 04/24/2014   Essential hypertension 04/24/2014   Controlled type 2 diabetes mellitus with microalbuminuria or microproteinuria 04/24/2014   Hypotension 04/24/2014   High anion gap metabolic acidosis 123456   Acute kidney injury ([place]) 04/24/2014   Hyponatremia 04/24/2014   Normocytic anemia 04/24/2014   Infectious colitis 04/24/2014   Arthritis 12/24/2010   Gastroesophageal reflux disease without esophagitis 12/24/2010   History of recurrent UTIs 12/24/2010   Urinary incontinence, functional 12/24/2010    Past Surgical History:   Procedure Laterality Date   HERNIA REPAIR     TONSILLECTOMY       OB History   No obstetric history on file.     Family History  Problem Relation Age of Onset   Heart attack Father    Dementia Neg Hx    Alzheimer's disease Neg Hx     Social History   Tobacco Use   Smoking status: Never   Smokeless tobacco: Never  Vaping Use   Vaping Use: Never used  Substance Use Topics   Alcohol use: No   Drug use: No    Home Medications Prior to Admission medications   Medication Sig Start Date End Date Taking? Authorizing Provider  alendronate (FOSAMAX) 70 MG tablet Take 70 mg by mouth once a week.  03/24/14   [provider]  Ascorbic Acid (VITAMIN C) 1000 MG tablet Take 1,000 mg by mouth daily.    [provider]  aspirin (GOODSENSE ASPIRIN) 81 MG chewable tablet Chew 81 mg by mouth daily.    [provider]  beta carotene w/minerals (OCUVITE) tablet Take 1 tablet by mouth daily.    [provider]  Calcium Carbonate-Vitamin D 600-400 MG-UNIT tablet Take 1 tablet by mouth daily.    [provider]  Cholecalciferol (VITAMIN D3) 1000 units CAPS Take 1,000 Units by mouth daily.    [provider]  [employer] ARTHRITIS PAIN RELIEF 650 MG CR tablet Take 650 mg by mouth every 8 (eight) hours as needed for pain.  12/16/18   [provider]  doxycycline (VIBRAMYCIN) 100 MG capsule Take 100 mg by mouth daily.  11/03/18   [provider]  escitalopram (LEXAPRO) 10 MG tablet Take by mouth. 03/18/19   [provider]  furosemide (LASIX) 20 MG tablet Take by mouth. 03/08/19   [provider]  HYDROcodone-acetaminophen (NORCO/VICODIN) 5-325 MG tablet Take one tablet once or twice a day as needed for more severe pain 04/12/19   [provider]  hydrOXYzine (ATARAX/VISTARIL) 25 MG tablet Take 1 tablet (25 mg total) by mouth 2 (two) times daily as needed. 01/18/19   Zena Amos, MD  lactulose (CHRONULAC) 10  GM/15ML solution Take 10 g by mouth daily as needed for moderate constipation.  01/12/19   [provider]  Karlene Einstein 72 MCG capsule  05/18/19   [provider]  lisinopril (PRINIVIL,ZESTRIL) 20 MG tablet Take 20 mg by mouth daily. 10/03/15   [provider]  meloxicam (MOBIC) 7.5 MG tablet Take 7.5 mg by mouth daily. 01/10/19   [provider]  metFORMIN (GLUCOPHAGE) 500 MG tablet TAKE 1 TABLET BY MOUTH DAILY FOR 7 DAYS, THEN TAKE ONE TWICE DAILY WITH MEALS 01/22/19   [provider]  omeprazole (PRILOSEC) 40 MG capsule Take 40 mg by mouth 2 (two) times daily.     [provider]  oxybutynin (DITROPAN) 5 MG tablet Take 5 mg by mouth 2 (two) times daily. 10/19/15   [provider]  potassium chloride (KLOR-CON) 10 MEQ tablet Take by mouth. 04/04/19   [provider]  predniSONE (DELTASONE) 20 MG tablet Take 20 mg by mouth daily. 02/25/19   [provider]  traZODone (DESYREL) 50 MG tablet TAKE 1 TABLET BY MOUTH EVERY DAY AT NIGHT 03/18/19   [provider]  triamcinolone (KENALOG) 0.1 % paste AAA BID-TID PRN 04/01/19   [provider]  trimethoprim (TRIMPEX) 100 MG tablet Take 100 mg by mouth daily.    [provider]    Allergies    Ciprofloxacin  Review of Systems   Review of Systems  Unable to perform ROS: Dementia  Constitutional:  Negative for fever.  HENT:  Negative for facial swelling and tinnitus.   Respiratory:  Negative for wheezing and stridor.   Cardiovascular:  Negative for chest pain.  Gastrointestinal:  Negative for diarrhea and vomiting.  Musculoskeletal:  Negative for neck stiffness.  Skin:  Negative for rash.  Neurological:  Positive for light-headedness. Negative for weakness and numbness.  Psychiatric/Behavioral:  Negative for agitation.   All other systems reviewed and are negative.  Physical Exam Updated Vital Signs BP (!) 177/98   Pulse 86   Temp 98.2 F (36.8 C)  (Oral)   Resp 18   Ht 5\' 2"  (1.575 m)   Wt 79.4 kg   SpO2 100%   BMI 32.01 kg/m   Physical Exam Vitals and nursing note reviewed.  Constitutional:      Appearance: Normal appearance. She is not diaphoretic.  HENT:     Head: Normocephalic and atraumatic.     Nose: Nose normal.  Eyes:     Conjunctiva/sclera: Conjunctivae normal.     Pupils: Pupils are equal, round, and reactive to light.  Cardiovascular:     Rate and Rhythm: Normal rate and regular rhythm.     Pulses: Normal pulses.     Heart sounds: Normal heart sounds.  Pulmonary:     Effort: Pulmonary effort is normal.  Abdominal:     General: Abdomen is flat. Bowel sounds are normal.     Palpations: Abdomen is soft.     Tenderness: There  is no abdominal tenderness. There is no guarding.  Musculoskeletal:        General: Normal range of motion.     Cervical back: Normal range of motion and neck supple.  Skin:    General: Skin is warm and dry.     Capillary Refill: Capillary refill takes less than 2 seconds.  Neurological:     General: No focal deficit present.     Mental Status: She is alert.     Cranial Nerves: No cranial nerve deficit.     Deep Tendon Reflexes: Reflexes normal.  Psychiatric:        Mood and Affect: Mood normal.        Behavior: Behavior normal.    ED Results / Procedures / Treatments   Labs (all labs ordered are listed, but only abnormal results are displayed) Labs Reviewed  CBC WITH DIFFERENTIAL/PLATELET - Abnormal; Notable for the following components:      Result Value   RBC 3.39 (*)    Hemoglobin 11.4 (*)    HCT 33.1 (*)    Monocytes Absolute 1.2 (*)    All other components within normal limits  URINALYSIS, ROUTINE W REFLEX MICROSCOPIC - Abnormal; Notable for the following components:   Color, Urine STRAW (*)    Glucose, UA 50 (*)    Leukocytes,Ua SMALL (*)    All other components within normal limits  I-STAT CHEM 8, ED - Abnormal; Notable for the following components:   BUN 28 (*)     Glucose, Bld 212 (*)    Calcium, Ion 1.14 (*)    Hemoglobin 11.9 (*)    HCT 35.0 (*)    All other components within normal limits  TROPONIN I (HIGH SENSITIVITY)  TROPONIN I (HIGH SENSITIVITY)    EKG None  Radiology CT HEAD WO CONTRAST (5MM)  Result Date: 02/15/2021 CLINICAL DATA:  Dizziness, fall EXAM: CT HEAD WITHOUT CONTRAST TECHNIQUE: Contiguous axial images were obtained from the base of the skull through the vertex without intravenous contrast. COMPARISON:  None. FINDINGS: Brain: No evidence of acute infarction, hemorrhage, hydrocephalus, extra-axial collection or mass lesion/mass effect. Subcortical white matter and periventricular small vessel ischemic changes. Mild age related atrophy. Vascular: Intracranial atherosclerosis. Skull: Normal. Negative for fracture or focal lesion. Hyperostosis frontalis. Sinuses/Orbits: The visualized paranasal sinuses are essentially clear. The mastoid air cells are unopacified. Other: None. IMPRESSION: No evidence of acute intracranial abnormality. Atrophy with small vessel ischemic changes. Electronically Signed   By: Julian Hy M.D.   On: 02/15/2021 00:15   DG Chest Portable 1 View  Result Date: 02/14/2021 CLINICAL DATA:  Dizziness. EXAM: PORTABLE CHEST 1 VIEW COMPARISON:  Chest x-ray 09/26/2017. FINDINGS: The heart size and mediastinal contours are within normal limits. Both lungs are clear. The visualized skeletal structures are unremarkable. IMPRESSION: No active disease. Electronically Signed   By: Ronney Asters M.D.   On: 02/14/2021 23:59    Procedures Procedures   Medications Ordered in ED Medications - No data to display  ED Course  I have reviewed the triage vital signs and the nursing notes.  Pertinent labs & imaging results that were available during my care of the patient were reviewed by me and considered in my medical decision making (see chart for details).   Orthostatic.  Ruled out for MI in the ED.  I suspect less  than ioptimal liquid intake and have advised increased liquid intake.    [facility] was evaluated in Emergency Department on 02/15/2021 for the symptoms  described in the history of present illness. She was evaluated in the context of the global COVID-19 pandemic, which necessitated consideration that the patient might be at risk for infection with the SARS-CoV-2 virus that causes COVID-19. Institutional protocols and algorithms that pertain to the evaluation of patients at risk for COVID-19 are in a state of rapid change based on information released by regulatory bodies including the CDC and federal and state organizations. These policies and algorithms were followed during the patient's care in the ED.  Final Clinical Impression(s) / ED Diagnoses Final diagnoses:  None  Return for intractable cough, coughing up blood, fevers > 100.4 unrelieved by medication, shortness of breath, intractable vomiting, chest pain, shortness of breath, weakness, numbness, changes in speech, facial asymmetry, abdominal pain, passing out, Inability to tolerate liquids or food, cough, altered mental status or any concerns. No signs of systemic illness or infection. The patient is nontoxic-appearing on exam and vital signs are within normal limits.  I have reviewed the triage vital signs and the nursing notes. Pertinent labs & imaging results that were available during my care of the patient were reviewed by me and considered in my medical decision making (see chart for details). After history, exam, and medical workup I feel the patient has been appropriately medically screened and is safe for discharge home. Pertinent diagnoses were discussed with the patient. Patient was given return precautions.     Rx / DC Orders ED Discharge Orders     None        Jake Fuhrmann, MD 02/15/21 [id number]

## 2021-02-15 NOTE — ED Notes (Signed)
Pt ambulated to the bathroom with minimal assistance with steady gait

## 2021-02-15 NOTE — ED Notes (Signed)
This RN came into patients room to find pt getting dressed and pt had removed own IV. Pt refused discharge vitals saying "you guys have taken my vitals enough today." Pt educated on importance of discharge vitals. Pt discharged with husband.

## 2021-02-20 DIAGNOSIS — Z780 Asymptomatic menopausal state: Secondary | ICD-10-CM | POA: Diagnosis not present

## 2021-02-20 DIAGNOSIS — M8588 Other specified disorders of bone density and structure, other site: Secondary | ICD-10-CM | POA: Diagnosis not present

## 2021-02-20 DIAGNOSIS — M810 Age-related osteoporosis without current pathological fracture: Secondary | ICD-10-CM | POA: Diagnosis not present

## 2021-03-06 DIAGNOSIS — E119 Type 2 diabetes mellitus without complications: Secondary | ICD-10-CM | POA: Diagnosis not present

## 2021-03-06 DIAGNOSIS — M810 Age-related osteoporosis without current pathological fracture: Secondary | ICD-10-CM | POA: Diagnosis not present

## 2021-03-06 DIAGNOSIS — I10 Essential (primary) hypertension: Secondary | ICD-10-CM | POA: Diagnosis not present

## 2021-03-19 DIAGNOSIS — M25511 Pain in right shoulder: Secondary | ICD-10-CM | POA: Diagnosis not present

## 2021-03-19 DIAGNOSIS — M542 Cervicalgia: Secondary | ICD-10-CM | POA: Diagnosis not present

## 2021-03-29 DIAGNOSIS — R350 Frequency of micturition: Secondary | ICD-10-CM | POA: Diagnosis not present

## 2021-03-29 DIAGNOSIS — N3941 Urge incontinence: Secondary | ICD-10-CM | POA: Diagnosis not present

## 2021-03-30 ENCOUNTER — Encounter (HOSPITAL_COMMUNITY): Payer: Self-pay | Admitting: Emergency Medicine

## 2021-03-30 ENCOUNTER — Emergency Department (HOSPITAL_COMMUNITY): Payer: HMO

## 2021-03-30 ENCOUNTER — Other Ambulatory Visit: Payer: Self-pay

## 2021-03-30 ENCOUNTER — Emergency Department (HOSPITAL_COMMUNITY)
Admission: EM | Admit: 2021-03-30 | Discharge: 2021-03-31 | Disposition: A | Discharge status: Home / Self Care | Payer: HMO | Attending: Emergency Medicine | Admitting: Emergency Medicine

## 2021-03-30 DIAGNOSIS — I10 Essential (primary) hypertension: Secondary | ICD-10-CM | POA: Insufficient documentation

## 2021-03-30 DIAGNOSIS — Z7982 Long term (current) use of aspirin: Secondary | ICD-10-CM | POA: Insufficient documentation

## 2021-03-30 DIAGNOSIS — E1159 Type 2 diabetes mellitus with other circulatory complications: Secondary | ICD-10-CM | POA: Insufficient documentation

## 2021-03-30 DIAGNOSIS — R300 Dysuria: Secondary | ICD-10-CM

## 2021-03-30 DIAGNOSIS — R3981 Functional urinary incontinence: Secondary | ICD-10-CM | POA: Diagnosis not present

## 2021-03-30 DIAGNOSIS — F0390 Unspecified dementia without behavioral disturbance: Secondary | ICD-10-CM | POA: Diagnosis not present

## 2021-03-30 DIAGNOSIS — R339 Retention of urine, unspecified: Secondary | ICD-10-CM | POA: Diagnosis not present

## 2021-03-30 DIAGNOSIS — Z79899 Other long term (current) drug therapy: Secondary | ICD-10-CM | POA: Insufficient documentation

## 2021-03-30 DIAGNOSIS — R519 Headache, unspecified: Secondary | ICD-10-CM | POA: Diagnosis not present

## 2021-03-30 DIAGNOSIS — Z7984 Long term (current) use of oral hypoglycemic drugs: Secondary | ICD-10-CM | POA: Insufficient documentation

## 2021-03-30 IMAGING — CT CT HEAD W/O CM
3 of 4 series · 16 of 47 positions shown, 19 images · non-contrast
Comparison: [DATE]

CLINICAL DATA: New onset headaches and findings suggestive of UTI

EXAM:
CT HEAD WITHOUT CONTRAST
TECHNIQUE: Contiguous axial images were obtained from the base of the skull
through the vertex without intravenous contrast.

[Series 2: head wo · axial · 0.47mm/px · z∈[-121,+14]mm · 10 of 33 slices shown, 13 images]
[im 3/33  brain]
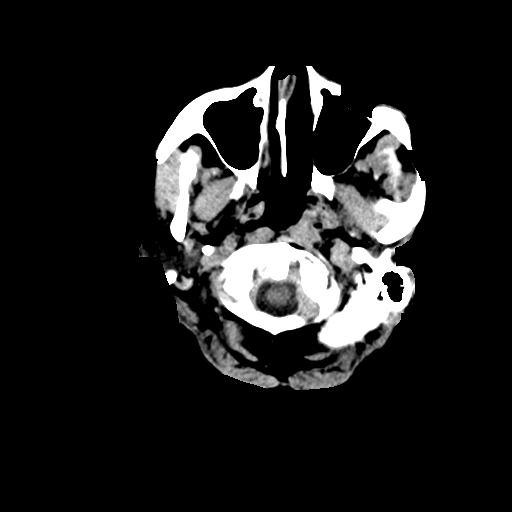
[im 3/33  bone]
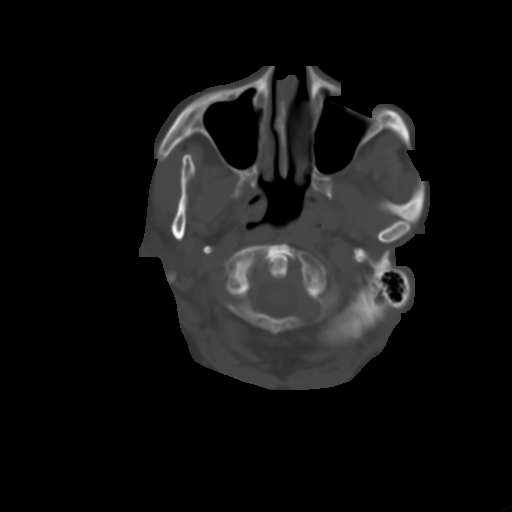
[im 5/33  brain]
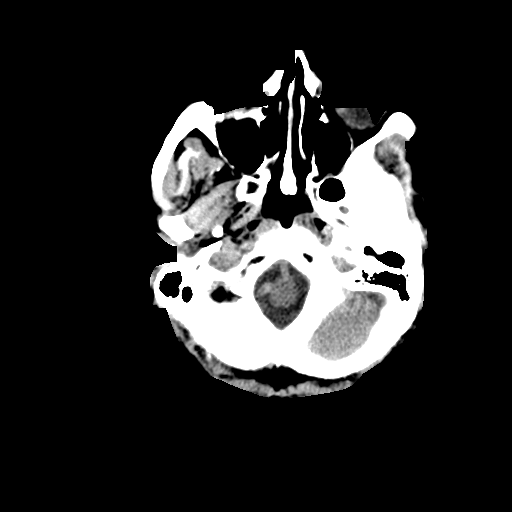
[im 10/33  brain]
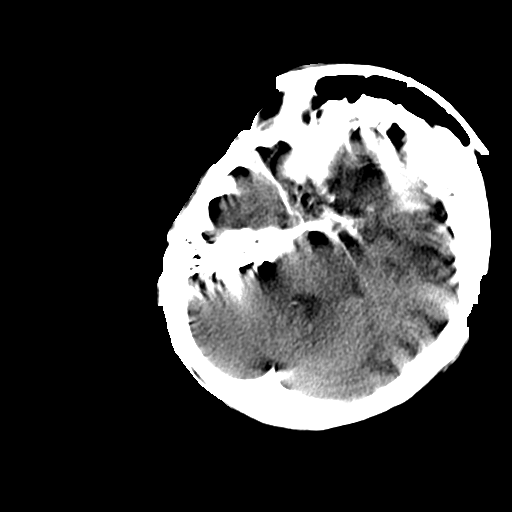
[im 12/33  brain]
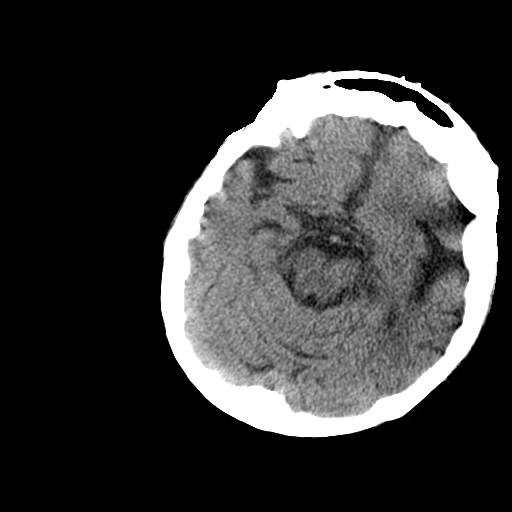
[im 14/33  brain]
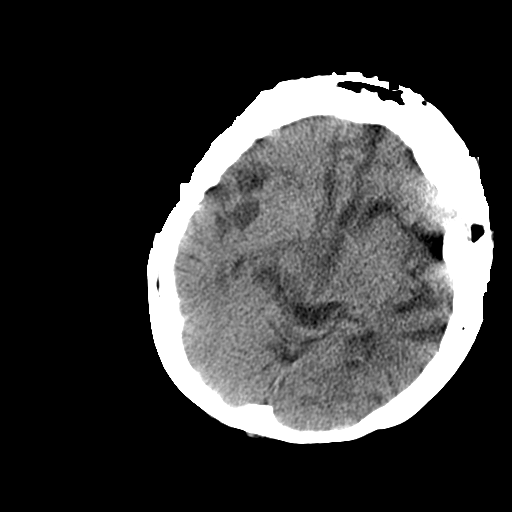
[im 14/33  bone]
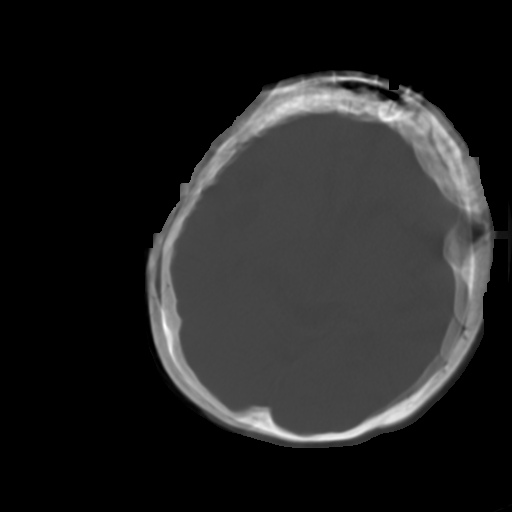
[im 19/33  brain]
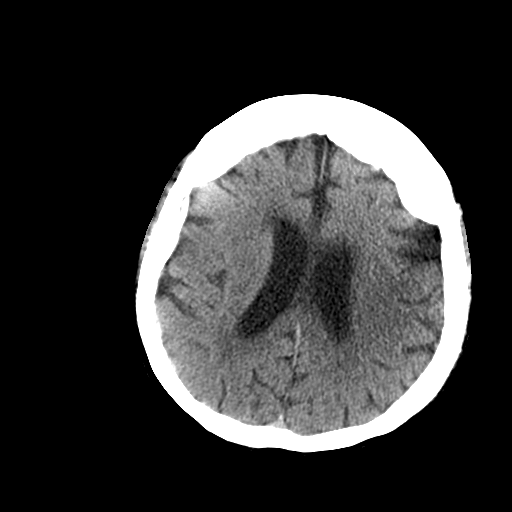
[im 21/33  brain]
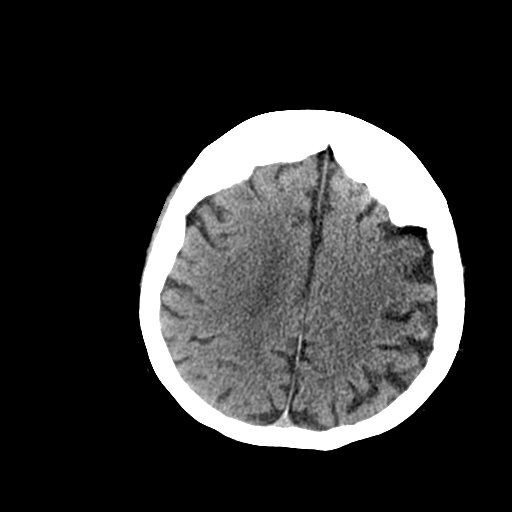
[im 23/33  brain]
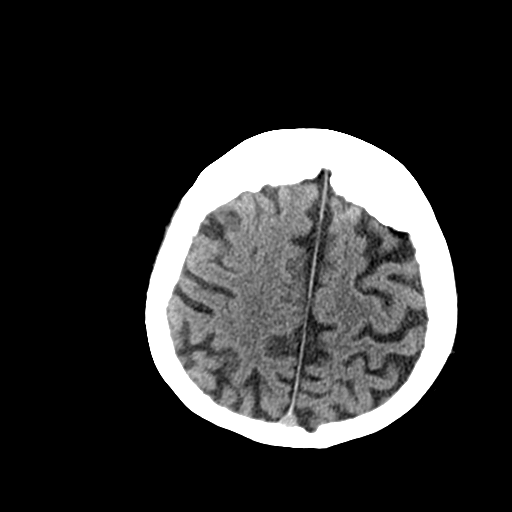
[im 28/33  brain]
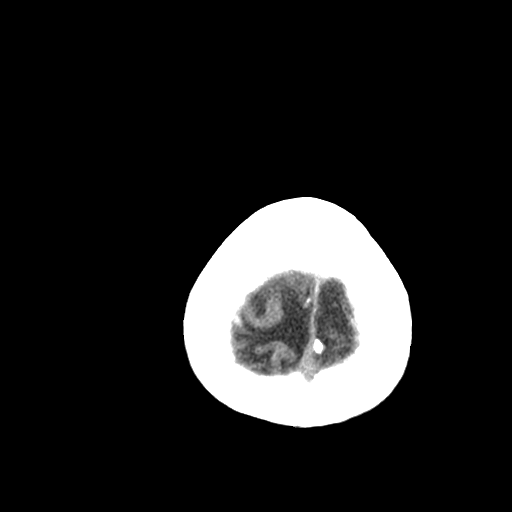
[im 28/33  bone]
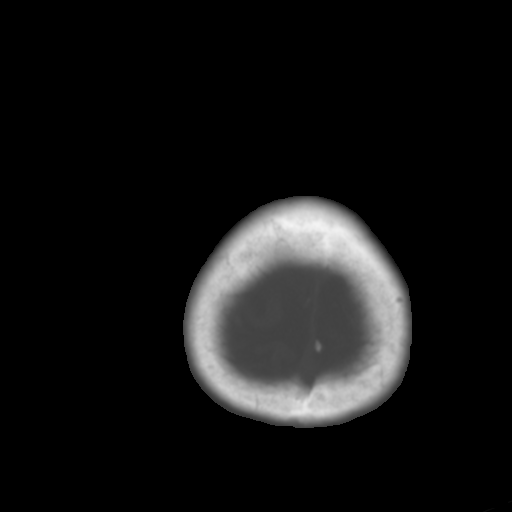
[im 30/33  brain]
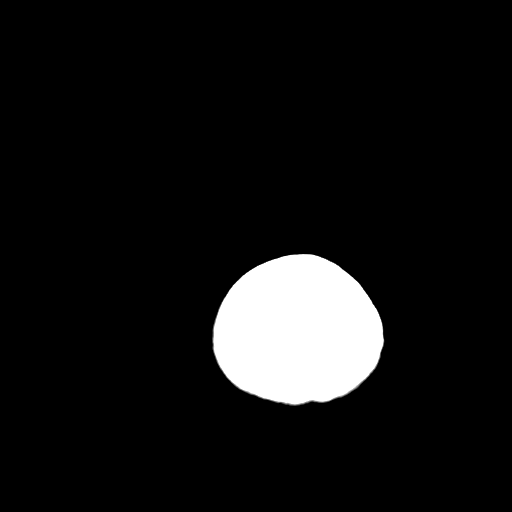

[Series 6: coronal soft tissue · coronal · 0.34mm/px · 3 of 69 slices shown]
[im 23/69  brain]
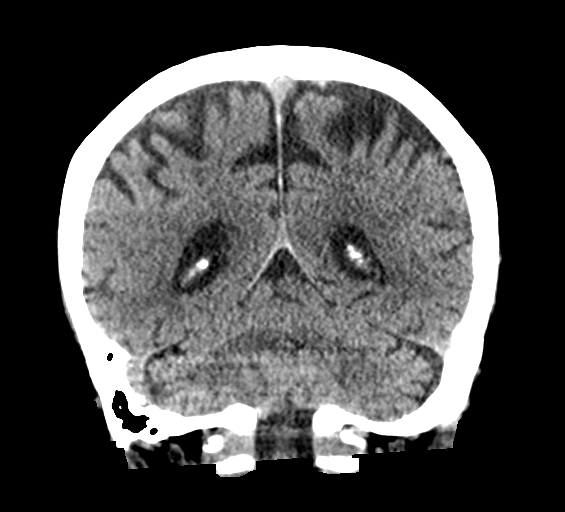
[im 31/69  brain]
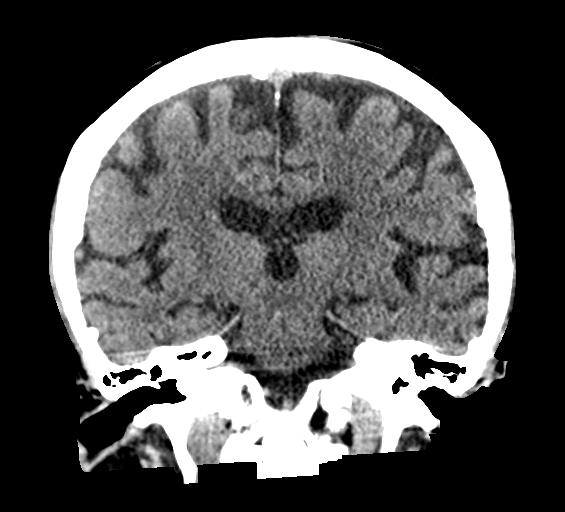
[im 38/69  brain]
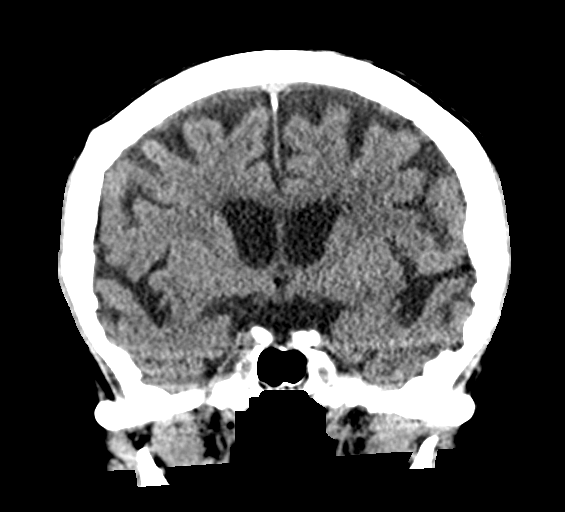

[Series 7: sagittal soft tissue · sagittal · 0.34mm/px · 3 of 63 slices shown]
[im 21/63  brain]
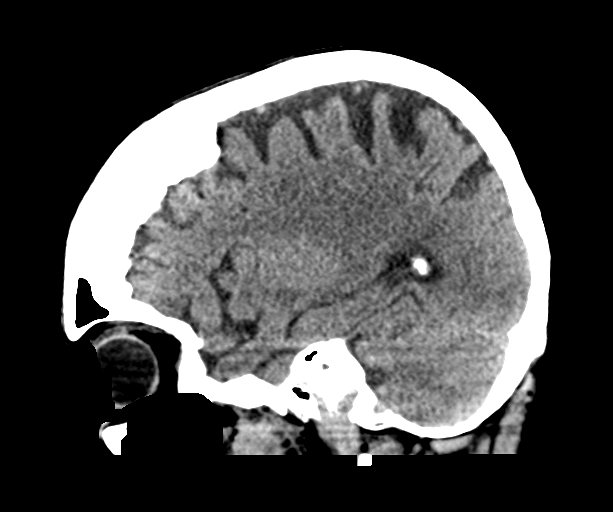
[im 32/63  brain]
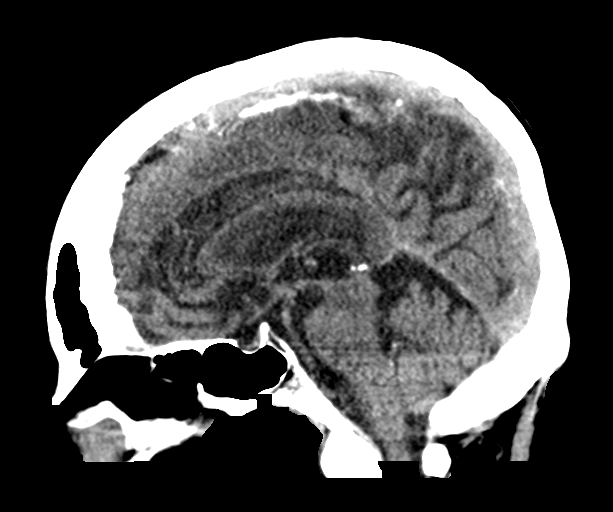
[im 42/63  brain]
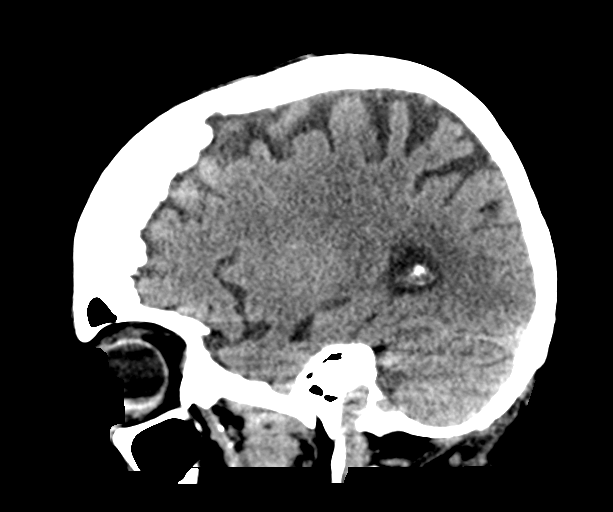

[16 of 47 positions shown; findings below may reference images not displayed]

FINDINGS: Brain: No evidence of acute infarction, hemorrhage, hydrocephalus,
extra-axial collection or mass lesion/mass effect. Mild atrophic and
chronic white matter ischemic changes are noted.

Vascular: No hyperdense vessel or unexpected calcification.

Skull: Normal. Negative for fracture or focal lesion.

Sinuses/Orbits: No acute finding.

Other: None.
IMPRESSION: Chronic atrophic and ischemic changes without acute abnormality.

## 2021-03-30 MED ORDER — ACETAMINOPHEN 325 MG PO TABS
650.0000 mg | ORAL_TABLET | Freq: Once | ORAL | Status: AC
  Administered 2021-03-30: 650 mg via ORAL
  Filled 2021-03-30: qty 2

## 2021-03-30 NOTE — ED Provider Notes (Signed)
Emergency Medicine Provider Triage Evaluation Note  [facility] , a 81 y.o. female  was evaluated in triage.  Pt complains of urinary incontinence that started 3 days ago. She denies fevers, nv. Seen by pcp yesterday, ua was neg. Started on oxybutynin which has not improved sxs.  Review of Systems  Positive: Urinary incontinence Negative: Fever, nv  Physical Exam  BP (!) 143/125 (BP Location: Right Arm)    Pulse 89    Temp 97.9 F (36.6 C) (Oral)    Resp 20    Ht 5\' 2"  (1.575 m)    Wt 79.4 kg    SpO2 93%    BMI 32.01 kg/m  Gen:   Awake, no distress   Resp:  Normal effort  MSK:   Moves extremities without difficulty   Medical Decision Making  Medically screening exam initiated at 8:19 PM.  Appropriate orders placed.  [facility] was informed that the remainder of the evaluation will be completed by another provider, this initial triage assessment does not replace that evaluation, and the importance of remaining in the ED until their evaluation is complete.    [name] 03/30/21 2019    04/01/21, MD 03/30/21 [id number]

## 2021-03-30 NOTE — ED Notes (Signed)
Pt c/o of sudden onset neck pain while this Clinical research associate and Daniel/EMT in rm Pt immediately placed on cardiac monitor Sammie/PA in room EKG captured and given to same RN Tresa Endo C. notified

## 2021-03-30 NOTE — ED Triage Notes (Addendum)
Patient presents with dysuria, urinary frequency and foul smelling urine. Patient states that she was seen by her PCP and she was told she did not have an infection. Patient started on oxybutinin without relief. Patient noted to be having incontinence, urinating twice in the room during triage.

## 2021-03-30 NOTE — ED Provider Notes (Signed)
[facility]-EMERGENCY DEPT Provider Note   CSN: [id number] Arrival date & time: 03/30/21  1957     History Chief Complaint  Patient presents with   Dysuria    Lorianna Spadaccini is a 80 y.o. female.  Shacoria Latif is a 80 y.o. female with a history of hypertension, diabetes, dementia, who presents to the emergency department for evaluation of dysuria, urinary frequency and incontinence.  Symptoms started 3 days ago.  Patient was evaluated by her PCP for the same symptoms yesterday, at that time urinalysis did not show signs of infection and patient was started on oxybutynin for potential bladder spasms.  Patient and husband report that this medication has not helped and patient continues to have painful urination and frequent episodes of incontinence.  Husband reports that patient has to go to the bathroom so frequently and she can often not make it to the bathroom in time.  Patient denies any associated abdominal or flank pain.  No fevers or chills reported by husband, no vomiting.  He reports that she has been acting at baseline, has underlying dementia.  No other aggravating or alleviating factors.  Level 5 caveat: Dementia  The history is provided by the patient, the spouse and medical records.      Past Medical History:  Diagnosis Date   Diabetes mellitus without complication (HCC)    Hypertension     Patient Active Problem List   Diagnosis Date Noted   Chronic cystitis 05/21/2019   Incomplete emptying of bladder 05/21/2019   Insomnia secondary to depression with anxiety 03/18/2019   Leg swelling 03/17/2019   Educated about COVID-19 virus infection 03/17/2019   Cognitive impairment 02/23/2019   Constipation 02/23/2019   History of hypertension 06/24/2018   Unintentional weight loss 11/05/2016   Hypoglycemia 10/30/2015   Fall 10/30/2015   Annual physical exam 07/20/2014   Colitis 04/24/2014   Essential hypertension 04/24/2014   Controlled type 2 diabetes  mellitus with microalbuminuria or microproteinuria 04/24/2014   Hypotension 04/24/2014   High anion gap metabolic acidosis 04/24/2014   Acute kidney injury (HCC) 04/24/2014   Hyponatremia 04/24/2014   Normocytic anemia 04/24/2014   Infectious colitis 04/24/2014   Arthritis 12/24/2010   Gastroesophageal reflux disease without esophagitis 12/24/2010   History of recurrent UTIs 12/24/2010   Urinary incontinence, functional 12/24/2010    Past Surgical History:  Procedure Laterality Date   HERNIA REPAIR     TONSILLECTOMY       OB History   No obstetric history on file.     Family History  Problem Relation Age of Onset   Heart attack Father    Dementia Neg Hx    Alzheimer's disease Neg Hx     Social History   Tobacco Use   Smoking status: Never   Smokeless tobacco: Never  Vaping Use   Vaping Use: Never used  Substance Use Topics   Alcohol use: No   Drug use: No    Home Medications Prior to Admission medications   Medication Sig Start Date End Date Taking? Authorizing Provider  cephALEXin (KEFLEX) 500 MG capsule Take 1 capsule (500 mg total) by mouth 3 (three) times daily for 7 days. 03/31/21 04/07/21 Yes Dartha Lodge, PA-C  alendronate (FOSAMAX) 70 MG tablet Take 70 mg by mouth once a week.  03/24/14   [provider]  Ascorbic Acid (VITAMIN C) 1000 MG tablet Take 1,000 mg by mouth daily.    [provider]  aspirin (GOODSENSE ASPIRIN) 81 MG  chewable tablet Chew 81 mg by mouth daily.    [provider]  beta carotene w/minerals (OCUVITE) tablet Take 1 tablet by mouth daily.    [provider]  Calcium Carbonate-Vitamin D 600-400 MG-UNIT tablet Take 1 tablet by mouth daily.    [provider]  Cholecalciferol (VITAMIN D3) 1000 units CAPS Take 1,000 Units by mouth daily.    [provider]  [employer] ARTHRITIS PAIN RELIEF 650 MG CR tablet Take 650 mg by mouth every 8 (eight) hours as needed for pain.  12/16/18   [provider]  doxycycline (VIBRAMYCIN) 100 MG capsule Take 100 mg by mouth daily.  11/03/18   [provider]  escitalopram (LEXAPRO) 10 MG tablet Take by mouth. 03/18/19   [provider]  furosemide (LASIX) 20 MG tablet Take by mouth. 03/08/19   [provider]  HYDROcodone-acetaminophen (NORCO/VICODIN) 5-325 MG tablet Take one tablet once or twice a day as needed for more severe pain 04/12/19   [provider]  hydrOXYzine (ATARAX/VISTARIL) 25 MG tablet Take 1 tablet (25 mg total) by mouth 2 (two) times daily as needed. 01/18/19   [place] Crane, MD  lactulose (CHRONULAC) 10 GM/15ML solution Take 10 g by mouth daily as needed for moderate constipation.  01/12/19   [provider]  Rolan Lipa 72 MCG capsule  05/18/19   [provider]  lisinopril (PRINIVIL,ZESTRIL) 20 MG tablet Take 20 mg by mouth daily. 10/03/15   [provider]  meloxicam (MOBIC) 7.5 MG tablet Take 7.5 mg by mouth daily. 01/10/19   [provider]  metFORMIN (GLUCOPHAGE) 500 MG tablet TAKE 1 TABLET BY MOUTH DAILY FOR 7 DAYS, THEN TAKE ONE TWICE DAILY WITH MEALS 01/22/19   [provider]  omeprazole (PRILOSEC) 40 MG capsule Take 40 mg by mouth 2 (two) times daily.     [provider]  oxybutynin (DITROPAN) 5 MG tablet Take 5 mg by mouth 2 (two) times daily. 10/19/15   [provider]  potassium chloride (KLOR-CON) 10 MEQ tablet Take by mouth. 04/04/19   [provider]  predniSONE (DELTASONE) 20 MG tablet Take 20 mg by mouth daily. 02/25/19   [provider]  traZODone (DESYREL) 50 MG tablet TAKE 1 TABLET BY MOUTH EVERY DAY AT NIGHT 03/18/19   [provider]  triamcinolone (KENALOG) 0.1 % paste AAA BID-TID PRN 04/01/19   [provider]  trimethoprim (TRIMPEX) 100 MG tablet Take 100 mg by mouth daily.    [provider]    Allergies    Ciprofloxacin  Review of Systems   Review of Systems   Constitutional:  Negative for chills and fever.  HENT: Negative.    Respiratory:  Negative for shortness of breath.   Cardiovascular:  Negative for chest pain.  Gastrointestinal:  Negative for abdominal pain, nausea and vomiting.  Genitourinary:  Positive for dysuria and frequency.  All other systems reviewed and are negative.  Physical Exam Updated Vital Signs BP (!) 185/81 (BP Location: Right Arm)    Pulse 67    Temp 97.9 F (36.6 C) (Oral)    Resp 16    Ht 5\' 2"  (1.575 m)    Wt 79.4 kg    SpO2 100%    BMI 32.01 kg/m   Physical Exam Vitals and nursing note reviewed.  Constitutional:      General: She is not in acute distress.    Appearance: Normal appearance. She is well-developed. She is not ill-appearing or diaphoretic.  Comments: Pt is alert and oriented to baseline, appears anxious, but is in no acute distress  HENT:     Head: Normocephalic and atraumatic.     Mouth/Throat:     Mouth: Mucous membranes are moist.     Pharynx: Oropharynx is clear.  Eyes:     General:        Right eye: No discharge.        Left eye: No discharge.     Extraocular Movements: Extraocular movements intact.     Pupils: Pupils are equal, round, and reactive to light.  Cardiovascular:     Rate and Rhythm: Normal rate and regular rhythm.     Pulses: Normal pulses.     Heart sounds: Normal heart sounds.  Pulmonary:     Effort: Pulmonary effort is normal. No respiratory distress.     Breath sounds: Normal breath sounds. No wheezing or rales.     Comments: Respirations equal and unlabored, patient able to speak in full sentences, lungs clear to auscultation bilaterally  Abdominal:     General: Bowel sounds are normal. There is no distension.     Palpations: Abdomen is soft. There is no mass.     Tenderness: There is no abdominal tenderness. There is no guarding.     Comments: Abdomen soft, nondistended, nontender to palpation in all quadrants without guarding or peritoneal signs, no CVA  tenderness  Musculoskeletal:        General: No deformity.     Cervical back: Normal range of motion and neck supple. No tenderness.  Skin:    General: Skin is warm and dry.     Capillary Refill: Capillary refill takes less than 2 seconds.  Neurological:     Mental Status: She is alert and oriented to person, place, and time.     Coordination: Coordination normal.     Comments: Speech is clear, able to follow commands CN III-XII intact Normal strength in upper and lower extremities bilaterally including dorsiflexion and plantar flexion, strong and equal grip strength Sensation normal to light and sharp touch Moves extremities without ataxia, coordination intact  Psychiatric:        Mood and Affect: Mood normal.        Behavior: Behavior normal.    ED Results / Procedures / Treatments   Labs (all labs ordered are listed, but only abnormal results are displayed) Labs Reviewed  URINALYSIS, ROUTINE W REFLEX MICROSCOPIC - Abnormal; Notable for the following components:      Result Value   Glucose, UA 50 (*)    Leukocytes,Ua MODERATE (*)    All other components within normal limits  BASIC METABOLIC PANEL - Abnormal; Notable for the following components:   Sodium 133 (*)    Potassium 3.1 (*)    Glucose, Bld 200 (*)    All other components within normal limits  CBC WITH DIFFERENTIAL/PLATELET - Abnormal; Notable for the following components:   RBC 3.45 (*)    Hemoglobin 11.6 (*)    HCT 34.7 (*)    MCV 100.6 (*)    All other components within normal limits  CBG MONITORING, ED - Abnormal; Notable for the following components:   Glucose-Capillary 204 (*)    All other components within normal limits  URINE CULTURE    EKG EKG Interpretation  Date/Time:  Friday March 30 2021 22:50:07 EST Ventricular Rate:  65 PR Interval:  146 QRS Duration: 97 QT Interval:  401 QTC Calculation: 417 R Axis:   12  Text Interpretation: Sinus rhythm Abnormal R-wave progression, early transition  Confirmed by Quintella Reichert [phone]) on 03/30/2021 11:25:08 PM  Radiology CT HEAD WO CONTRAST (5MM)  Result Date: 03/30/2021 CLINICAL DATA:  New onset headaches and findings suggestive of UTI EXAM: CT HEAD WITHOUT CONTRAST TECHNIQUE: Contiguous axial images were obtained from the base of the skull through the vertex without intravenous contrast. COMPARISON:  02/15/2021 FINDINGS: Brain: No evidence of acute infarction, hemorrhage, hydrocephalus, extra-axial collection or mass lesion/mass effect. Mild atrophic and chronic white matter ischemic changes are noted. Vascular: No hyperdense vessel or unexpected calcification. Skull: Normal. Negative for fracture or focal lesion. Sinuses/Orbits: No acute finding. Other: None. IMPRESSION: Chronic atrophic and ischemic changes without acute abnormality. Electronically Signed   By: Inez Catalina M.D.   On: 03/30/2021 23:53    Procedures Procedures   Medications Ordered in ED Medications  acetaminophen (TYLENOL) tablet 650 mg (650 mg Oral Given 03/30/21 2330)  potassium chloride SA (KLOR-CON M) CR tablet 40 mEq (40 mEq Oral Given 03/31/21 0225)  cephALEXin (KEFLEX) capsule 500 mg (500 mg Oral Given 03/31/21 0227)    ED Course  I have reviewed the triage vital signs and the nursing notes.  Pertinent labs & imaging results that were available during my care of the patient were reviewed by me and considered in my medical decision making (see chart for details).    MDM Rules/Calculators/A&P                          80 year old female presents for dysuria, urinary frequency and incontinence.  Symptoms present for the past 3 days.  No associated fevers, on arrival patient is well-appearing, pleasantly demented, husband reports she is at baseline mental status.  She has no abdominal or flank tenderness on exam.  Upon arrival to the ED she also reported sudden onset pain on the right side of the posterior scalp and neck, no associated trauma.  No focal  neurologic deficits.  Will get CT.  We will also check basic labs and urinalysis and will check bladder scan to assess for urinary retention.  Patient may be having overflow incontinence.  531 cc of urine noted on bladder scan despite patient voiding about 30 minutes prior to this.  Suspect patient is having urinary retention and overflow incontinence.  Foley catheter placed  I have independently ordered, reviewed and interpreted all labs and imaging: CBC: No leukocytosis, stable hemoglobin BMP: Sodium 1.33, potassium 3.1, p.o. potassium replacement given, glucose of 200, normal renal function UA: Moderate leukocytes with 11-20 WBCs present, no bacteria seen, will send for culture.  Given patient's dysuria and symptoms will treat for UTI pending cultures  CT of the head is unremarkable.  Nursing provided patient and husband with education regarding catheter.  We will have patient follow-up closely with urology for urinary retention and treat with antibiotics in the meantime.  Return precautions provided.  Patient and husband expressed understanding.  Discharged home in good condition.   Final Clinical Impression(s) / ED Diagnoses Final diagnoses:  Urinary retention  Dysuria    Rx / DC Orders ED Discharge Orders          Ordered    cephALEXin (KEFLEX) 500 MG capsule  3 times daily        03/31/21 0302             Jacqlyn Larsen, PA-C 04/01/21 0725    Quintella Reichert, MD 04/01/21 [phone]

## 2021-03-31 LAB — URINALYSIS, ROUTINE W REFLEX MICROSCOPIC
Bacteria, UA: NONE SEEN
Bilirubin Urine: NEGATIVE
Glucose, UA: 50 mg/dL — AB
Hgb urine dipstick: NEGATIVE
Ketones, ur: NEGATIVE mg/dL
Nitrite: NEGATIVE
Protein, ur: NEGATIVE mg/dL
Specific Gravity, Urine: 1.013 (ref 1.005–1.030)
pH: 6 (ref 5.0–8.0)

## 2021-03-31 LAB — CBC WITH DIFFERENTIAL/PLATELET
Abs Immature Granulocytes: 0.03 10*3/uL (ref 0.00–0.07)
Basophils Absolute: 0 10*3/uL (ref 0.0–0.1)
Basophils Relative: 1 %
Eosinophils Absolute: 0.1 10*3/uL (ref 0.0–0.5)
Eosinophils Relative: 2 %
HCT: 34.7 % — ABNORMAL LOW (ref 36.0–46.0)
Hemoglobin: 11.6 g/dL — ABNORMAL LOW (ref 12.0–15.0)
Immature Granulocytes: 1 %
Lymphocytes Relative: 27 %
Lymphs Abs: 1.7 10*3/uL (ref 0.7–4.0)
MCH: 33.6 pg (ref 26.0–34.0)
MCHC: 33.4 g/dL (ref 30.0–36.0)
MCV: 100.6 fL — ABNORMAL HIGH (ref 80.0–100.0)
Monocytes Absolute: 1 10*3/uL (ref 0.1–1.0)
Monocytes Relative: 15 %
Neutro Abs: 3.5 10*3/uL (ref 1.7–7.7)
Neutrophils Relative %: 54 %
Platelets: 304 10*3/uL (ref 150–400)
RBC: 3.45 MIL/uL — ABNORMAL LOW (ref 3.87–5.11)
RDW: 13.8 % (ref 11.5–15.5)
WBC: 6.4 10*3/uL (ref 4.0–10.5)
nRBC: 0 % (ref 0.0–0.2)

## 2021-03-31 LAB — BASIC METABOLIC PANEL
Anion gap: 9 (ref 5–15)
BUN: 22 mg/dL (ref 8–23)
CO2: 26 mmol/L (ref 22–32)
Calcium: 9 mg/dL (ref 8.9–10.3)
Chloride: 98 mmol/L (ref 98–111)
Creatinine, Ser: 0.95 mg/dL (ref 0.44–1.00)
GFR, Estimated: >60 mL/min (ref >60)
Glucose, Bld: 200 mg/dL — ABNORMAL HIGH (ref 70–99)
Potassium: 3.1 mmol/L — ABNORMAL LOW (ref 3.5–5.1)
Sodium: 133 mmol/L — ABNORMAL LOW (ref 135–145)

## 2021-03-31 LAB — CBG MONITORING, ED: Glucose-Capillary: 204 mg/dL — ABNORMAL HIGH (ref 70–99)

## 2021-03-31 MED ORDER — CEPHALEXIN 500 MG PO CAPS
500.0000 mg | ORAL_CAPSULE | Freq: Once | ORAL | Status: AC
  Administered 2021-03-31: 500 mg via ORAL
  Filled 2021-03-31: qty 1

## 2021-03-31 MED ORDER — CEPHALEXIN 500 MG PO CAPS: 500.0000 mg | ORAL_CAPSULE | Freq: Three times a day (TID) | ORAL | 0 refills | Status: AC

## 2021-03-31 MED ORDER — POTASSIUM CHLORIDE CRYS ER 20 MEQ PO TBCR
40.0000 meq | EXTENDED_RELEASE_TABLET | Freq: Once | ORAL | Status: AC
  Administered 2021-03-31: 40 meq via ORAL
  Filled 2021-03-31: qty 2

## 2021-03-31 NOTE — ED Notes (Signed)
Attempted to provide foley care education multiple times with husband. He verbalized understanding for care and maintenance of catheter.

## 2021-03-31 NOTE — Discharge Instructions (Addendum)
You have urinary retention, this is what is causing you to feel like you need to go to the bathroom frequently and have episodes of incontinence.  A Foley catheter was placed and this will need to stay in place until you follow-up with the urologist.  Discontinue the oxybutynin until you see the urologist.  Your urine showed some signs of infection today so we will also treat you with antibiotics which you should take 3 times daily.  You will be called by phone if your culture shows that antibiotics are no longer needed or a different antibiotic would be more appropriate.  Please call Monday morning to schedule close follow-up with urology.  Follow the instructions provided today for care of the catheter  Return for new or worsening symptoms.

## 2021-04-01 ENCOUNTER — Emergency Department (HOSPITAL_COMMUNITY)
Admission: EM | Admit: 2021-04-01 | Discharge: 2021-04-02 | Disposition: A | Discharge status: Home / Self Care | Payer: HMO | Attending: Emergency Medicine | Admitting: Emergency Medicine

## 2021-04-01 ENCOUNTER — Encounter (HOSPITAL_COMMUNITY): Payer: Self-pay

## 2021-04-01 ENCOUNTER — Other Ambulatory Visit: Payer: Self-pay

## 2021-04-01 DIAGNOSIS — E119 Type 2 diabetes mellitus without complications: Secondary | ICD-10-CM | POA: Diagnosis not present

## 2021-04-01 DIAGNOSIS — R32 Unspecified urinary incontinence: Secondary | ICD-10-CM | POA: Insufficient documentation

## 2021-04-01 DIAGNOSIS — R0682 Tachypnea, not elsewhere classified: Secondary | ICD-10-CM | POA: Diagnosis not present

## 2021-04-01 DIAGNOSIS — Z79899 Other long term (current) drug therapy: Secondary | ICD-10-CM | POA: Diagnosis not present

## 2021-04-01 DIAGNOSIS — T83028A Displacement of other indwelling urethral catheter, initial encounter: Secondary | ICD-10-CM | POA: Diagnosis not present

## 2021-04-01 DIAGNOSIS — Z7982 Long term (current) use of aspirin: Secondary | ICD-10-CM | POA: Diagnosis not present

## 2021-04-01 DIAGNOSIS — K219 Gastro-esophageal reflux disease without esophagitis: Secondary | ICD-10-CM | POA: Diagnosis not present

## 2021-04-01 DIAGNOSIS — I10 Essential (primary) hypertension: Secondary | ICD-10-CM | POA: Diagnosis not present

## 2021-04-01 DIAGNOSIS — T839XXA Unspecified complication of genitourinary prosthetic device, implant and graft, initial encounter: Secondary | ICD-10-CM

## 2021-04-01 DIAGNOSIS — R300 Dysuria: Secondary | ICD-10-CM | POA: Insufficient documentation

## 2021-04-01 DIAGNOSIS — Z7952 Long term (current) use of systemic steroids: Secondary | ICD-10-CM | POA: Insufficient documentation

## 2021-04-01 LAB — URINE CULTURE

## 2021-04-01 NOTE — ED Triage Notes (Signed)
Pt had a catheter placed 2 days ago and pulled it out after getting up out of her recliner.

## 2021-04-01 NOTE — ED Provider Notes (Signed)
Emergency Medicine Provider Triage Evaluation Note  Victoria Tyler , a 80 y.o. female  was evaluated in triage.  Pt complains of foley catheter problem. Pt here with husband. He states pt was recently seen for eval of urinary problems and had a foley placed. The foley got stuck on a chair pta and the pt walked away pulling the catheter out. Pt is now c/o pain and is having urinary incontinence.  Review of Systems  Positive: Urinary incontinence, urethral pain Negative: hematuria  Physical Exam  BP (!) 132/113 (BP Location: Right Arm)    Pulse 73    Temp 98.4 F (36.9 C) (Oral)    Resp 20    Ht 5\' 2"  (1.575 m)    Wt 79.4 kg    SpO2 97%    BMI 32.01 kg/m  Gen:   Awake Resp:  Normal effort  MSK:   Moves extremities without difficulty  Other:  Pt stood up and had an episode of urinary incontinence on the floor, she appears uncomfortable  Medical Decision Making  Medically screening exam initiated at 9:44 PM.  Appropriate orders placed.  Victoria Tyler was informed that the remainder of the evaluation will be completed by another provider, this initial triage assessment does not replace that evaluation, and the importance of remaining in the ED until their evaluation is complete.     [name] 04/01/21 2146    [id number], MD 04/01/21 2156

## 2021-04-01 NOTE — Discharge Instructions (Signed)
As discussed, your evaluation today has been largely reassuring.  But, it is important that you monitor your condition carefully, and do not hesitate to return to the ED if you develop new, or concerning changes in your condition. ? ?Otherwise, please follow-up with your physician for appropriate ongoing care. ? ?

## 2021-04-01 NOTE — ED Provider Notes (Signed)
[facility]-EMERGENCY DEPT Provider Note   CSN: [id number] Arrival date & time: 04/01/21  2129     History Chief Complaint  Patient presents with   Catheter Issue     Victoria Tyler is a 80 y.o. female.  HPI Adult female presents with her husband 2 days after being seen, evaluated, discharged with Foley catheter now with concern for dislodgment of said catheter.  Patient was seen for cystitis, urinary frequency, started on Keflex, for catheter was placed for concern of retention.  She is scheduled to arrange follow-up tomorrow, but today, catheter got tangled in her clothing and was accidentally removed.  She has no abdominal pain, no chest pain, no fever, no other new complaints, states that she is essentially about the same as she was 2 days ago, corroborated by her husband. She has been taking her antibiotics as directed.  She does not specify new dysuria, or hematuria.   Past Medical History:  Diagnosis Date   Diabetes mellitus without complication (HCC)    Hypertension     Patient Active Problem List   Diagnosis Date Noted   Chronic cystitis 05/21/2019   Incomplete emptying of bladder 05/21/2019   Insomnia secondary to depression with anxiety 03/18/2019   Leg swelling 03/17/2019   Educated about COVID-19 virus infection 03/17/2019   Cognitive impairment 02/23/2019   Constipation 02/23/2019   History of hypertension 06/24/2018   Unintentional weight loss 11/05/2016   Hypoglycemia 10/30/2015   Fall 10/30/2015   Annual physical exam 07/20/2014   Colitis 04/24/2014   Essential hypertension 04/24/2014   Controlled type 2 diabetes mellitus with microalbuminuria or microproteinuria 04/24/2014   Hypotension 04/24/2014   High anion gap metabolic acidosis 04/24/2014   Acute kidney injury (HCC) 04/24/2014   Hyponatremia 04/24/2014   Normocytic anemia 04/24/2014   Infectious colitis 04/24/2014   Arthritis 12/24/2010   Gastroesophageal reflux disease  without esophagitis 12/24/2010   History of recurrent UTIs 12/24/2010   Urinary incontinence, functional 12/24/2010    Past Surgical History:  Procedure Laterality Date   HERNIA REPAIR     TONSILLECTOMY       OB History   No obstetric history on file.     Family History  Problem Relation Age of Onset   Heart attack Father    Dementia Neg Hx    Alzheimer's disease Neg Hx     Social History   Tobacco Use   Smoking status: Never   Smokeless tobacco: Never  Vaping Use   Vaping Use: Never used  Substance Use Topics   Alcohol use: No   Drug use: No    Home Medications Prior to Admission medications   Medication Sig Start Date End Date Taking? Authorizing Provider  alendronate (FOSAMAX) 70 MG tablet Take 70 mg by mouth once a week.  03/24/14   [provider]  Ascorbic Acid (VITAMIN C) 1000 MG tablet Take 1,000 mg by mouth daily.    [provider]  aspirin (GOODSENSE ASPIRIN) 81 MG chewable tablet Chew 81 mg by mouth daily.    [provider]  beta carotene w/minerals (OCUVITE) tablet Take 1 tablet by mouth daily.    [provider]  Calcium Carbonate-Vitamin D 600-400 MG-UNIT tablet Take 1 tablet by mouth daily.    [provider]  cephALEXin (KEFLEX) 500 MG capsule Take 1 capsule (500 mg total) by mouth 3 (three) times daily for 7 days. 03/31/21 04/07/21  Dartha Lodge, PA-C  Cholecalciferol (VITAMIN D3) 1000 units CAPS  Take 1,000 Units by mouth daily.    [provider]  [employer] ARTHRITIS PAIN RELIEF 650 MG CR tablet Take 650 mg by mouth every 8 (eight) hours as needed for pain.  12/16/18   [provider]  doxycycline (VIBRAMYCIN) 100 MG capsule Take 100 mg by mouth daily.  11/03/18   [provider]  escitalopram (LEXAPRO) 10 MG tablet Take by mouth. 03/18/19   [provider]  furosemide (LASIX) 20 MG tablet Take by mouth. 03/08/19   [provider]  HYDROcodone-acetaminophen  (NORCO/VICODIN) 5-325 MG tablet Take one tablet once or twice a day as needed for more severe pain 04/12/19   [provider]  hydrOXYzine (ATARAX/VISTARIL) 25 MG tablet Take 1 tablet (25 mg total) by mouth 2 (two) times daily as needed. 01/18/19   Nevada Crane, MD  lactulose (CHRONULAC) 10 GM/15ML solution Take 10 g by mouth daily as needed for moderate constipation.  01/12/19   [provider]  Rolan Lipa 72 MCG capsule  05/18/19   [provider]  lisinopril (PRINIVIL,ZESTRIL) 20 MG tablet Take 20 mg by mouth daily. 10/03/15   [provider]  meloxicam (MOBIC) 7.5 MG tablet Take 7.5 mg by mouth daily. 01/10/19   [provider]  metFORMIN (GLUCOPHAGE) 500 MG tablet TAKE 1 TABLET BY MOUTH DAILY FOR 7 DAYS, THEN TAKE ONE TWICE DAILY WITH MEALS 01/22/19   [provider]  omeprazole (PRILOSEC) 40 MG capsule Take 40 mg by mouth 2 (two) times daily.     [provider]  oxybutynin (DITROPAN) 5 MG tablet Take 5 mg by mouth 2 (two) times daily. 10/19/15   [provider]  potassium chloride (KLOR-CON) 10 MEQ tablet Take by mouth. 04/04/19   [provider]  predniSONE (DELTASONE) 20 MG tablet Take 20 mg by mouth daily. 02/25/19   [provider]  traZODone (DESYREL) 50 MG tablet TAKE 1 TABLET BY MOUTH EVERY DAY AT NIGHT 03/18/19   [provider]  triamcinolone (KENALOG) 0.1 % paste AAA BID-TID PRN 04/01/19   [provider]  trimethoprim (TRIMPEX) 100 MG tablet Take 100 mg by mouth daily.    [provider]    Allergies    Ciprofloxacin  Review of Systems   Review of Systems  Constitutional:  Negative for chills and fever.  HENT: Negative.    Respiratory:  Negative for shortness of breath.   Cardiovascular:  Negative for chest pain.  Gastrointestinal:  Negative for abdominal pain, nausea and vomiting.  Genitourinary:  Positive for dysuria and frequency.  All other systems reviewed and are  negative.  Physical Exam Updated Vital Signs BP (!) 132/113 (BP Location: Right Arm)    Pulse 73    Temp 98.4 F (36.9 C) (Oral)    Resp 20    Ht 5\' 2"  (1.575 m)    Wt 79.4 kg    SpO2 97%    BMI 32.01 kg/m   Physical Exam Vitals and nursing note reviewed.  Constitutional:      Appearance: Normal appearance. She is well-developed. She is obese. She is not ill-appearing.     Comments: Pt is alert and oriented to baseline, appears anxious, but is in no acute distress  HENT:     Head: Normocephalic and atraumatic.  Cardiovascular:     Rate and Rhythm: Normal rate and regular rhythm.     Pulses: Normal pulses.     Heart sounds: Normal heart sounds.  Pulmonary:     Effort:  Pulmonary effort is normal.     Breath sounds: Normal breath sounds.     Comments: Respirations equal and unlabored, patient able to speak in full sentences, lungs clear to auscultation bilaterally , mild tachypnea Abdominal:     General: Bowel sounds are normal. There is no distension.     Palpations: Abdomen is soft. There is no mass.     Tenderness: There is no abdominal tenderness. There is no guarding or rebound.     Comments: Abdomen soft, nondistended, nontender to palpation in all quadrants without guarding or peritoneal signs, no CVA tenderness  Musculoskeletal:        General: No deformity.     Cervical back: Normal range of motion and neck supple. No tenderness.  Skin:    General: Skin is warm and dry.     Capillary Refill: Capillary refill takes less than 2 seconds.  Neurological:     Mental Status: She is alert and oriented to person, place, and time.     Coordination: Coordination normal.     Comments: Speech is clear, able to follow commands CN III-XII intact  Psychiatric:        Mood and Affect: Mood normal.        Behavior: Behavior normal.    ED Results / Procedures / Treatments   Labs (all labs ordered are listed, but only abnormal results are displayed) Labs Reviewed  URINE CULTURE     EKG None  Radiology CT HEAD WO CONTRAST (5MM)  Result Date: 03/30/2021 CLINICAL DATA:  New onset headaches and findings suggestive of UTI EXAM: CT HEAD WITHOUT CONTRAST TECHNIQUE: Contiguous axial images were obtained from the base of the skull through the vertex without intravenous contrast. COMPARISON:  02/15/2021 FINDINGS: Brain: No evidence of acute infarction, hemorrhage, hydrocephalus, extra-axial collection or mass lesion/mass effect. Mild atrophic and chronic white matter ischemic changes are noted. Vascular: No hyperdense vessel or unexpected calcification. Skull: Normal. Negative for fracture or focal lesion. Sinuses/Orbits: No acute finding. Other: None. IMPRESSION: Chronic atrophic and ischemic changes without acute abnormality. Electronically Signed   By: Inez Catalina M.D.   On: 03/30/2021 23:53    Procedures Procedures   Medications Ordered in ED Medications - No data to display  ED Course  I have reviewed the triage vital signs and the nursing notes.  Pertinent labs & imaging results that were available during my care of the patient were reviewed by me and considered in my medical decision making (see chart for details).  Chart review after initial evaluation notable for visit 2 days ago for dysuria, polyuria, frequency as above.  Catheter placed for concern of retention, urinary dysfunction.  Patient started Keflex.  Urine culture from 2 days ago with multiple specimens, additional culture will be sent today after Foley catheter was placed.   11:06 PM Patient awake, alert, smiling.  Foley catheter in place draining clear urine.  Adult female presents several days after initial evaluation for dysuria, now with dislodgment of Foley catheter.  She is otherwise awake, alert, afebrile, speaking clearly, in no distress has a soft, nonperitoneal abdomen.  Patient had replacement of her Foley catheter, additional urine culture sent patient encouraged to follow-up with  physician as scheduled tomorrow, continue taking antibiotics, discharged in stable condition.  Final Clinical Impression(s) / ED Diagnoses Final diagnoses:  Dysuria  Foley catheter problem, initial encounter [facility])     Carmin Muskrat, MD 04/01/21 2306

## 2021-04-03 LAB — URINE CULTURE: Culture: NO GROWTH

## 2021-04-04 ENCOUNTER — Emergency Department (HOSPITAL_COMMUNITY)
Admission: EM | Admit: 2021-04-04 | Discharge: 2021-04-04 | Disposition: A | Discharge status: Home / Self Care | Payer: HMO | Attending: Student | Admitting: Student

## 2021-04-04 ENCOUNTER — Other Ambulatory Visit: Payer: Self-pay

## 2021-04-04 ENCOUNTER — Encounter (HOSPITAL_COMMUNITY): Payer: Self-pay

## 2021-04-04 DIAGNOSIS — Z7984 Long term (current) use of oral hypoglycemic drugs: Secondary | ICD-10-CM | POA: Diagnosis not present

## 2021-04-04 DIAGNOSIS — T83021A Displacement of indwelling urethral catheter, initial encounter: Secondary | ICD-10-CM

## 2021-04-04 DIAGNOSIS — Z7982 Long term (current) use of aspirin: Secondary | ICD-10-CM | POA: Diagnosis not present

## 2021-04-04 DIAGNOSIS — Z79899 Other long term (current) drug therapy: Secondary | ICD-10-CM | POA: Diagnosis not present

## 2021-04-04 DIAGNOSIS — I10 Essential (primary) hypertension: Secondary | ICD-10-CM | POA: Diagnosis not present

## 2021-04-04 DIAGNOSIS — E119 Type 2 diabetes mellitus without complications: Secondary | ICD-10-CM | POA: Diagnosis not present

## 2021-04-04 DIAGNOSIS — Y732 Prosthetic and other implants, materials and accessory gastroenterology and urology devices associated with adverse incidents: Secondary | ICD-10-CM | POA: Diagnosis not present

## 2021-04-04 DIAGNOSIS — T83028A Displacement of other indwelling urethral catheter, initial encounter: Secondary | ICD-10-CM | POA: Diagnosis not present

## 2021-04-04 LAB — CBC WITH DIFFERENTIAL/PLATELET
Abs Immature Granulocytes: 0.03 10*3/uL (ref 0.00–0.07)
Basophils Absolute: 0 10*3/uL (ref 0.0–0.1)
Basophils Relative: 1 %
Eosinophils Absolute: 0.1 10*3/uL (ref 0.0–0.5)
Eosinophils Relative: 1 %
HCT: 35.6 % — ABNORMAL LOW (ref 36.0–46.0)
Hemoglobin: 11.9 g/dL — ABNORMAL LOW (ref 12.0–15.0)
Immature Granulocytes: 0 %
Lymphocytes Relative: 19 %
Lymphs Abs: 1.4 10*3/uL (ref 0.7–4.0)
MCH: 33.9 pg (ref 26.0–34.0)
MCHC: 33.4 g/dL (ref 30.0–36.0)
MCV: 101.4 fL — ABNORMAL HIGH (ref 80.0–100.0)
Monocytes Absolute: 0.9 10*3/uL (ref 0.1–1.0)
Monocytes Relative: 13 %
Neutro Abs: 4.8 10*3/uL (ref 1.7–7.7)
Neutrophils Relative %: 66 %
Platelets: 310 10*3/uL (ref 150–400)
RBC: 3.51 MIL/uL — ABNORMAL LOW (ref 3.87–5.11)
RDW: 13.5 % (ref 11.5–15.5)
WBC: 7.3 10*3/uL (ref 4.0–10.5)
nRBC: 0 % (ref 0.0–0.2)

## 2021-04-04 LAB — URINALYSIS, ROUTINE W REFLEX MICROSCOPIC
Bacteria, UA: NONE SEEN
Bilirubin Urine: NEGATIVE
Glucose, UA: NEGATIVE mg/dL
Hgb urine dipstick: NEGATIVE
Ketones, ur: 5 mg/dL — AB
Leukocytes,Ua: NEGATIVE
Nitrite: NEGATIVE
Protein, ur: 30 mg/dL — AB
Specific Gravity, Urine: 1.038 — ABNORMAL HIGH (ref 1.005–1.030)
pH: 5 (ref 5.0–8.0)

## 2021-04-04 LAB — BASIC METABOLIC PANEL
Anion gap: 9 (ref 5–15)
BUN: 27 mg/dL — ABNORMAL HIGH (ref 8–23)
CO2: 23 mmol/L (ref 22–32)
Calcium: 9 mg/dL (ref 8.9–10.3)
Chloride: 101 mmol/L (ref 98–111)
Creatinine, Ser: 1.01 mg/dL — ABNORMAL HIGH (ref 0.44–1.00)
GFR, Estimated: 56 mL/min — ABNORMAL LOW (ref >60)
Glucose, Bld: 284 mg/dL — ABNORMAL HIGH (ref 70–99)
Potassium: 4.8 mmol/L (ref 3.5–5.1)
Sodium: 133 mmol/L — ABNORMAL LOW (ref 135–145)

## 2021-04-04 NOTE — ED Provider Notes (Signed)
Emergency Medicine Provider Triage Evaluation Note  Victoria Tyler , a 80 y.o. female  was evaluated in triage.  Pt complains of not feeling well.  She pulled her Foley catheter out according to her husband.  Patient is a poor historian cannot tell me much more.  Foley placed, 3 days ago, here in the ED.Marland Kitchen  Review of Systems  Positive: Malaise Negative: Ambulatory  Physical Exam  BP 135/70 (BP Location: Right Arm)    Pulse 76    Temp 98.1 F (36.7 C) (Oral)    Resp 20    Ht 5\' 2"  (1.575 m)    Wt 77.1 kg    SpO2 99%    BMI 31.09 kg/m  Gen:   Awake, no distress   Resp:  Normal effort  MSK:   Moves extremities without difficulty  Other:  Confusion/dementia  Medical Decision Making  Medically screening exam initiated at 11:12 AM.  Appropriate orders placed.  Victoria Tyler was informed that the remainder of the evaluation will be completed by another provider, this initial triage assessment does not replace that evaluation, and the importance of remaining in the ED until their evaluation is complete.  Screening labs, bladder scan   [name], MD 04/04/21 1122

## 2021-04-04 NOTE — ED Notes (Signed)
Bladder scans shows a volume of 25 ml.

## 2021-04-04 NOTE — ED Triage Notes (Signed)
Pt reports her foley catheter fell out today. Pt was seen for the same thing on 12/18.

## 2021-04-04 NOTE — ED Provider Notes (Signed)
[facility] DEPT Provider Note   CSN: [id number] Arrival date & time: 04/04/21  1020     History Chief Complaint  Patient presents with   Catheter Issue    Victoria Tyler is a 80 y.o. female with PMH dementia, diabetes, HTN, urinary retention and overflow incontinence status post urinary catheter placement on 03/30/2021 who presents the emergency department for evaluation of accidental urinary catheter dislodgment.  The patient has been seen recently on 04/01/2021 for similar complaint.  Husband states that the patient's mental status is not different than usual and the patient does have baseline dementia which she is concerned is causing her to forget that the catheter is in place and she is intermittently pulling it out.  Denies complaints of abdominal pain, nausea, vomiting, fever or other systemic symptoms.  Patient has urology follow-up in 9 days.  No vaginal bleeding or evidence of traumatic removal of the catheter.  HPI     Past Medical History:  Diagnosis Date   Diabetes mellitus without complication ([place])    Hypertension     Patient Active Problem List   Diagnosis Date Noted   Chronic cystitis 05/21/2019   Incomplete emptying of bladder 05/21/2019   Insomnia secondary to depression with anxiety 03/18/2019   Leg swelling 03/17/2019   Educated about COVID-19 virus infection 03/17/2019   Cognitive impairment 02/23/2019   Constipation 02/23/2019   History of hypertension 06/24/2018   Unintentional weight loss 11/05/2016   Hypoglycemia 10/30/2015   Fall 10/30/2015   Annual physical exam 07/20/2014   Colitis 04/24/2014   Essential hypertension 04/24/2014   Controlled type 2 diabetes mellitus with microalbuminuria or microproteinuria 04/24/2014   Hypotension 04/24/2014   High anion gap metabolic acidosis 123456   Acute kidney injury ([place]) 04/24/2014   Hyponatremia 04/24/2014   Normocytic anemia 04/24/2014   Infectious colitis  04/24/2014   Arthritis 12/24/2010   Gastroesophageal reflux disease without esophagitis 12/24/2010   History of recurrent UTIs 12/24/2010   Urinary incontinence, functional 12/24/2010    Past Surgical History:  Procedure Laterality Date   HERNIA REPAIR     TONSILLECTOMY       OB History   No obstetric history on file.     Family History  Problem Relation Age of Onset   Heart attack Father    Dementia Neg Hx    Alzheimer's disease Neg Hx     Social History   Tobacco Use   Smoking status: Never   Smokeless tobacco: Never  Vaping Use   Vaping Use: Never used  Substance Use Topics   Alcohol use: No   Drug use: No    Home Medications Prior to Admission medications   Medication Sig Start Date End Date Taking? Authorizing Provider  alendronate (FOSAMAX) 70 MG tablet Take 70 mg by mouth once a week.  03/24/14   [provider]  Ascorbic Acid (VITAMIN C) 1000 MG tablet Take 1,000 mg by mouth daily.    [provider]  aspirin (GOODSENSE ASPIRIN) 81 MG chewable tablet Chew 81 mg by mouth daily.    [provider]  beta carotene w/minerals (OCUVITE) tablet Take 1 tablet by mouth daily.    [provider]  Calcium Carbonate-Vitamin D 600-400 MG-UNIT tablet Take 1 tablet by mouth daily.    [provider]  cephALEXin (KEFLEX) 500 MG capsule Take 1 capsule (500 mg total) by mouth 3 (three) times daily for 7 days. 03/31/21 04/07/21  Jacqlyn Larsen, PA-C  Cholecalciferol (VITAMIN  D3) 1000 units CAPS Take 1,000 Units by mouth daily.    [provider]  [employer]S ARTHRITIS PAIN RELIEF 650 MG CR tablet Take 650 mg by mouth every 8 (eight) hours as needed for pain.  12/16/18   [provider]  doxycycline (VIBRAMYCIN) 100 MG capsule Take 100 mg by mouth daily.  11/03/18   [provider]  escitalopram (LEXAPRO) 10 MG tablet Take by mouth. 03/18/19   [provider]  furosemide (LASIX) 20 MG tablet Take by mouth.  03/08/19   [provider]  HYDROcodone-acetaminophen (NORCO/VICODIN) 5-325 MG tablet Take one tablet once or twice a day as needed for more severe pain 04/12/19   [provider]  hydrOXYzine (ATARAX/VISTARIL) 25 MG tablet Take 1 tablet (25 mg total) by mouth 2 (two) times daily as needed. 01/18/19   [place] Crane, MD  lactulose (CHRONULAC) 10 GM/15ML solution Take 10 g by mouth daily as needed for moderate constipation.  01/12/19   [provider]  Rolan Lipa 72 MCG capsule  05/18/19   [provider]  lisinopril (PRINIVIL,ZESTRIL) 20 MG tablet Take 20 mg by mouth daily. 10/03/15   [provider]  meloxicam (MOBIC) 7.5 MG tablet Take 7.5 mg by mouth daily. 01/10/19   [provider]  metFORMIN (GLUCOPHAGE) 500 MG tablet TAKE 1 TABLET BY MOUTH DAILY FOR 7 DAYS, THEN TAKE ONE TWICE DAILY WITH MEALS 01/22/19   [provider]  omeprazole (PRILOSEC) 40 MG capsule Take 40 mg by mouth 2 (two) times daily.     [provider]  oxybutynin (DITROPAN) 5 MG tablet Take 5 mg by mouth 2 (two) times daily. 10/19/15   [provider]  potassium chloride (KLOR-CON) 10 MEQ tablet Take by mouth. 04/04/19   [provider]  predniSONE (DELTASONE) 20 MG tablet Take 20 mg by mouth daily. 02/25/19   [provider]  traZODone (DESYREL) 50 MG tablet TAKE 1 TABLET BY MOUTH EVERY DAY AT NIGHT 03/18/19   [provider]  triamcinolone (KENALOG) 0.1 % paste AAA BID-TID PRN 04/01/19   [provider]  trimethoprim (TRIMPEX) 100 MG tablet Take 100 mg by mouth daily.    [provider]    Allergies    Ciprofloxacin  Review of Systems   Review of Systems  Constitutional:  Negative for chills and fever.  HENT:  Negative for ear pain and sore throat.   Eyes:  Negative for pain and visual disturbance.  Respiratory:  Negative for cough and shortness of breath.   Cardiovascular:  Negative for chest pain and  palpitations.  Gastrointestinal:  Negative for abdominal pain and vomiting.  Genitourinary:  Negative for dysuria and hematuria.  Musculoskeletal:  Negative for arthralgias and back pain.  Skin:  Negative for color change and rash.  Neurological:  Negative for seizures and syncope.  All other systems reviewed and are negative.  Physical Exam Updated Vital Signs BP (!) 128/118    Pulse (!) 54    Temp 98.1 F (36.7 C) (Oral)    Resp 19    Ht 5\' 2"  (1.575 m)    Wt 77.1 kg    SpO2 100%    BMI 31.09 kg/m   Physical Exam Vitals and nursing note reviewed.  Constitutional:      General: She is not in acute distress.    Appearance: She is well-developed.  HENT:     Head: Normocephalic and atraumatic.  Eyes:     Conjunctiva/sclera: Conjunctivae normal.  Cardiovascular:  Rate and Rhythm: Normal rate and regular rhythm.     Heart sounds: No murmur heard. Pulmonary:     Effort: Pulmonary effort is normal. No respiratory distress.     Breath sounds: Normal breath sounds.  Abdominal:     Palpations: Abdomen is soft.     Tenderness: There is no abdominal tenderness.  Musculoskeletal:        General: No swelling.     Cervical back: Neck supple.  Skin:    General: Skin is warm and dry.     Capillary Refill: Capillary refill takes less than 2 seconds.  Neurological:     Mental Status: She is alert.  Psychiatric:        Mood and Affect: Mood normal.    ED Results / Procedures / Treatments   Labs (all labs ordered are listed, but only abnormal results are displayed) Labs Reviewed  CBC WITH DIFFERENTIAL/PLATELET - Abnormal; Notable for the following components:      Result Value   RBC 3.51 (*)    Hemoglobin 11.9 (*)    HCT 35.6 (*)    MCV 101.4 (*)    All other components within normal limits  BASIC METABOLIC PANEL - Abnormal; Notable for the following components:   Sodium 133 (*)    Glucose, Bld 284 (*)    BUN 27 (*)    Creatinine, Ser 1.01 (*)    GFR, Estimated 56 (*)     All other components within normal limits  URINALYSIS, ROUTINE W REFLEX MICROSCOPIC    EKG None  Radiology No results found.  Procedures Procedures   Medications Ordered in ED Medications - No data to display  ED Course  I have reviewed the triage vital signs and the nursing notes.  Pertinent labs & imaging results that were available during my care of the patient were reviewed by me and considered in my medical decision making (see chart for details).    MDM Rules/Calculators/A&P                          Patient seen emergency room for evaluation of accidental catheter removal.  Physical exam is unremarkable.  There is no blood in the perineum or evidence of traumatic catheter removal with balloon inflation.  The catheter was replaced without difficulty or resistance leading to removal of approximately 250 cc of urine.  Laboratory evaluation unremarkable.  I spoke with urology about the patient's frequent removal of the catheter and they recommended inflating the balloon an additional 10 cc to a total of 20 cc to prevent accidental removal.  She is safe for outpatient follow-up at this time and on reevaluation is hemodynamically stable.  She is then discharged in the care of her husband.  Of note.  Husband stating that it is beginning to get more difficult to care for his wife at home.  He states that he currently has a resources to do so but I think that he may benefit from home health evaluation and coordination with his primary care physician.  He was instructed to call his primary care physician to discuss this topic.   Final Clinical Impression(s) / ED Diagnoses Final diagnoses:  None    Rx / DC Orders ED Discharge Orders     None        Ramces Shomaker, Wyn Forster, MD 04/04/21 [phone]

## 2021-04-04 NOTE — ED Notes (Signed)
Pt ambulatory to restroom. Pt did not provide urine sample at this time.

## 2021-04-04 NOTE — Discharge Instructions (Addendum)
You are seen the emergency department for evaluation of a dislodged Foley catheter.  There is not appear to be in a trauma from the removal of this catheter and a Foley was placed successfully here in the emergency department.  Please call your primary care physician to discuss possible home health resources for managing her care at home.  I spoke with the urologist today he states that he is unable to facilitate expedited evaluation due to low staffing around the holidays and is requesting that you keep your appointment for next Friday.  Today we inflated the balloon to a bigger size to try and prevent accidental dislodgment.  Please return to the emergency department if she removed the catheter despite this effort or if you notice bleeding from the vagina.  At this time she is safe for discharge and will be important to call your primary care physician today.  Please stop taking oxybutynin at this time.

## 2021-04-15 ENCOUNTER — Other Ambulatory Visit: Payer: Self-pay

## 2021-04-15 ENCOUNTER — Encounter (HOSPITAL_COMMUNITY): Payer: Self-pay

## 2021-04-15 ENCOUNTER — Emergency Department (HOSPITAL_COMMUNITY)
Admission: EM | Admit: 2021-04-15 | Discharge: 2021-04-15 | Disposition: A | Discharge status: Home / Self Care | Payer: HMO | Attending: Emergency Medicine | Admitting: Emergency Medicine

## 2021-04-15 DIAGNOSIS — T83021A Displacement of indwelling urethral catheter, initial encounter: Secondary | ICD-10-CM

## 2021-04-15 DIAGNOSIS — Z7984 Long term (current) use of oral hypoglycemic drugs: Secondary | ICD-10-CM | POA: Insufficient documentation

## 2021-04-15 DIAGNOSIS — Y846 Urinary catheterization as the cause of abnormal reaction of the patient, or of later complication, without mention of misadventure at the time of the procedure: Secondary | ICD-10-CM | POA: Insufficient documentation

## 2021-04-15 DIAGNOSIS — Z7982 Long term (current) use of aspirin: Secondary | ICD-10-CM | POA: Diagnosis not present

## 2021-04-15 DIAGNOSIS — T83098A Other mechanical complication of other indwelling urethral catheter, initial encounter: Secondary | ICD-10-CM | POA: Insufficient documentation

## 2021-04-15 NOTE — Discharge Instructions (Signed)
Please follow up with urologist next week to reassess your urinary retention.

## 2021-04-15 NOTE — ED Triage Notes (Signed)
Patient states she forgets she has a catheter and pulls her foley cath out. Patient states she did so aprox 10 minutes ago. Patient denies any bleeding.

## 2021-04-15 NOTE — ED Provider Notes (Signed)
[facility]-EMERGENCY DEPT Provider Note   CSN: [id number] Arrival date & time: 04/15/21  1457     History  Chief Complaint  Patient presents with   catheter issue    Victoria Tyler is a 81 y.o. female.  The history is provided by the patient, the spouse and medical records. No language interpreter was used.   81 year old female presenting for evaluation of catheter complication.  Patient's foley was caught on her husband's table and it got yanked out.  Incident happened approximately 10 minutes ago.  She denies any significant pain no other complaint.  Home Medications Prior to Admission medications   Medication Sig Start Date End Date Taking? Authorizing Provider  alendronate (FOSAMAX) 70 MG tablet Take 70 mg by mouth once a week.  03/24/14   [provider]  Ascorbic Acid (VITAMIN C) 1000 MG tablet Take 1,000 mg by mouth daily.    [provider]  aspirin (GOODSENSE ASPIRIN) 81 MG chewable tablet Chew 81 mg by mouth daily.    [provider]  beta carotene w/minerals (OCUVITE) tablet Take 1 tablet by mouth daily.    [provider]  Calcium Carbonate-Vitamin D 600-400 MG-UNIT tablet Take 1 tablet by mouth daily.    [provider]  Cholecalciferol (VITAMIN D3) 1000 units CAPS Take 1,000 Units by mouth daily.    [provider]  [employer] ARTHRITIS PAIN RELIEF 650 MG CR tablet Take 650 mg by mouth every 8 (eight) hours as needed for pain.  12/16/18   [provider]  doxycycline (VIBRAMYCIN) 100 MG capsule Take 100 mg by mouth daily.  11/03/18   [provider]  escitalopram (LEXAPRO) 10 MG tablet Take by mouth. 03/18/19   [provider]  furosemide (LASIX) 20 MG tablet Take by mouth. 03/08/19   [provider]  HYDROcodone-acetaminophen (NORCO/VICODIN) 5-325 MG tablet Take one tablet once or twice a day as needed for more severe pain 04/12/19   [provider]  hydrOXYzine  (ATARAX/VISTARIL) 25 MG tablet Take 1 tablet (25 mg total) by mouth 2 (two) times daily as needed. 01/18/19   Zena Amos, MD  lactulose (CHRONULAC) 10 GM/15ML solution Take 10 g by mouth daily as needed for moderate constipation.  01/12/19   [provider]  Karlene Einstein 72 MCG capsule  05/18/19   [provider]  lisinopril (PRINIVIL,ZESTRIL) 20 MG tablet Take 20 mg by mouth daily. 10/03/15   [provider]  meloxicam (MOBIC) 7.5 MG tablet Take 7.5 mg by mouth daily. 01/10/19   [provider]  metFORMIN (GLUCOPHAGE) 500 MG tablet TAKE 1 TABLET BY MOUTH DAILY FOR 7 DAYS, THEN TAKE ONE TWICE DAILY WITH MEALS 01/22/19   [provider]  omeprazole (PRILOSEC) 40 MG capsule Take 40 mg by mouth 2 (two) times daily.     [provider]  oxybutynin (DITROPAN) 5 MG tablet Take 5 mg by mouth 2 (two) times daily. 10/19/15   [provider]  potassium chloride (KLOR-CON) 10 MEQ tablet Take by mouth. 04/04/19   [provider]  predniSONE (DELTASONE) 20 MG tablet Take 20 mg by mouth daily. 02/25/19   [provider]  traZODone (DESYREL) 50 MG tablet TAKE 1 TABLET BY MOUTH EVERY DAY AT NIGHT 03/18/19   [provider]  triamcinolone (KENALOG) 0.1 % paste AAA BID-TID PRN 04/01/19   [provider]  trimethoprim (TRIMPEX) 100 MG tablet Take 100 mg by mouth daily.    [provider]  Allergies    Ciprofloxacin    Review of Systems   Review of Systems  Genitourinary:  Negative for dysuria.   Physical Exam Updated Vital Signs BP (!) 175/92 (BP Location: Left Arm)    Pulse 85    Temp 98.1 F (36.7 C) (Oral)    Resp 20    Ht 5\' 2"  (1.575 m)    Wt 77.1 kg    SpO2 93%    BMI 31.09 kg/m  Physical Exam Vitals and nursing note reviewed.  Constitutional:      General: She is not in acute distress.    Appearance: She is well-developed.  HENT:     Head: Atraumatic.  Eyes:     Conjunctiva/sclera: Conjunctivae  normal.  Pulmonary:     Effort: Pulmonary effort is normal.  Genitourinary:    Comments: Chaperone present during exam.  No vaginal tear no bleeding.  No urethral trauma. Musculoskeletal:     Cervical back: Neck supple.  Skin:    Findings: No rash.  Neurological:     Mental Status: She is alert.  Psychiatric:        Mood and Affect: Mood normal.    ED Results / Procedures / Treatments   Labs (all labs ordered are listed, but only abnormal results are displayed) Labs Reviewed - No data to display  EKG None  Radiology No results found.  Procedures Procedures    Medications Ordered in ED Medications - No data to display  ED Course/ Medical Decision Making/ A&P                           Medical Decision Making  BP (!) 175/92 (BP Location: Left Arm)    Pulse 85    Temp 98.1 F (36.7 C) (Oral)    Resp 20    Ht 5\' 2"  (1.575 m)    Wt 77.1 kg    SpO2 93%    BMI 31.09 kg/m   3:56 PM Patient accidentally pulled her indwelling Foley prior to arrival.  No significant signs of injury noted on exam.  Will have Foley replaced and patient can follow-up with urology for further care.  I have reviewed patient's previous notes, she had the Foley placed on 12/16 for acute urinary retention.  4:23 PM Foley replaced, pt will f/u with urology for further management.  Care discussed with DR. Wentz.         Final Clinical Impression(s) / ED Diagnoses Final diagnoses:  Dislodged Foley catheter [facility])    Rx / DC Orders ED Discharge Orders     None         1/17, PA-C 04/15/21 1623    Fayrene Helper, MD 04/15/21 1949

## 2021-04-15 NOTE — ED Provider Notes (Signed)
°  Face-to-face evaluation   History: Patient with dementia, presenting for dislodged urinary catheter.  She cannot specify what happened.  Physical exam: Elderly alert obese female.  She is cooperative and comfortable.  Abdomen soft and nontender to palpation.  MDM: Evaluation for  Chief Complaint  Patient presents with   catheter issue     Patient with dementia has pulled her Foley catheter out.  She has a chronic Foley catheter.  Similar dislodgment, 04/04/2021.  No overt problems requiring intervention.  Medical screening examination/treatment/procedure(s) were conducted as a shared visit with non-physician practitioner(s) and myself.  I personally evaluated the patient during the encounter    Mancel Bale, MD 04/15/21 1949

## 2021-07-07 ENCOUNTER — Observation Stay (HOSPITAL_COMMUNITY)
Admission: EM | Admit: 2021-07-07 | Discharge: 2021-07-09 | Disposition: A | Discharge status: Skilled Nursing Facility | Payer: HMO | Attending: Internal Medicine | Admitting: Internal Medicine

## 2021-07-07 ENCOUNTER — Other Ambulatory Visit: Payer: Self-pay

## 2021-07-07 ENCOUNTER — Encounter (HOSPITAL_COMMUNITY): Payer: Self-pay | Admitting: Emergency Medicine

## 2021-07-07 ENCOUNTER — Emergency Department (HOSPITAL_COMMUNITY): Payer: HMO

## 2021-07-07 DIAGNOSIS — M5137 Other intervertebral disc degeneration, lumbosacral region: Secondary | ICD-10-CM | POA: Insufficient documentation

## 2021-07-07 DIAGNOSIS — M47817 Spondylosis without myelopathy or radiculopathy, lumbosacral region: Secondary | ICD-10-CM | POA: Diagnosis not present

## 2021-07-07 DIAGNOSIS — E1110 Type 2 diabetes mellitus with ketoacidosis without coma: Secondary | ICD-10-CM | POA: Diagnosis not present

## 2021-07-07 DIAGNOSIS — M549 Dorsalgia, unspecified: Secondary | ICD-10-CM | POA: Diagnosis not present

## 2021-07-07 DIAGNOSIS — S4992XA Unspecified injury of left shoulder and upper arm, initial encounter: Secondary | ICD-10-CM | POA: Diagnosis present

## 2021-07-07 DIAGNOSIS — R944 Abnormal results of kidney function studies: Secondary | ICD-10-CM | POA: Insufficient documentation

## 2021-07-07 DIAGNOSIS — M4316 Spondylolisthesis, lumbar region: Secondary | ICD-10-CM | POA: Insufficient documentation

## 2021-07-07 DIAGNOSIS — E1165 Type 2 diabetes mellitus with hyperglycemia: Secondary | ICD-10-CM | POA: Insufficient documentation

## 2021-07-07 DIAGNOSIS — S060XAA Concussion with loss of consciousness status unknown, initial encounter: Secondary | ICD-10-CM | POA: Diagnosis not present

## 2021-07-07 DIAGNOSIS — M4317 Spondylolisthesis, lumbosacral region: Secondary | ICD-10-CM | POA: Insufficient documentation

## 2021-07-07 DIAGNOSIS — Y92009 Unspecified place in unspecified non-institutional (private) residence as the place of occurrence of the external cause: Secondary | ICD-10-CM | POA: Insufficient documentation

## 2021-07-07 DIAGNOSIS — M954 Acquired deformity of chest and rib: Secondary | ICD-10-CM | POA: Insufficient documentation

## 2021-07-07 DIAGNOSIS — S060X9A Concussion with loss of consciousness of unspecified duration, initial encounter: Secondary | ICD-10-CM | POA: Diagnosis not present

## 2021-07-07 DIAGNOSIS — W1830XA Fall on same level, unspecified, initial encounter: Secondary | ICD-10-CM | POA: Insufficient documentation

## 2021-07-07 DIAGNOSIS — Z66 Do not resuscitate: Secondary | ICD-10-CM | POA: Diagnosis not present

## 2021-07-07 DIAGNOSIS — S0181XA Laceration without foreign body of other part of head, initial encounter: Secondary | ICD-10-CM | POA: Insufficient documentation

## 2021-07-07 DIAGNOSIS — Y92007 Garden or yard of unspecified non-institutional (private) residence as the place of occurrence of the external cause: Secondary | ICD-10-CM | POA: Diagnosis not present

## 2021-07-07 DIAGNOSIS — F0390 Unspecified dementia without behavioral disturbance: Secondary | ICD-10-CM | POA: Diagnosis not present

## 2021-07-07 DIAGNOSIS — R2689 Other abnormalities of gait and mobility: Secondary | ICD-10-CM | POA: Insufficient documentation

## 2021-07-07 DIAGNOSIS — M5136 Other intervertebral disc degeneration, lumbar region: Secondary | ICD-10-CM | POA: Insufficient documentation

## 2021-07-07 DIAGNOSIS — I10 Essential (primary) hypertension: Secondary | ICD-10-CM | POA: Insufficient documentation

## 2021-07-07 DIAGNOSIS — M6281 Muscle weakness (generalized): Secondary | ICD-10-CM | POA: Insufficient documentation

## 2021-07-07 DIAGNOSIS — M25512 Pain in left shoulder: Secondary | ICD-10-CM | POA: Insufficient documentation

## 2021-07-07 DIAGNOSIS — S42292A Other displaced fracture of upper end of left humerus, initial encounter for closed fracture: Principal | ICD-10-CM | POA: Insufficient documentation

## 2021-07-07 DIAGNOSIS — Z79899 Other long term (current) drug therapy: Secondary | ICD-10-CM | POA: Insufficient documentation

## 2021-07-07 DIAGNOSIS — Z23 Encounter for immunization: Secondary | ICD-10-CM | POA: Diagnosis not present

## 2021-07-07 DIAGNOSIS — R451 Restlessness and agitation: Secondary | ICD-10-CM | POA: Insufficient documentation

## 2021-07-07 DIAGNOSIS — Z8249 Family history of ischemic heart disease and other diseases of the circulatory system: Secondary | ICD-10-CM | POA: Insufficient documentation

## 2021-07-07 DIAGNOSIS — I491 Atrial premature depolarization: Secondary | ICD-10-CM | POA: Insufficient documentation

## 2021-07-07 DIAGNOSIS — Z7982 Long term (current) use of aspirin: Secondary | ICD-10-CM | POA: Diagnosis not present

## 2021-07-07 DIAGNOSIS — R9082 White matter disease, unspecified: Secondary | ICD-10-CM | POA: Insufficient documentation

## 2021-07-07 DIAGNOSIS — M47816 Spondylosis without myelopathy or radiculopathy, lumbar region: Secondary | ICD-10-CM | POA: Insufficient documentation

## 2021-07-07 DIAGNOSIS — Y9301 Activity, walking, marching and hiking: Secondary | ICD-10-CM | POA: Insufficient documentation

## 2021-07-07 DIAGNOSIS — E872 Acidosis, unspecified: Secondary | ICD-10-CM

## 2021-07-07 DIAGNOSIS — W19XXXA Unspecified fall, initial encounter: Secondary | ICD-10-CM | POA: Diagnosis present

## 2021-07-07 DIAGNOSIS — I493 Ventricular premature depolarization: Secondary | ICD-10-CM | POA: Insufficient documentation

## 2021-07-07 DIAGNOSIS — M8580 Other specified disorders of bone density and structure, unspecified site: Secondary | ICD-10-CM | POA: Insufficient documentation

## 2021-07-07 DIAGNOSIS — S0512XA Contusion of eyeball and orbital tissues, left eye, initial encounter: Secondary | ICD-10-CM | POA: Insufficient documentation

## 2021-07-07 DIAGNOSIS — R0682 Tachypnea, not elsewhere classified: Secondary | ICD-10-CM | POA: Insufficient documentation

## 2021-07-07 DIAGNOSIS — H02844 Edema of left upper eyelid: Secondary | ICD-10-CM | POA: Insufficient documentation

## 2021-07-07 DIAGNOSIS — R2681 Unsteadiness on feet: Secondary | ICD-10-CM | POA: Insufficient documentation

## 2021-07-07 DIAGNOSIS — Z7984 Long term (current) use of oral hypoglycemic drugs: Secondary | ICD-10-CM | POA: Insufficient documentation

## 2021-07-07 DIAGNOSIS — M79641 Pain in right hand: Secondary | ICD-10-CM | POA: Insufficient documentation

## 2021-07-07 DIAGNOSIS — R519 Headache, unspecified: Secondary | ICD-10-CM | POA: Insufficient documentation

## 2021-07-07 DIAGNOSIS — M20001 Unspecified deformity of right finger(s): Secondary | ICD-10-CM | POA: Insufficient documentation

## 2021-07-07 DIAGNOSIS — R55 Syncope and collapse: Secondary | ICD-10-CM | POA: Insufficient documentation

## 2021-07-07 DIAGNOSIS — S0990XA Unspecified injury of head, initial encounter: Secondary | ICD-10-CM

## 2021-07-07 DIAGNOSIS — S42202A Unspecified fracture of upper end of left humerus, initial encounter for closed fracture: Secondary | ICD-10-CM

## 2021-07-07 DIAGNOSIS — R9431 Abnormal electrocardiogram [ECG] [EKG]: Secondary | ICD-10-CM | POA: Insufficient documentation

## 2021-07-07 DIAGNOSIS — N179 Acute kidney failure, unspecified: Secondary | ICD-10-CM

## 2021-07-07 DIAGNOSIS — Z9181 History of falling: Secondary | ICD-10-CM | POA: Insufficient documentation

## 2021-07-07 LAB — CBG MONITORING, ED: Glucose-Capillary: 436 mg/dL — ABNORMAL HIGH (ref 70–99)

## 2021-07-07 LAB — URINALYSIS, ROUTINE W REFLEX MICROSCOPIC
Bacteria, UA: NONE SEEN
Bilirubin Urine: NEGATIVE
Glucose, UA: >=500 mg/dL — AB
Hgb urine dipstick: NEGATIVE
Ketones, ur: 20 mg/dL — AB
Leukocytes,Ua: NEGATIVE
Nitrite: NEGATIVE
Protein, ur: NEGATIVE mg/dL
Specific Gravity, Urine: 1.026 (ref 1.005–1.030)
pH: 6 (ref 5.0–8.0)

## 2021-07-07 LAB — HEPATIC FUNCTION PANEL
ALT: 28 U/L (ref 0–44)
AST: 35 U/L (ref 15–41)
Albumin: 3.7 g/dL (ref 3.5–5.0)
Alkaline Phosphatase: 93 U/L (ref 38–126)
Bilirubin, Direct: 0.2 mg/dL (ref 0.0–0.2)
Indirect Bilirubin: 1.3 mg/dL — ABNORMAL HIGH (ref 0.3–0.9)
Total Bilirubin: 1.5 mg/dL — ABNORMAL HIGH (ref 0.3–1.2)
Total Protein: 6.7 g/dL (ref 6.5–8.1)

## 2021-07-07 LAB — CBC
HCT: 39.6 % (ref 36.0–46.0)
Hemoglobin: 13.6 g/dL (ref 12.0–15.0)
MCH: 31.6 pg (ref 26.0–34.0)
MCHC: 34.3 g/dL (ref 30.0–36.0)
MCV: 91.9 fL (ref 80.0–100.0)
Platelets: 297 10*3/uL (ref 150–400)
RBC: 4.31 MIL/uL (ref 3.87–5.11)
RDW: 12.3 % (ref 11.5–15.5)
WBC: 11.1 10*3/uL — ABNORMAL HIGH (ref 4.0–10.5)
nRBC: 0 % (ref 0.0–0.2)

## 2021-07-07 LAB — BASIC METABOLIC PANEL
Anion gap: 16 — ABNORMAL HIGH (ref 5–15)
Anion gap: 18 — ABNORMAL HIGH (ref 5–15)
Anion gap: 16 — ABNORMAL HIGH (ref 5–15)
BUN: 44 mg/dL — ABNORMAL HIGH (ref 8–23)
BUN: 38 mg/dL — ABNORMAL HIGH (ref 8–23)
BUN: 37 mg/dL — ABNORMAL HIGH (ref 8–23)
CO2: 19 mmol/L — ABNORMAL LOW (ref 22–32)
CO2: 18 mmol/L — ABNORMAL LOW (ref 22–32)
CO2: 17 mmol/L — ABNORMAL LOW (ref 22–32)
Calcium: 9.4 mg/dL (ref 8.9–10.3)
Calcium: 9.2 mg/dL (ref 8.9–10.3)
Calcium: 9.3 mg/dL (ref 8.9–10.3)
Chloride: 96 mmol/L — ABNORMAL LOW (ref 98–111)
Chloride: 98 mmol/L (ref 98–111)
Chloride: 104 mmol/L (ref 98–111)
Creatinine, Ser: 1.08 mg/dL — ABNORMAL HIGH (ref 0.44–1.00)
Creatinine, Ser: 1.04 mg/dL — ABNORMAL HIGH (ref 0.44–1.00)
Creatinine, Ser: 0.97 mg/dL (ref 0.44–1.00)
GFR, Estimated: 52 mL/min — ABNORMAL LOW (ref >60)
GFR, Estimated: 54 mL/min — ABNORMAL LOW (ref >60)
GFR, Estimated: 59 mL/min — ABNORMAL LOW (ref >60)
Glucose, Bld: 474 mg/dL — ABNORMAL HIGH (ref 70–99)
Glucose, Bld: 381 mg/dL — ABNORMAL HIGH (ref 70–99)
Glucose, Bld: 365 mg/dL — ABNORMAL HIGH (ref 70–99)
Potassium: 4.7 mmol/L (ref 3.5–5.1)
Potassium: 3.6 mmol/L (ref 3.5–5.1)
Potassium: 4.5 mmol/L (ref 3.5–5.1)
Sodium: 131 mmol/L — ABNORMAL LOW (ref 135–145)
Sodium: 134 mmol/L — ABNORMAL LOW (ref 135–145)
Sodium: 137 mmol/L (ref 135–145)

## 2021-07-07 LAB — GLUCOSE, CAPILLARY
Glucose-Capillary: 369 mg/dL — ABNORMAL HIGH (ref 70–99)
Glucose-Capillary: 328 mg/dL — ABNORMAL HIGH (ref 70–99)
Glucose-Capillary: 364 mg/dL — ABNORMAL HIGH (ref 70–99)
Glucose-Capillary: 195 mg/dL — ABNORMAL HIGH (ref 70–99)

## 2021-07-07 LAB — LACTIC ACID, PLASMA
Lactic Acid, Venous: 3.3 mmol/L (ref 0.5–1.9)
Lactic Acid, Venous: 4 mmol/L (ref 0.5–1.9)

## 2021-07-07 LAB — TSH: TSH: 5.603 u[IU]/mL — ABNORMAL HIGH (ref 0.350–4.500)

## 2021-07-07 LAB — BETA-HYDROXYBUTYRIC ACID: Beta-Hydroxybutyric Acid: 1.01 mmol/L — ABNORMAL HIGH (ref 0.05–0.27)

## 2021-07-07 LAB — VITAMIN B12: Vitamin B-12: 851 pg/mL (ref 180–914)

## 2021-07-07 LAB — VITAMIN D 25 HYDROXY (VIT D DEFICIENCY, FRACTURES): Vit D, 25-Hydroxy: 52.78 ng/mL (ref 30–100)

## 2021-07-07 IMAGING — CR DG THORACIC SPINE 2V
3 series · 3 of 3 positions shown · non-contrast
Comparison: Chest radiograph dated [DATE].

CLINICAL DATA: Fall with back pain.

EXAM:
THORACIC SPINE 2 VIEWS

[t-spine ap]
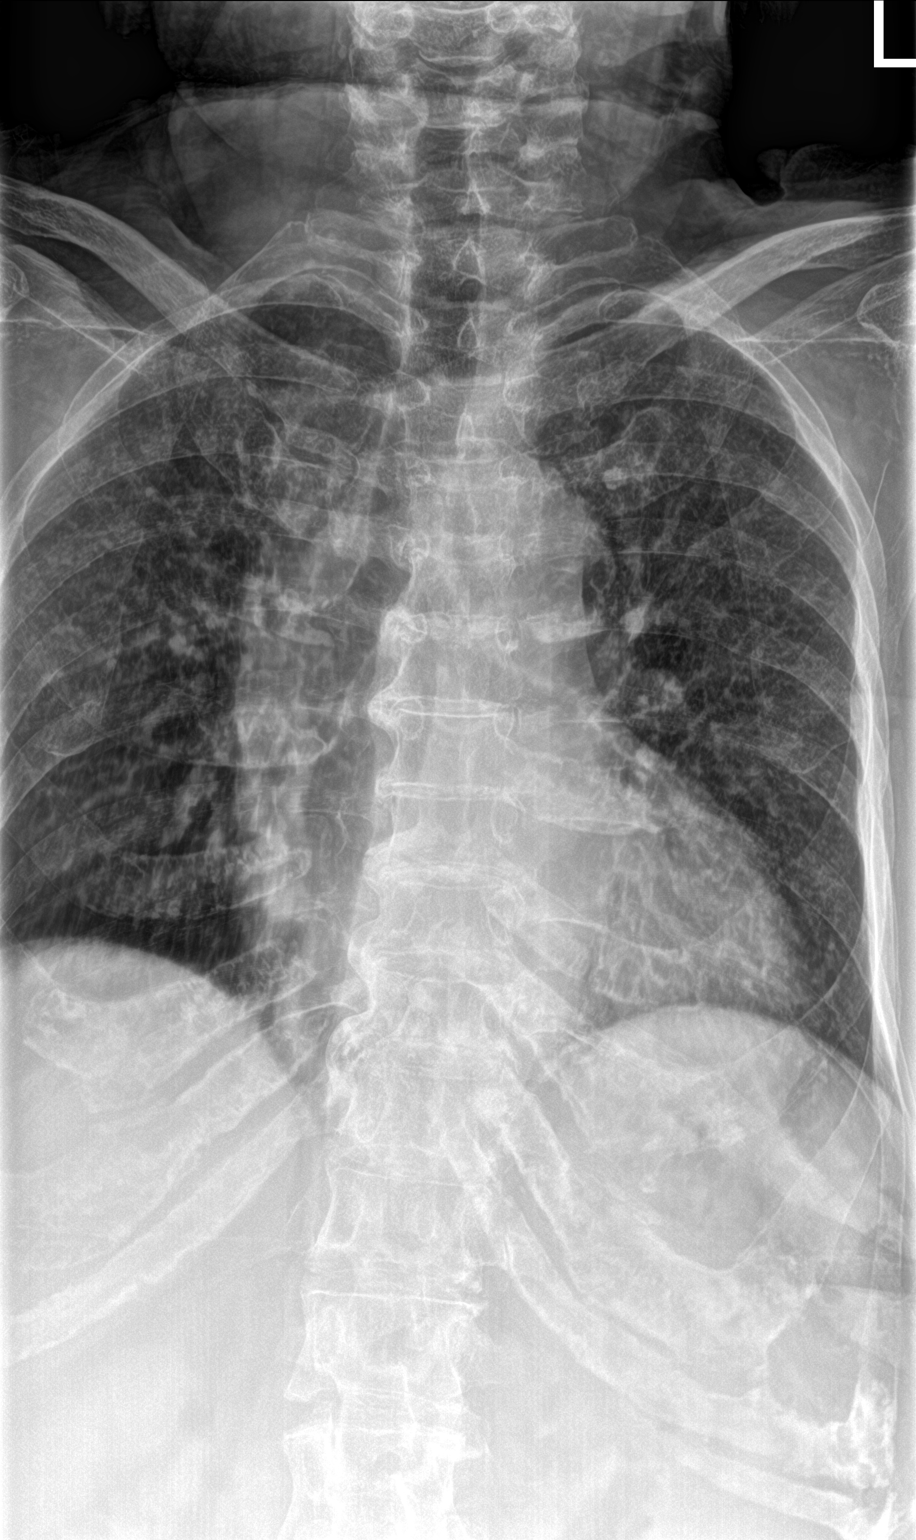

[t-spine lat]
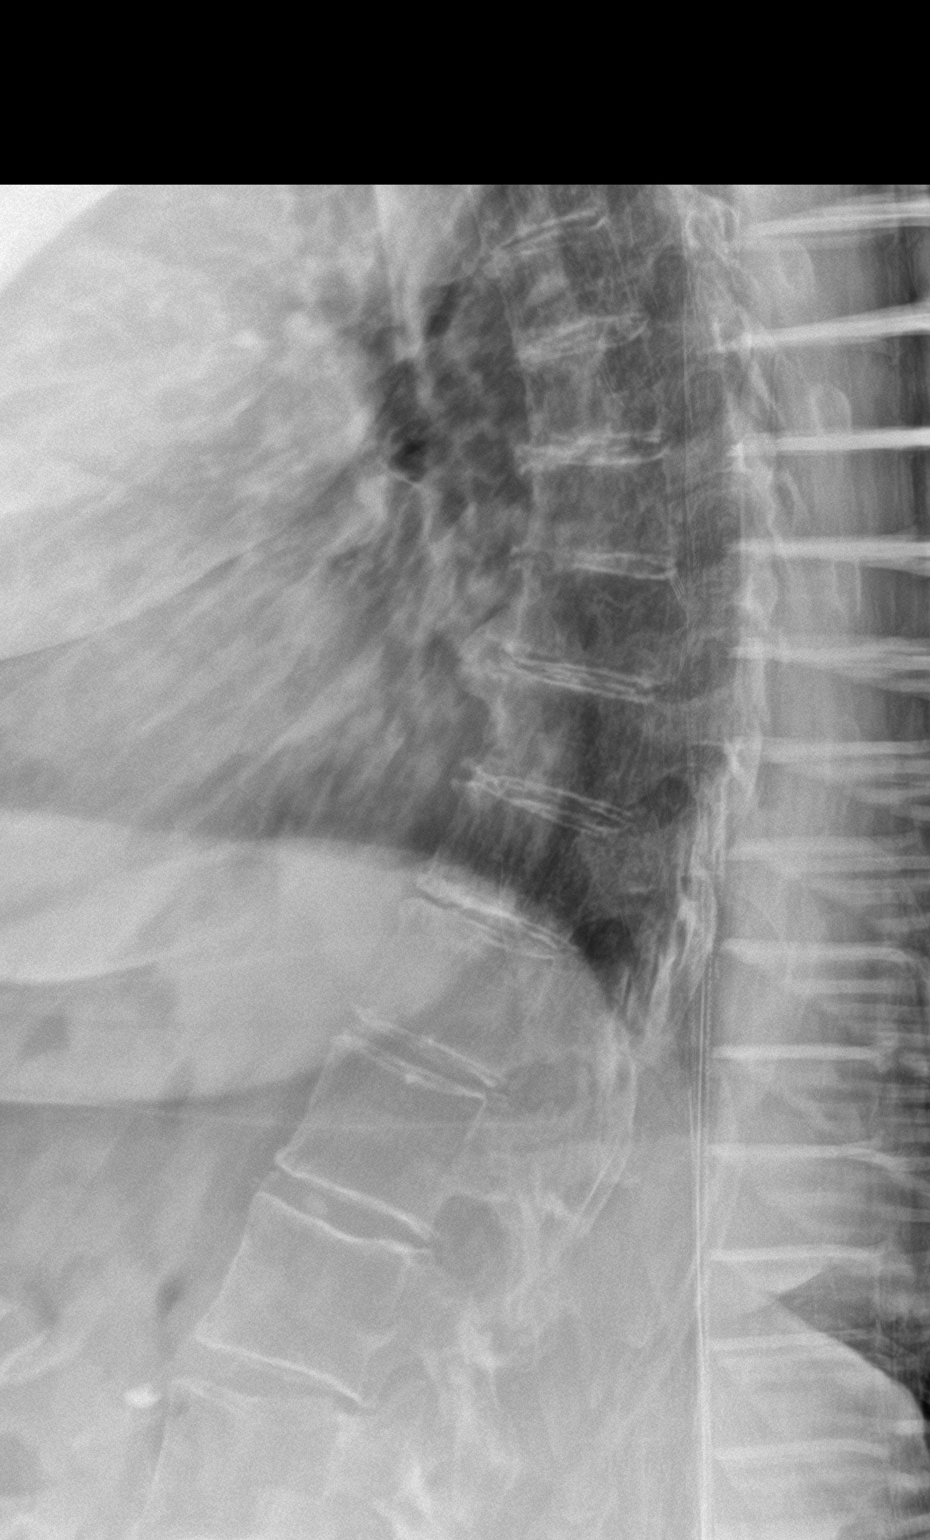

[t-spine swimmers]
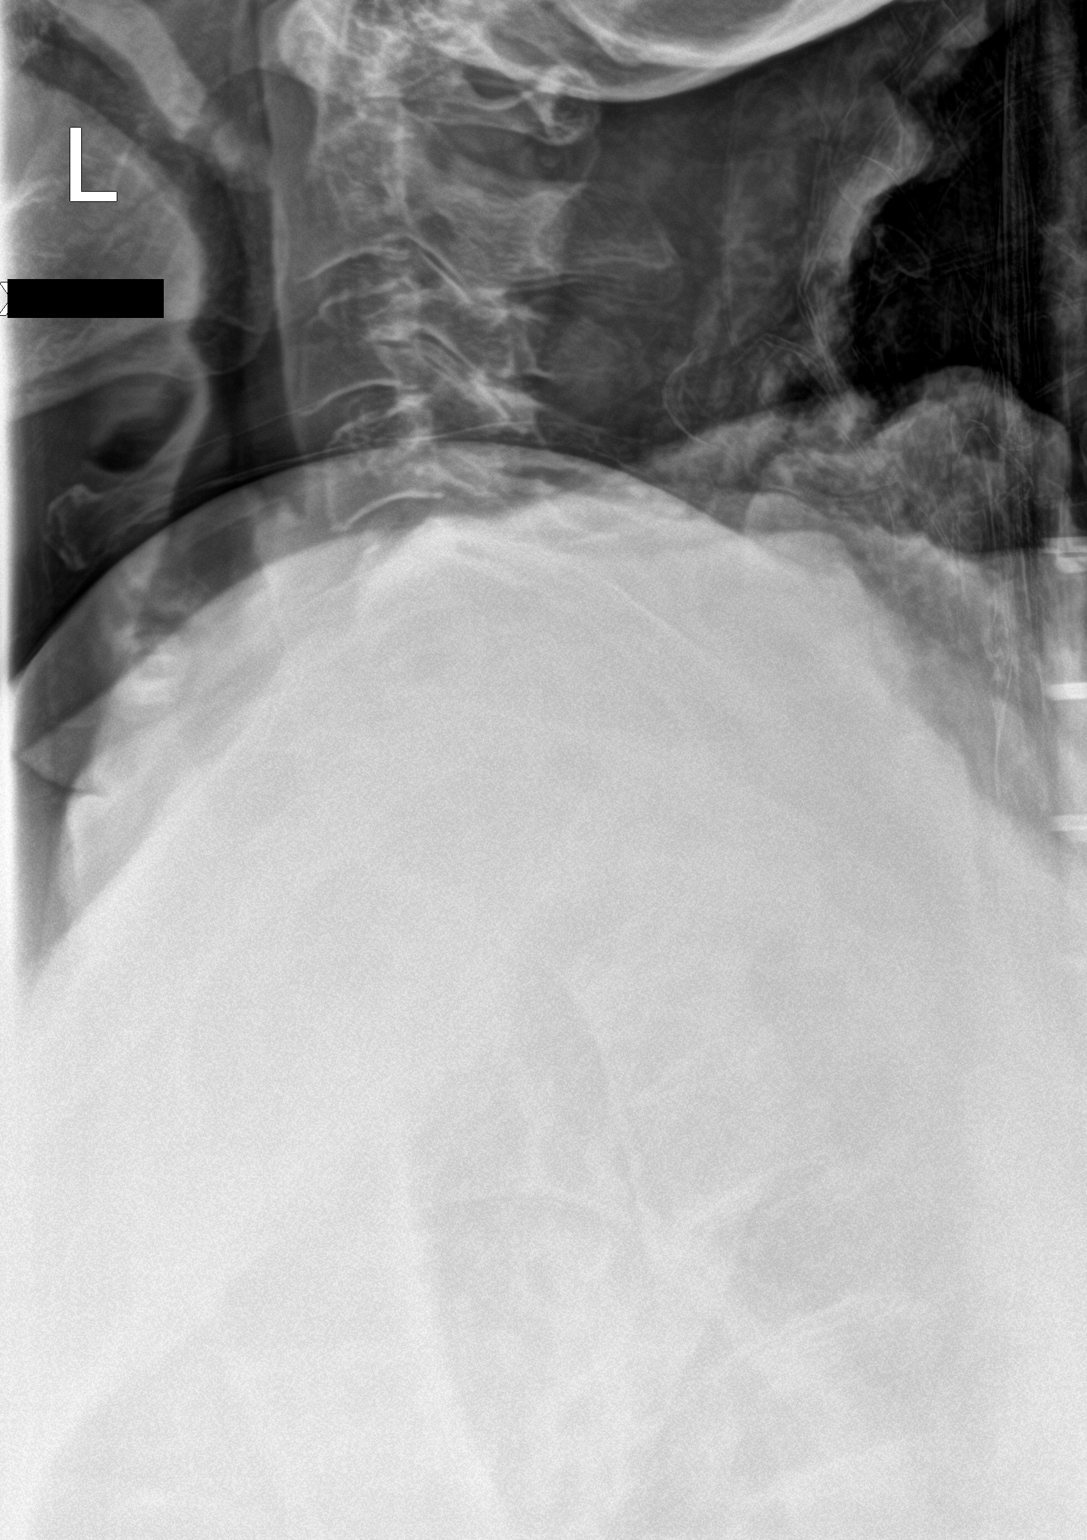

[3 of 3 positions shown; findings below may reference images not displayed]

FINDINGS: There is no evidence of thoracic spine fracture. Alignment is
normal. Deformity of the posterolateral left fourth through sixth
ribs is chronic. Degenerative changes are seen in the spine.
IMPRESSION: No acute osseous injury.

## 2021-07-07 IMAGING — CT CT MAXILLOFACIAL W/O CM
4 series · 16 of 47 positions shown, 18 images · non-contrast
Comparison: [DATE]

CLINICAL DATA: Fall, head trauma, left forehead laceration



[Series 4: facial/ orbits 2.0 h30s · axial · 0.37mm/px · z∈[-143,+5]mm · 8 of 96 slices shown, 10 images]
[im 11/96  brain]
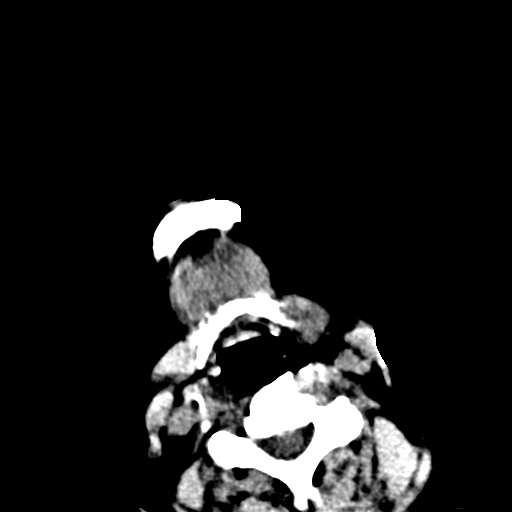
[im 11/96  bone]
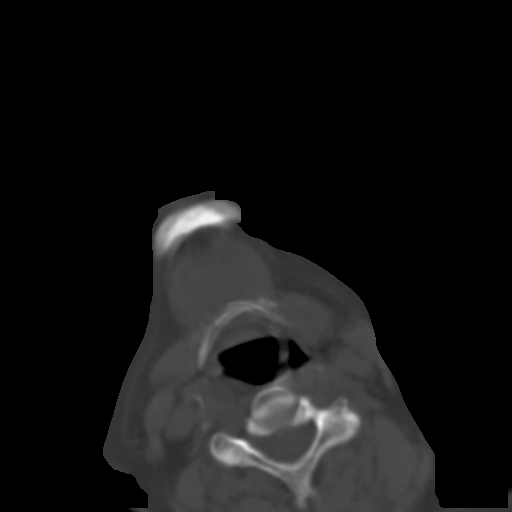
[im 22/96  bone]
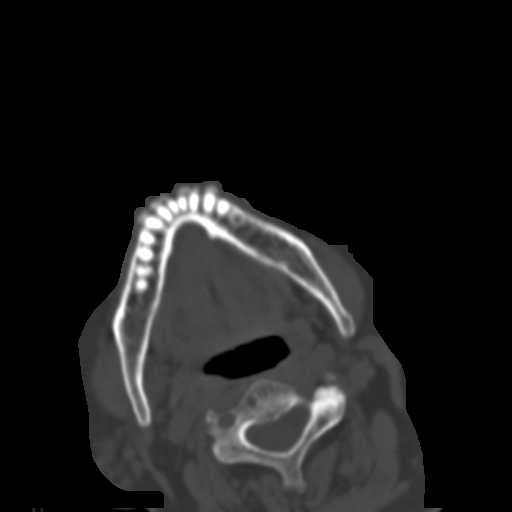
[im 32/96  bone]
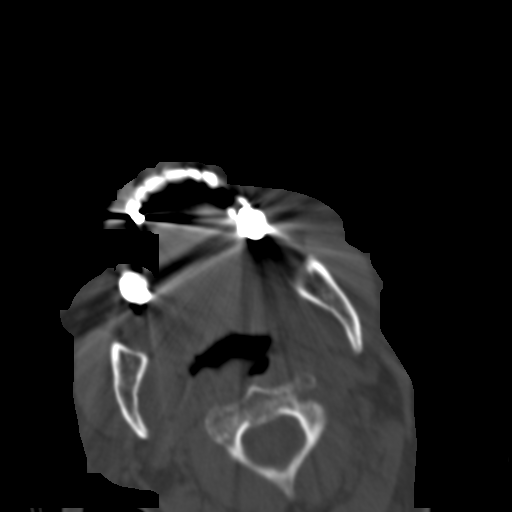
[im 43/96  bone]
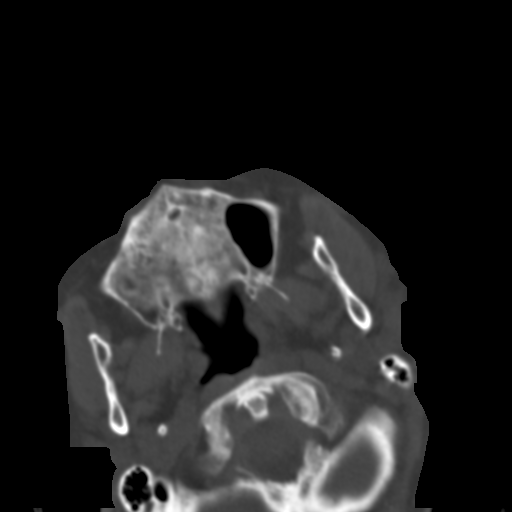
[im 53/96  brain]
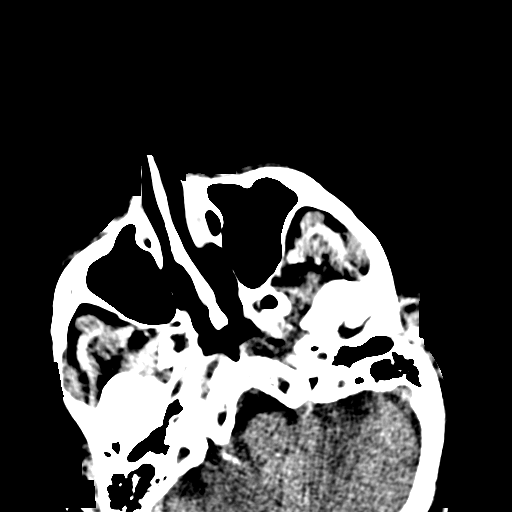
[im 53/96  bone]
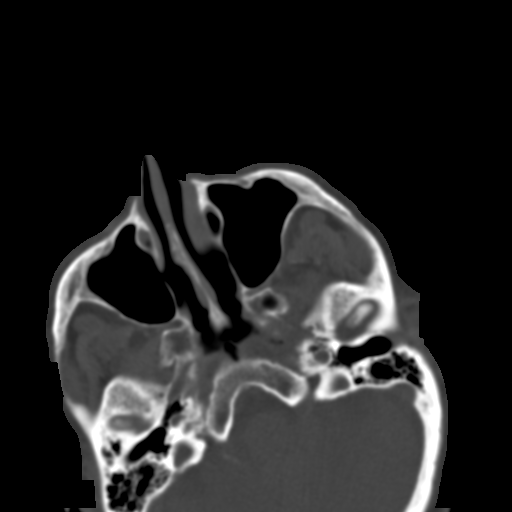
[im 64/96  bone]
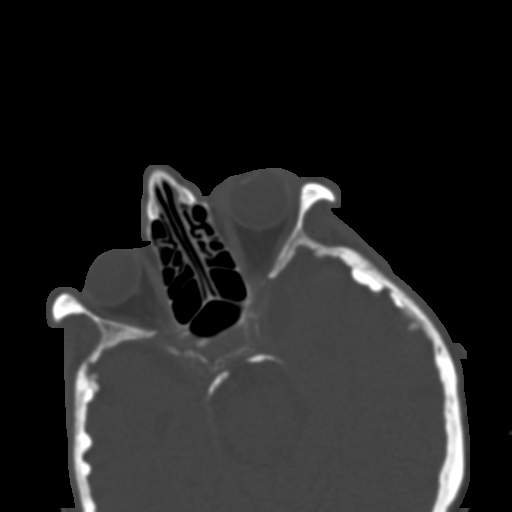
[im 74/96  bone]
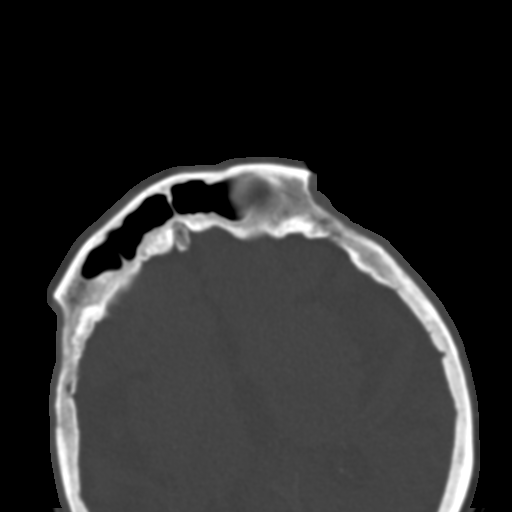
[im 85/96  bone]
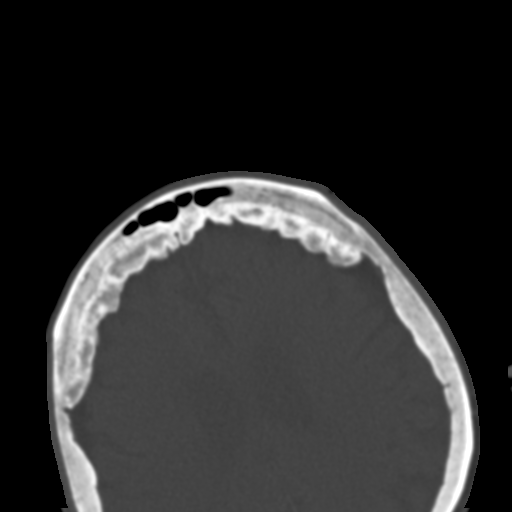

[Series 5: 1.0 thin soft tissue · axial · 0.37mm/px · z∈[-144,-124]mm · 2 of 192 slices shown]
[im 21/192  brain]
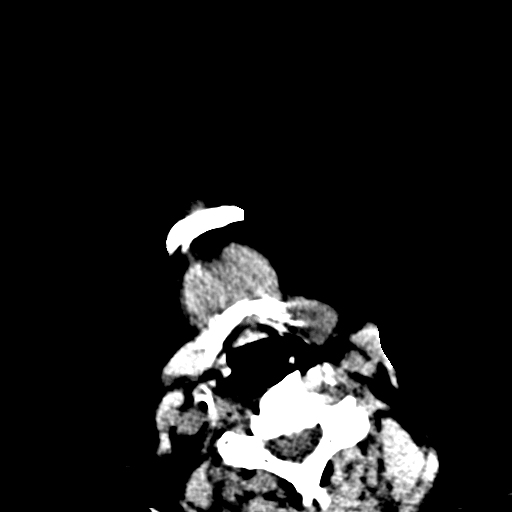
[im 41/192  brain]
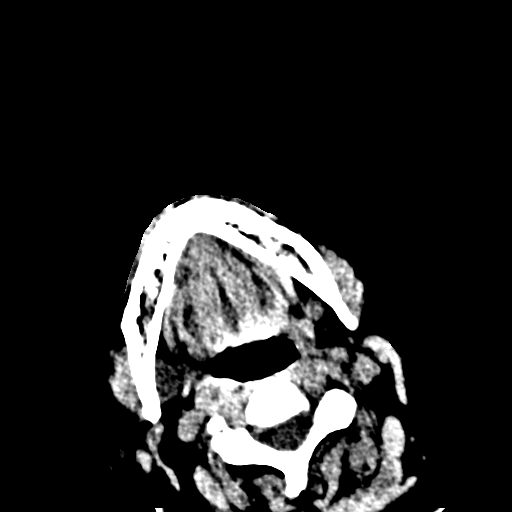

[Series 7: coronal soft tissue · coronal · 0.37mm/px · 3 of 85 slices shown]
[im 29/85  bone]
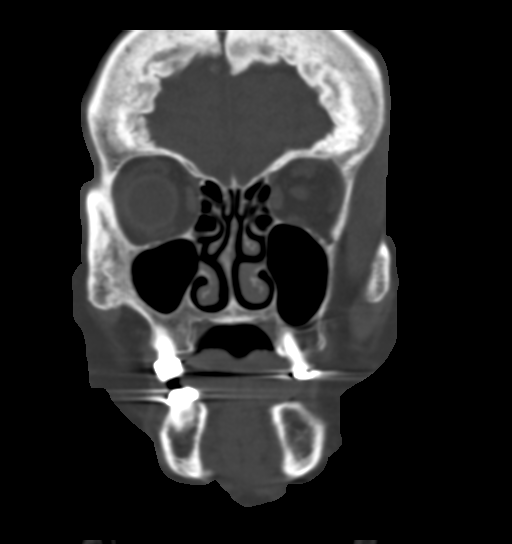
[im 38/85  bone]
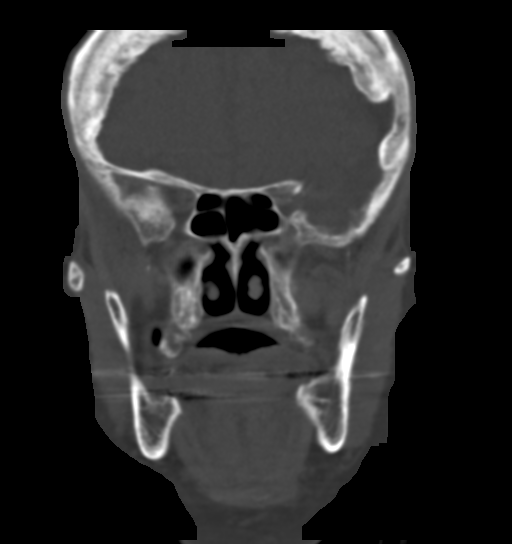
[im 47/85  bone]
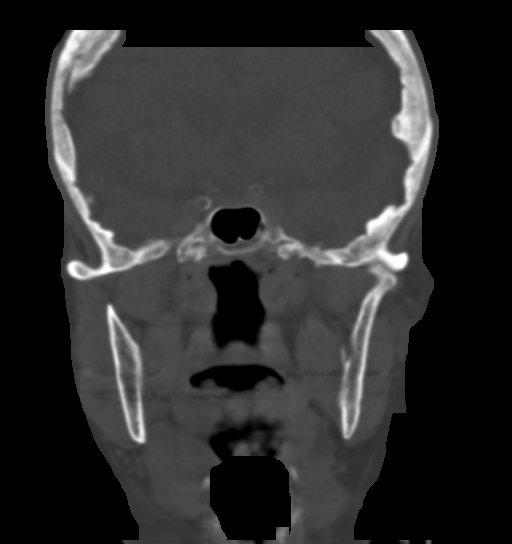

[Series 8: sagittal soft tissue · sagittal · 0.38mm/px · 3 of 85 slices shown]
[im 29/85  bone]
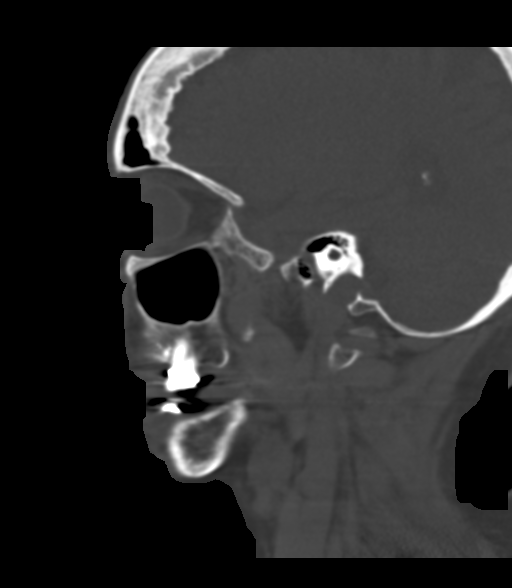
[im 43/85  bone]
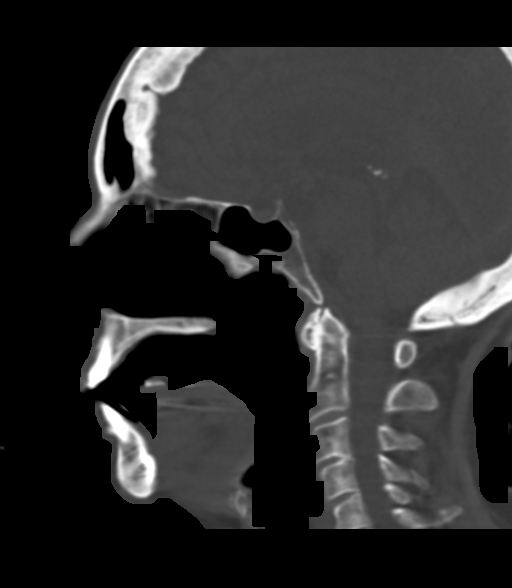
[im 57/85  bone]
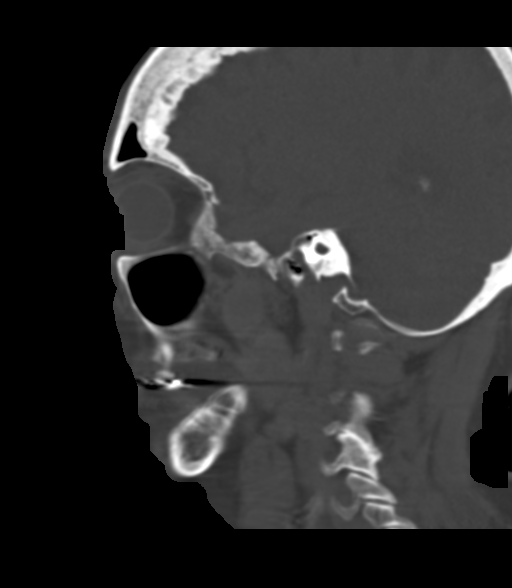

[16 of 47 positions shown; findings below may reference images not displayed]

FINDINGS: CT HEAD FINDINGS

Brain: No evidence of acute infarction, hemorrhage, hydrocephalus,
extra-axial collection or mass lesion/mass effect. Periventricular
and deep white matter hypodensity.

Vascular: No hyperdense vessel or unexpected calcification.

CT FACIAL BONES FINDINGS

Skull: Hyperostosis frontalis. Negative for fracture or focal
lesion.

Facial bones: No displaced fractures or dislocations.

Sinuses/Orbits: Soft tissue contusion overlying the left orbit
(series 4, image 66). The orbital contents are grossly intact by CT.

Other: Soft tissue laceration of the left forehead (series 3, image
35).

CT CERVICAL SPINE FINDINGS

Alignment: Normal.

Skull base and vertebrae: No acute fracture. No primary bone lesion
or focal pathologic process.

Soft tissues and spinal canal: No prevertebral fluid or swelling. No
visible canal hematoma.

Disc levels:  Intact.

Upper chest: Negative.

Other: None.
IMPRESSION: 1. No acute intracranial pathology. Small-vessel white matter
disease.
2. No displaced fractures or dislocations of the facial bones.
3. Soft tissue contusion overlying the left orbit. The orbital
contents are grossly intact by CT.
4. Soft tissue laceration of the left forehead.
5. No fracture or static subluxation of the cervical spine.

## 2021-07-07 IMAGING — CR DG SHOULDER 2+V*L*
3 series · 3 of 3 positions shown · non-contrast
Comparison: None.

CLINICAL DATA: Trauma, fall

EXAM:
LEFT SHOULDER - 2+ VIEW

[shoulder grashey]
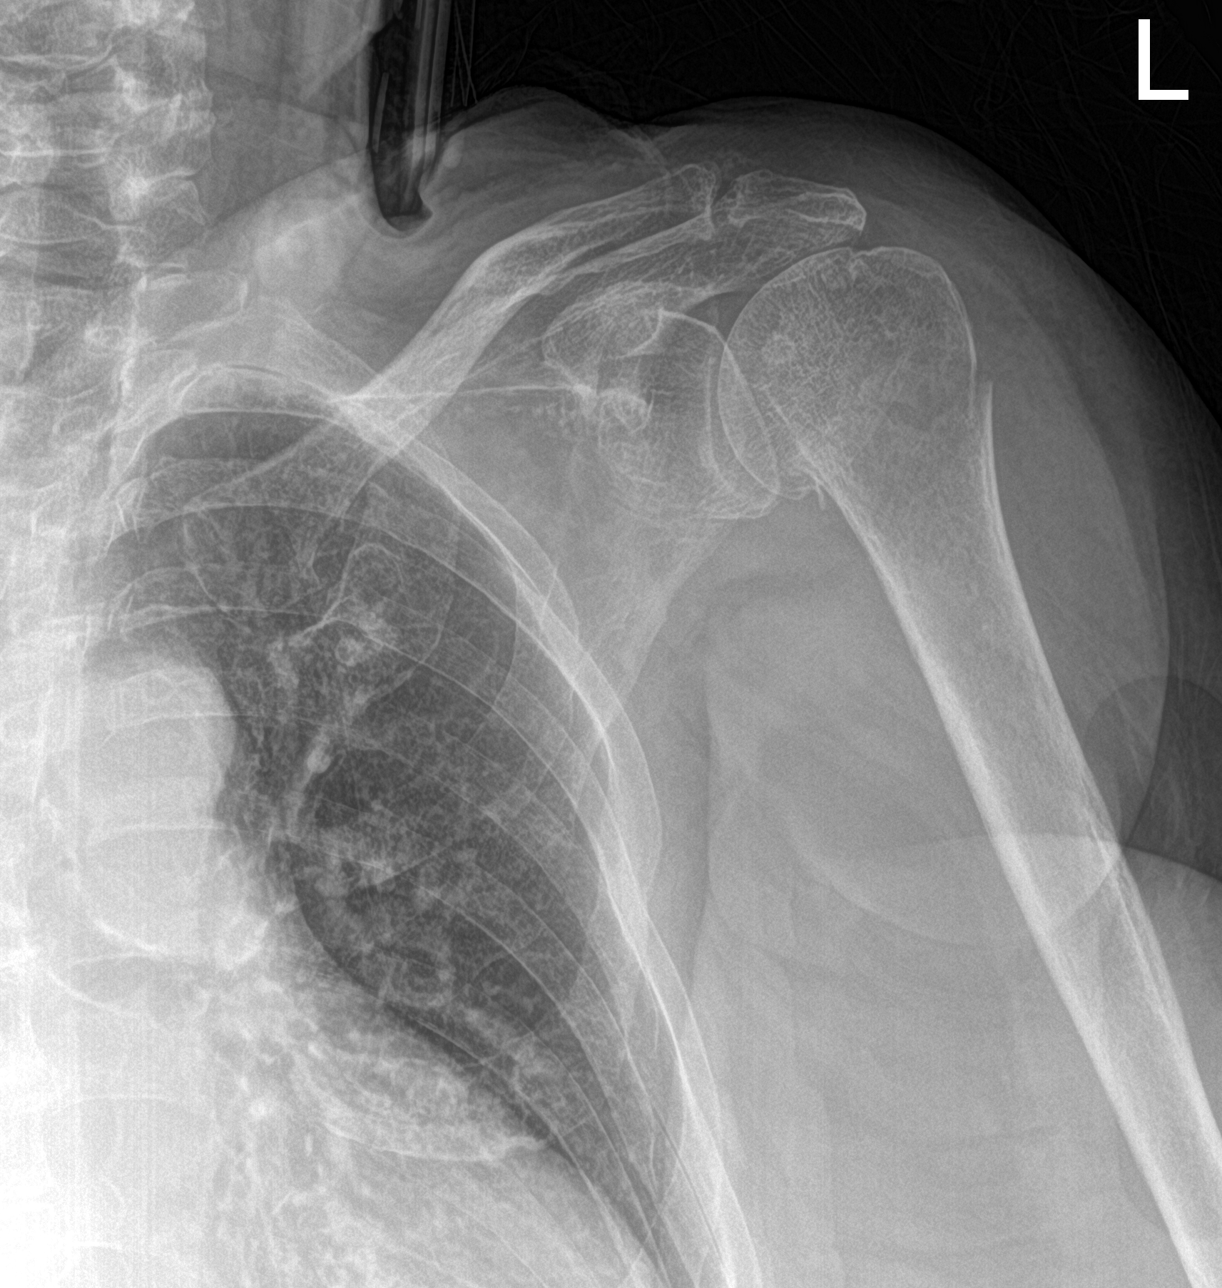

[shoulder y view]
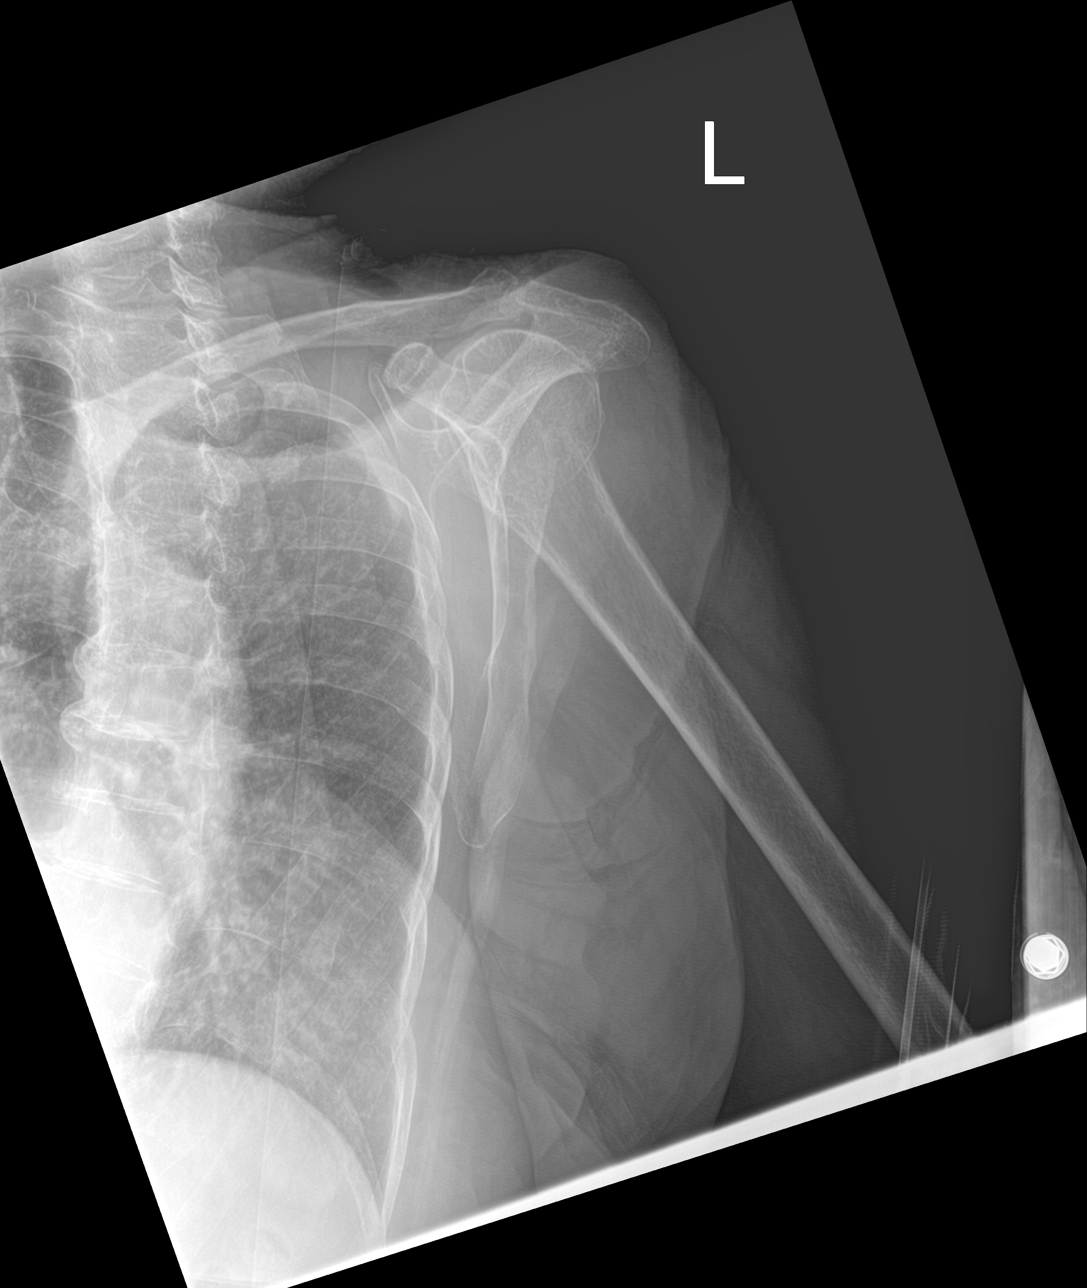

[shoulder ap neutral]
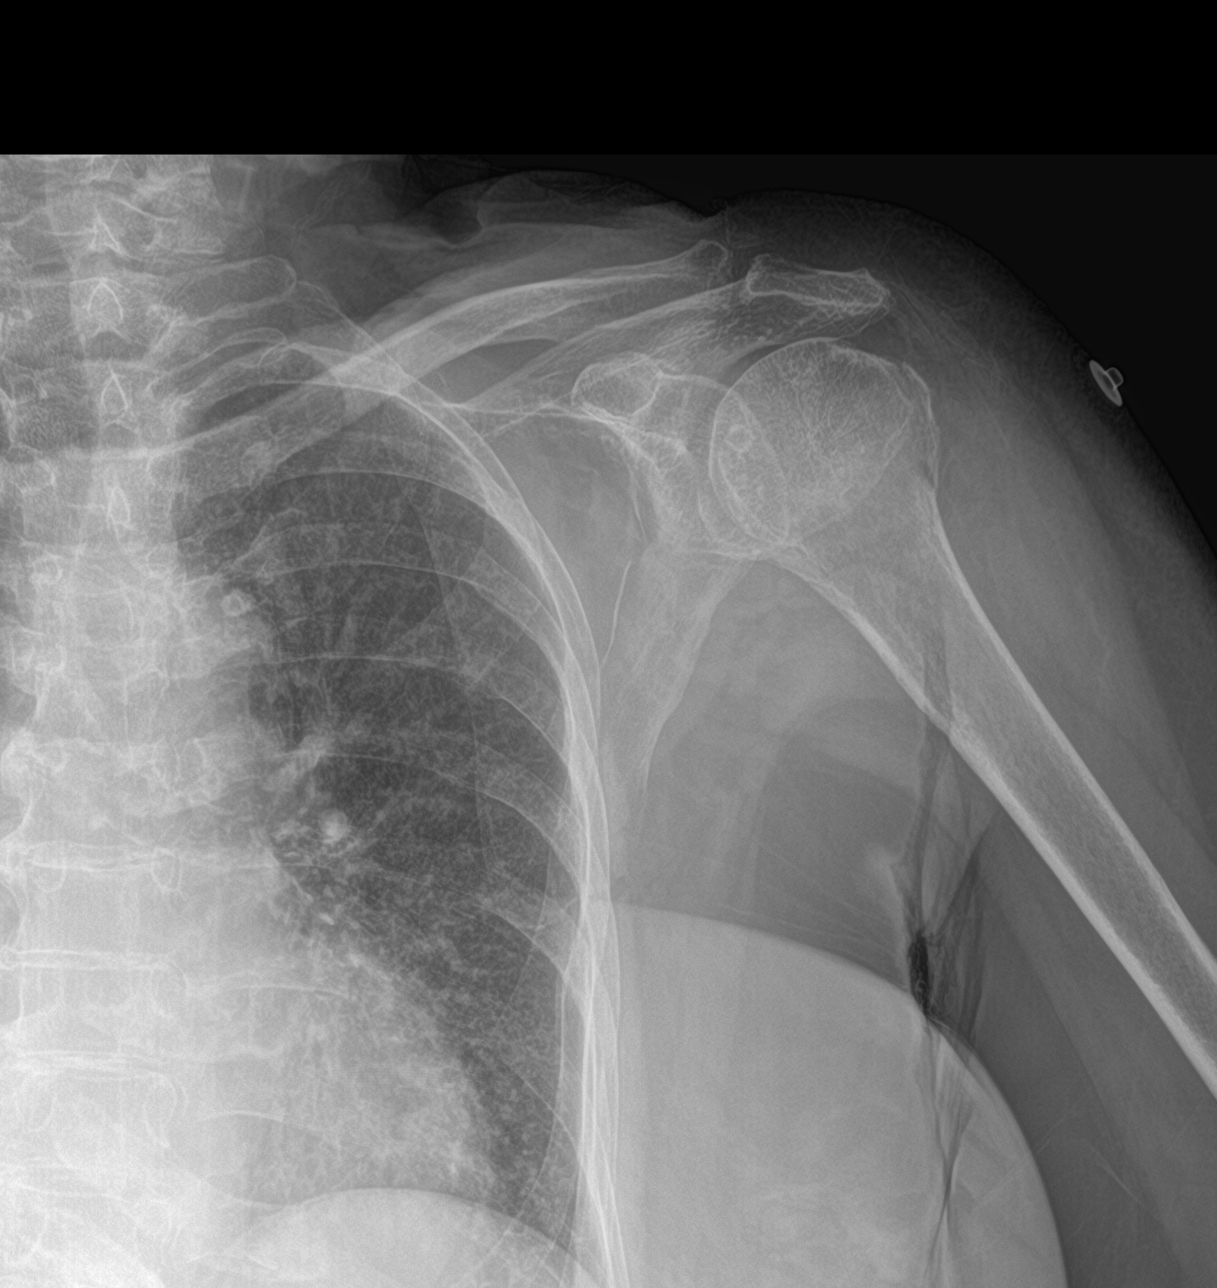

[3 of 3 positions shown; findings below may reference images not displayed]

FINDINGS: There is comminuted fracture in the neck of proximal left humerus.
There is 3 mm offset in the alignment of fracture fragments in the
lateral cortex. Osteopenia is seen in bony structures. Deformity is
seen in the posterolateral aspect of left fifth and sixth ribs.
IMPRESSION: Comminuted fracture is seen in the neck of proximal left humerus.

Deformity seen in the posterolateral aspect of left fifth and sixth
ribs may suggest recent or old undisplaced fracture.

## 2021-07-07 IMAGING — CT CT HEAD W/O CM
3 of 5 series · 14 of 47 positions shown, 16 images · non-contrast
Comparison: [DATE]

CLINICAL DATA: Fall, head trauma, left forehead laceration



[Series 5: head 3.0 mpr cor · coronal · 0.33mm/px · 3 of 65 slices shown]
[im 22/65  brain]
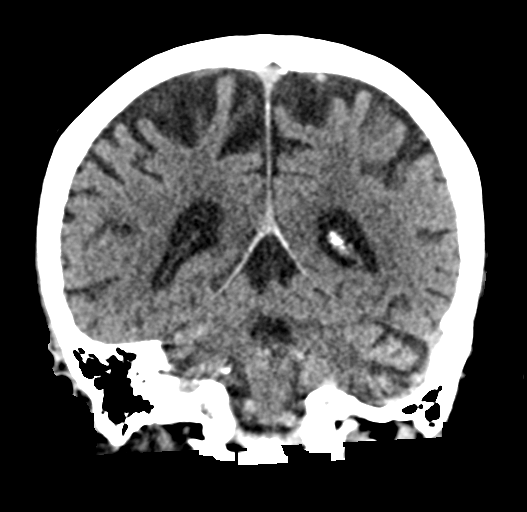
[im 29/65  brain]
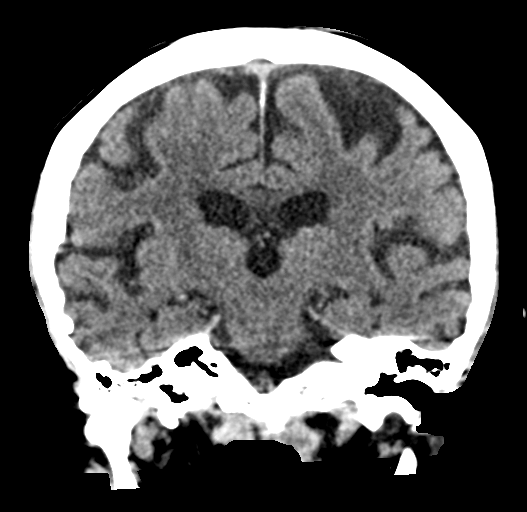
[im 36/65  brain]
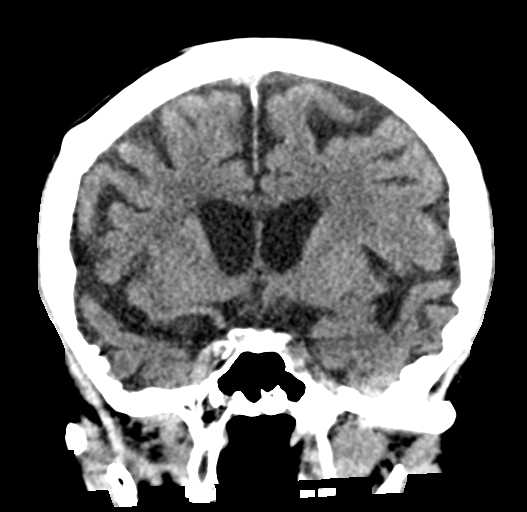

[Series 6: head 3.0 mpr sag · sagittal · 0.32mm/px · 3 of 57 slices shown]
[im 19/57  brain]
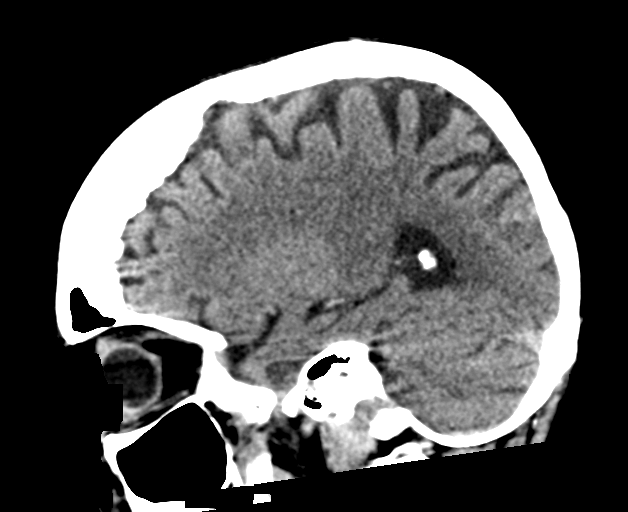
[im 29/57  brain]
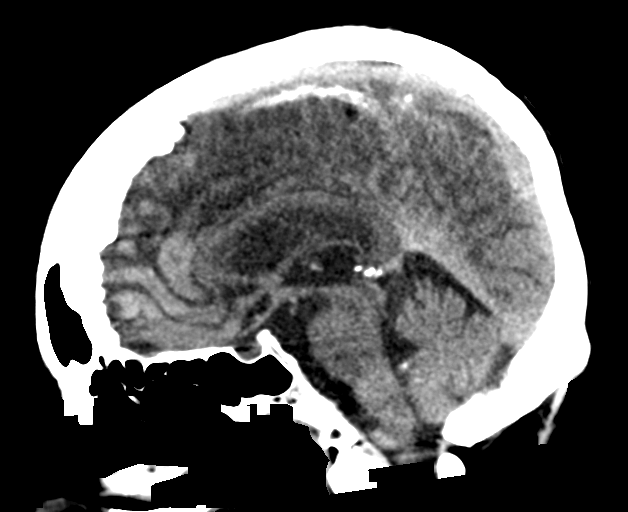
[im 38/57  brain]
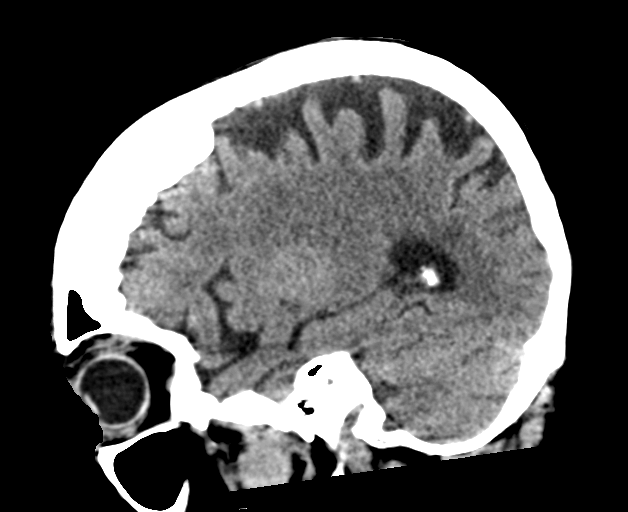

[Series 8: head 2.0 mpr ax · axial · 0.36mm/px · z∈[-48,+83]mm · 8 of 79 slices shown, 10 images]
[im 5/79  brain]
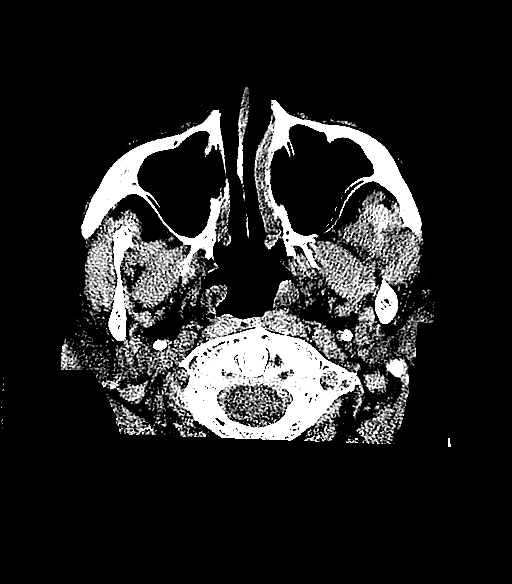
[im 5/79  bone]
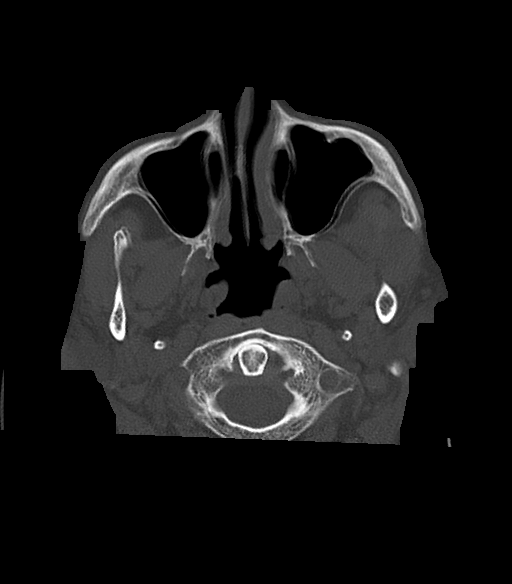
[im 15/79  brain]
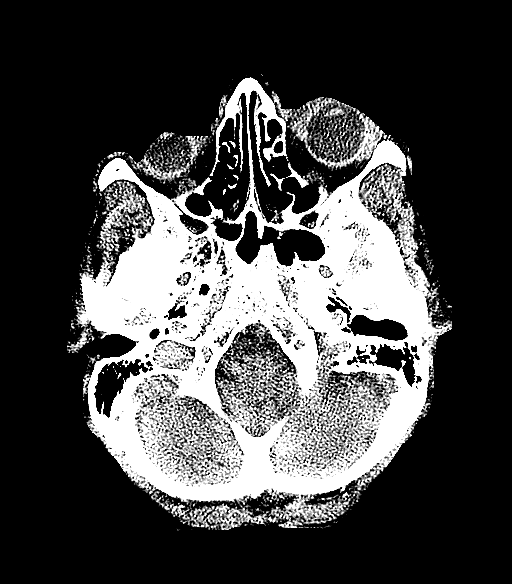
[im 25/79  brain]
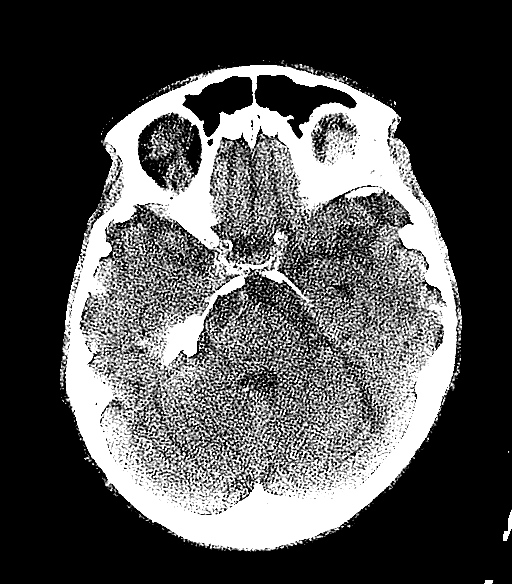
[im 35/79  brain]
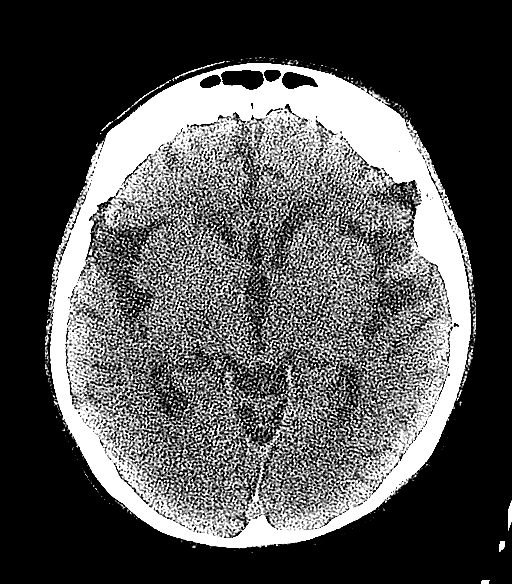
[im 44/79  brain]
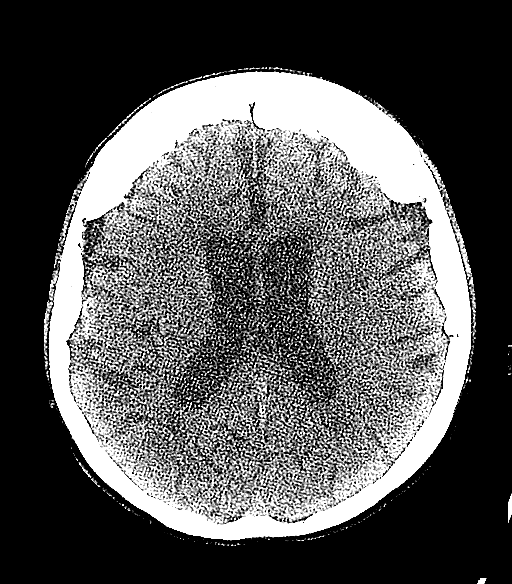
[im 44/79  bone]
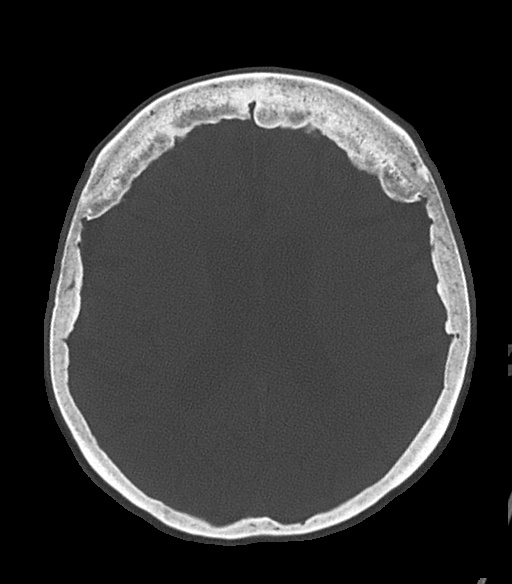
[im 54/79  brain]
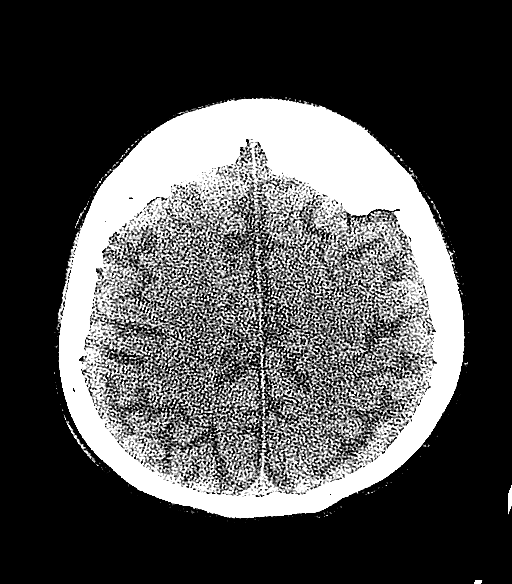
[im 64/79  brain]
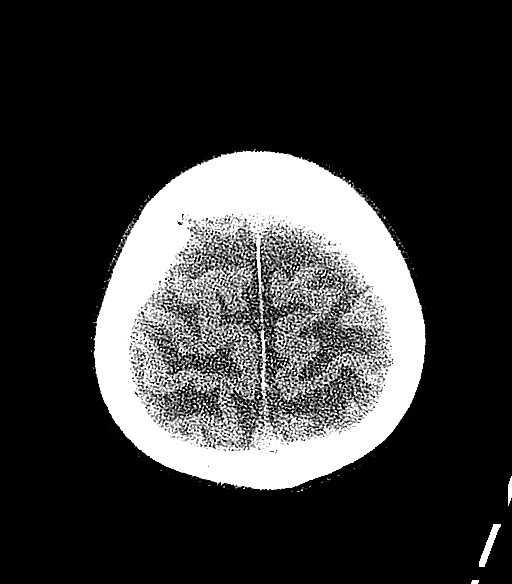
[im 74/79  brain]
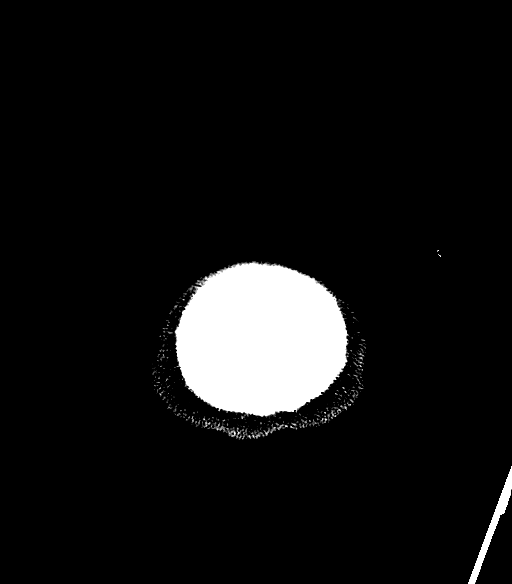

[14 of 47 positions shown; findings below may reference images not displayed]

FINDINGS: CT HEAD FINDINGS

Brain: No evidence of acute infarction, hemorrhage, hydrocephalus,
extra-axial collection or mass lesion/mass effect. Periventricular
and deep white matter hypodensity.

Vascular: No hyperdense vessel or unexpected calcification.

CT FACIAL BONES FINDINGS

Skull: Hyperostosis frontalis. Negative for fracture or focal
lesion.

Facial bones: No displaced fractures or dislocations.

Sinuses/Orbits: Soft tissue contusion overlying the left orbit
(series 4, image 66). The orbital contents are grossly intact by CT.

Other: Soft tissue laceration of the left forehead (series 3, image
35).

CT CERVICAL SPINE FINDINGS

Alignment: Normal.

Skull base and vertebrae: No acute fracture. No primary bone lesion
or focal pathologic process.

Soft tissues and spinal canal: No prevertebral fluid or swelling. No
visible canal hematoma.

Disc levels:  Intact.

Upper chest: Negative.

Other: None.
IMPRESSION: 1. No acute intracranial pathology. Small-vessel white matter
disease.
2. No displaced fractures or dislocations of the facial bones.
3. Soft tissue contusion overlying the left orbit. The orbital
contents are grossly intact by CT.
4. Soft tissue laceration of the left forehead.
5. No fracture or static subluxation of the cervical spine.

## 2021-07-07 IMAGING — CR DG HAND COMPLETE 3+V*R*
3 series · 3 of 3 positions shown · non-contrast
Comparison: None.

CLINICAL DATA: Pain right hand

EXAM:
RIGHT HAND - COMPLETE 3+ VIEW

[hand pa]
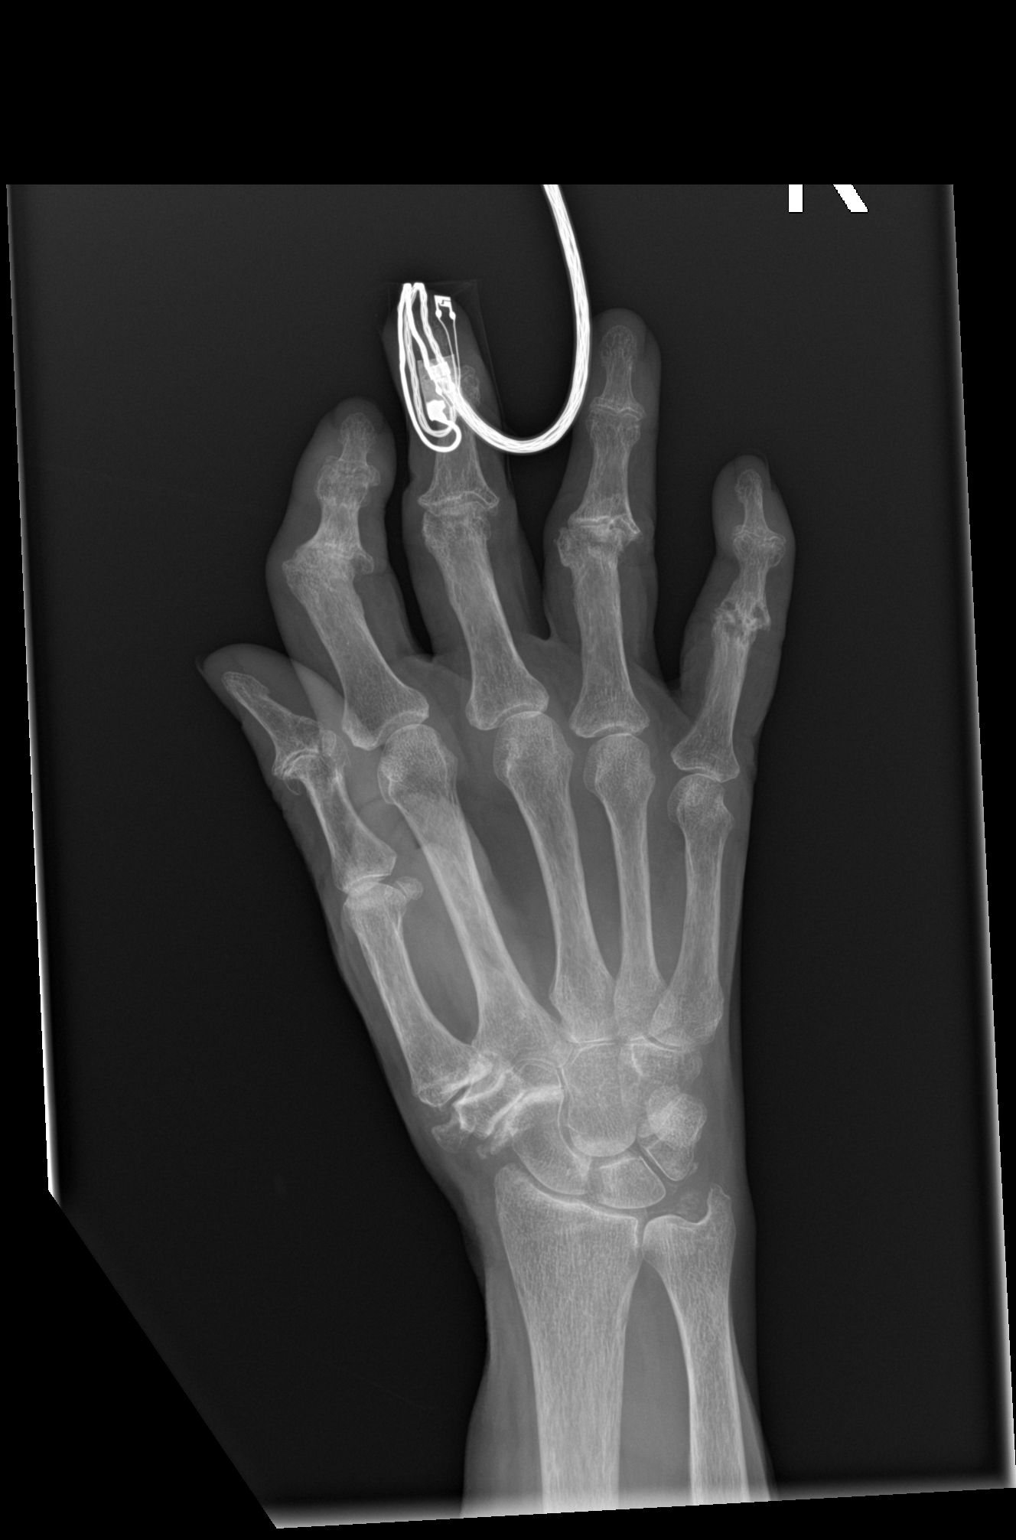

[hand obl]
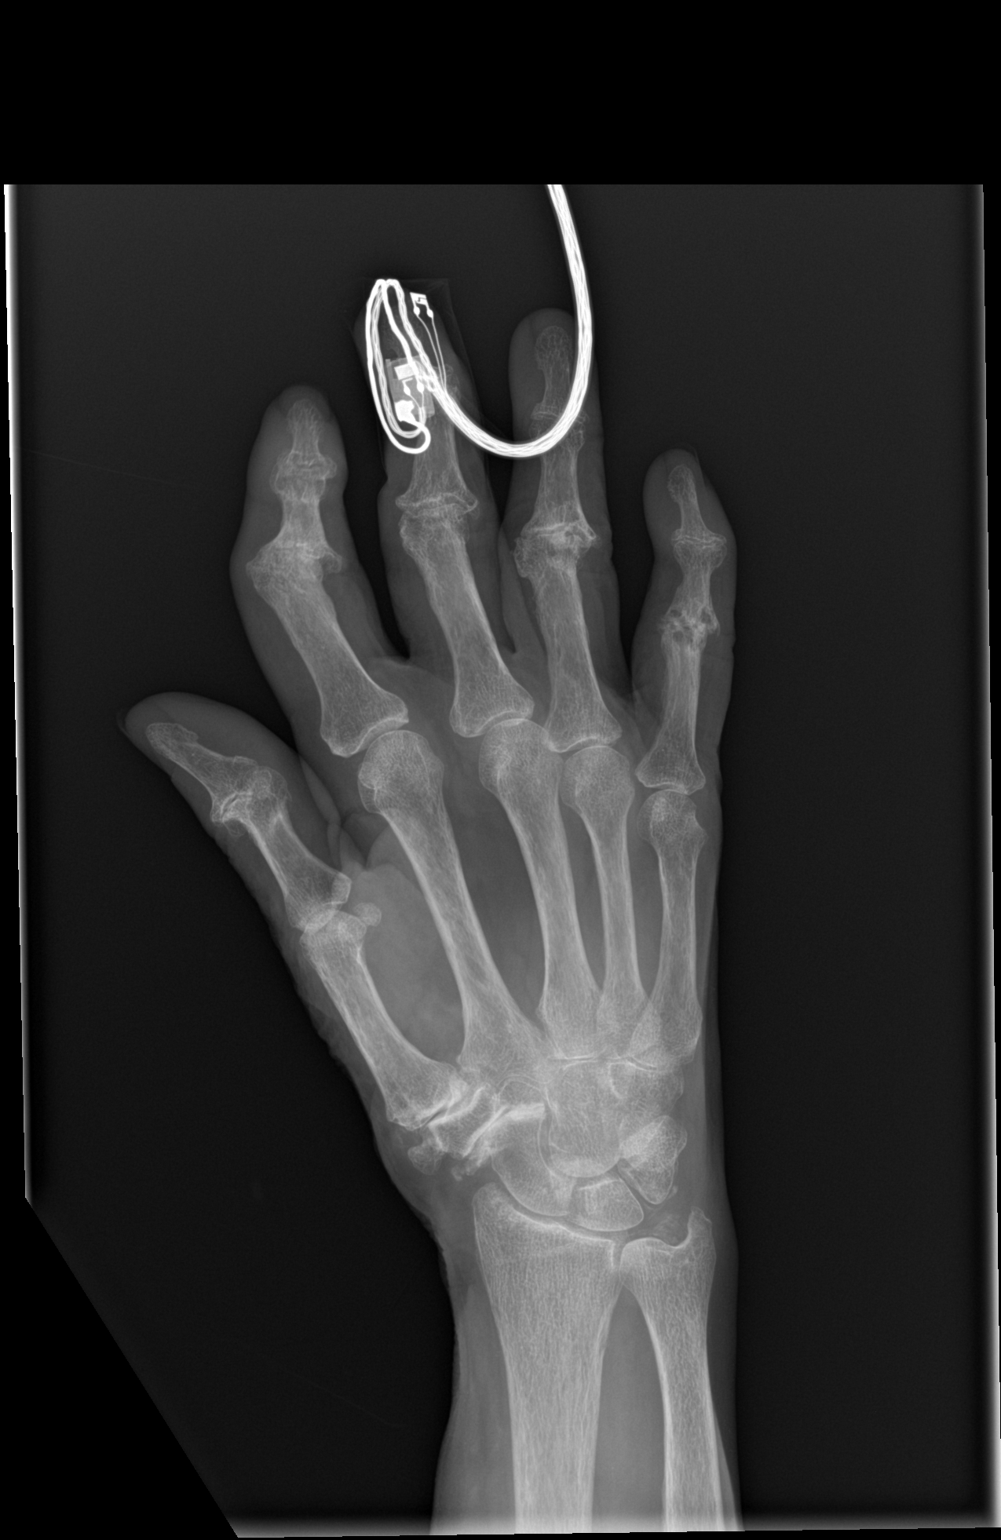

[hand lat]
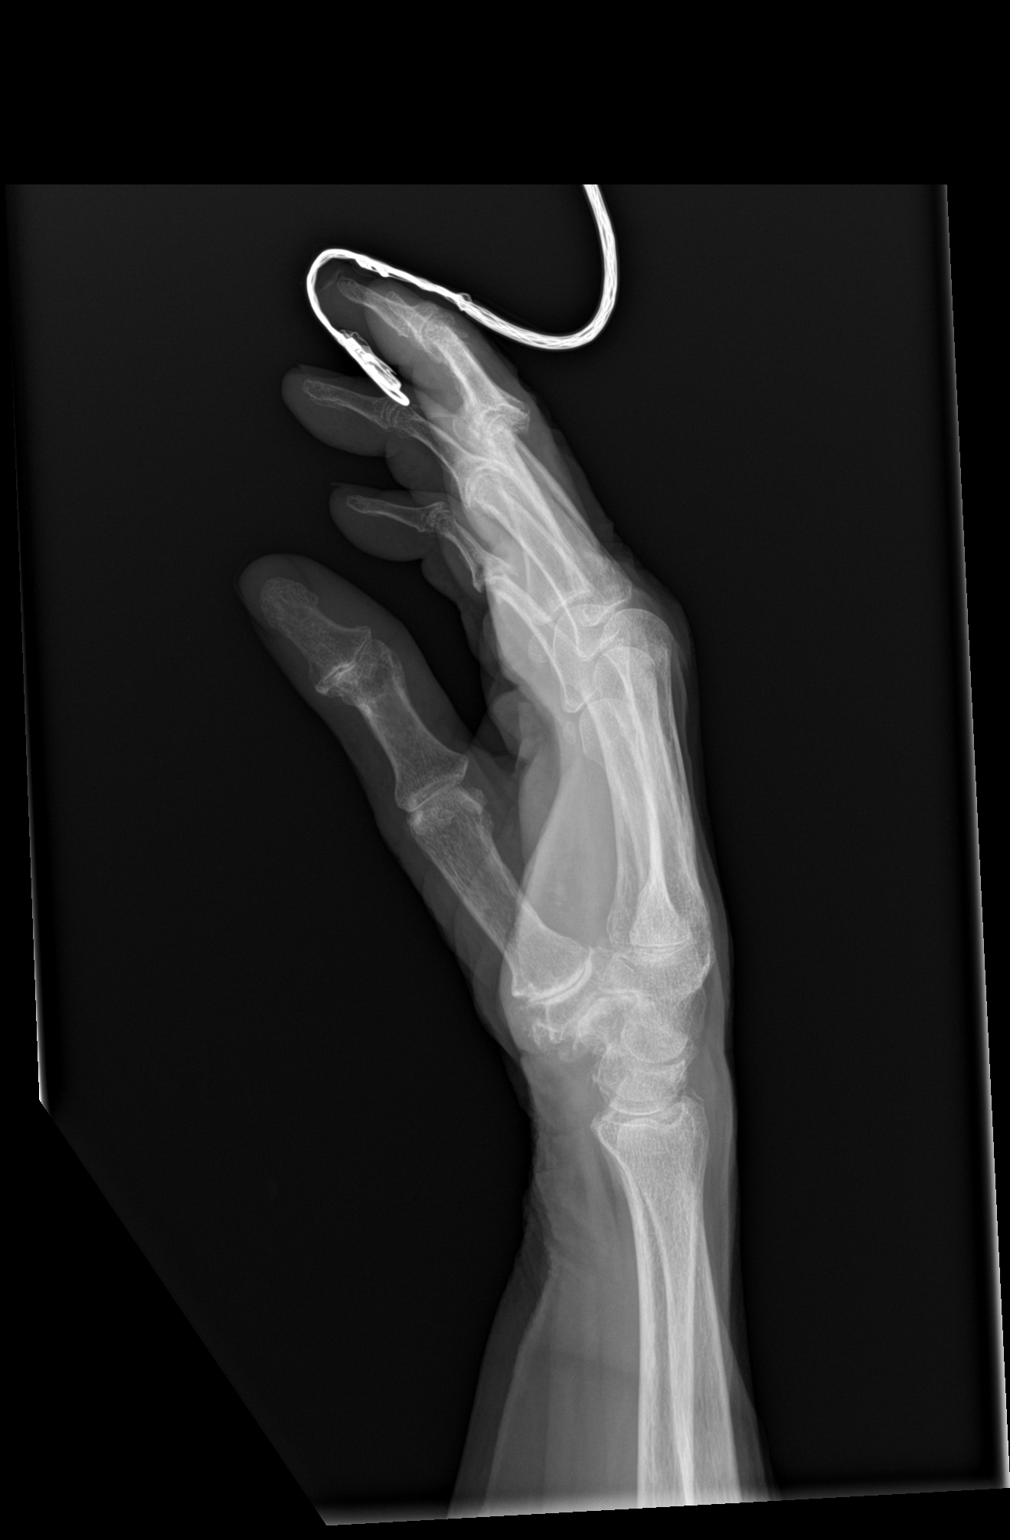

[3 of 3 positions shown; findings below may reference images not displayed]

FINDINGS: No recent fracture or dislocation is seen. There is monitoring
device partly obscuring the middle and distal phalanges of the
middle finger. Severe degenerative changes are noted in the
interphalangeal joints, more so in the PIP joints. Significant
degenerative changes are also noted in the intercarpal joints along
the lateral aspect of the wrist and in the first carpometacarpal
joint.
IMPRESSION: No recent fracture or dislocation is seen. Degenerative changes are
noted in multiple joints as described in the body of the report,
more so in the PIP joints

## 2021-07-07 IMAGING — CR DG LUMBAR SPINE COMPLETE 4+V
5 series · 5 of 5 positions shown · non-contrast
Comparison: CT abdomen pelvis dated [DATE]

CLINICAL DATA: Fall with pain.

EXAM:
LUMBAR SPINE - COMPLETE 4+ VIEW

[l-spine ap]
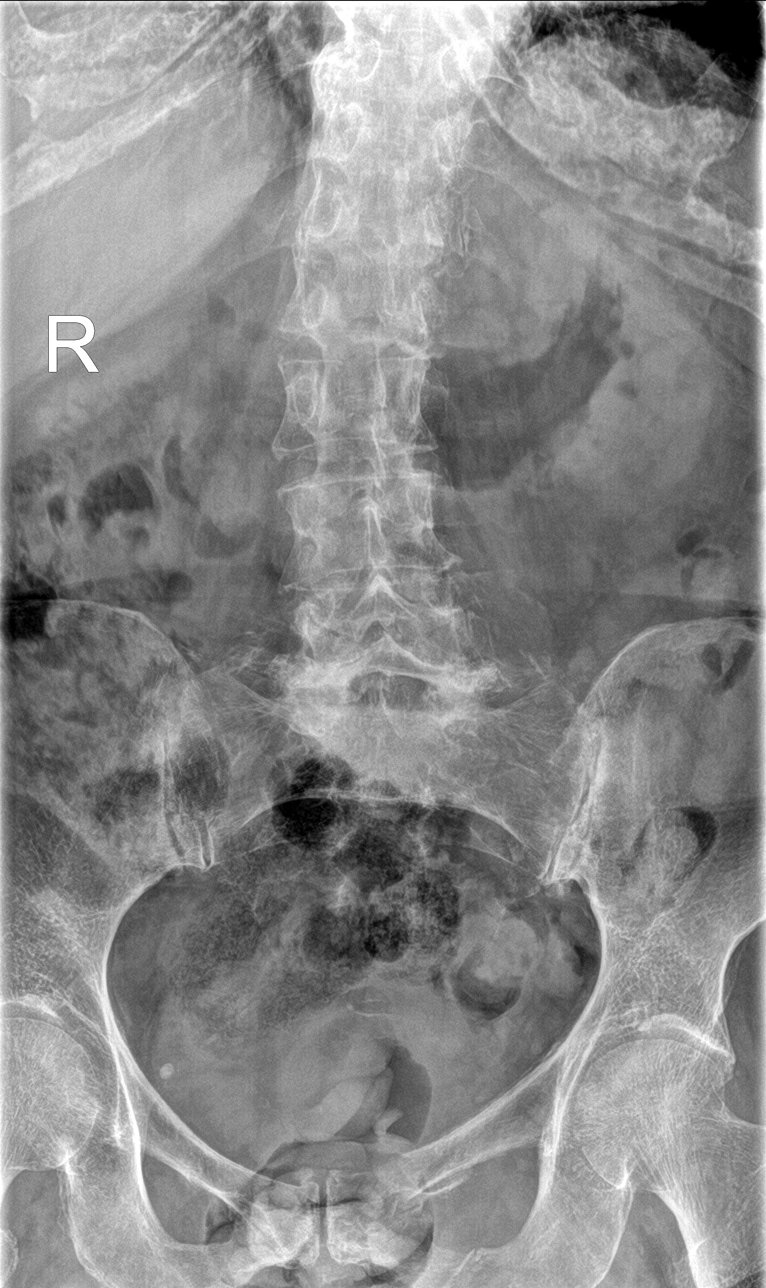

[l-spine obl (1 of 2)]
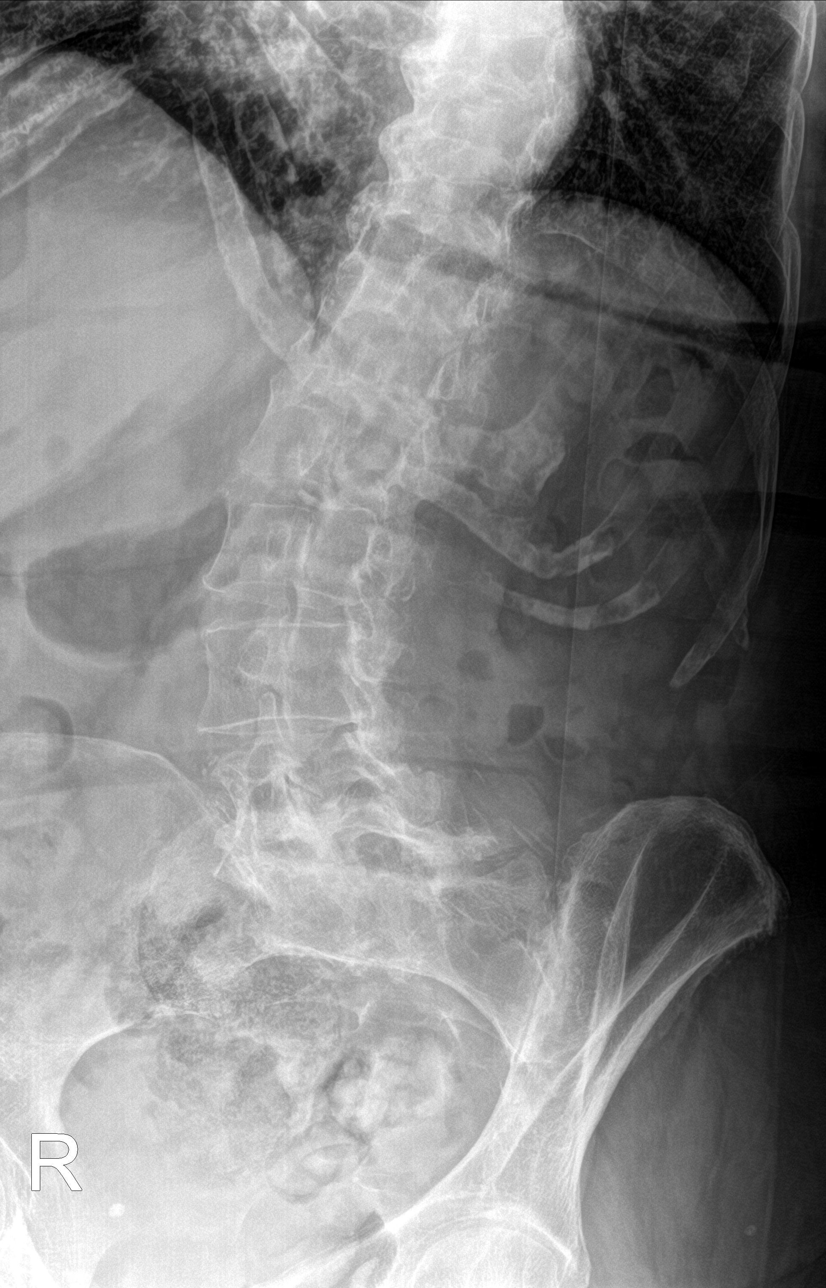

[l-spine obl (2 of 2)]
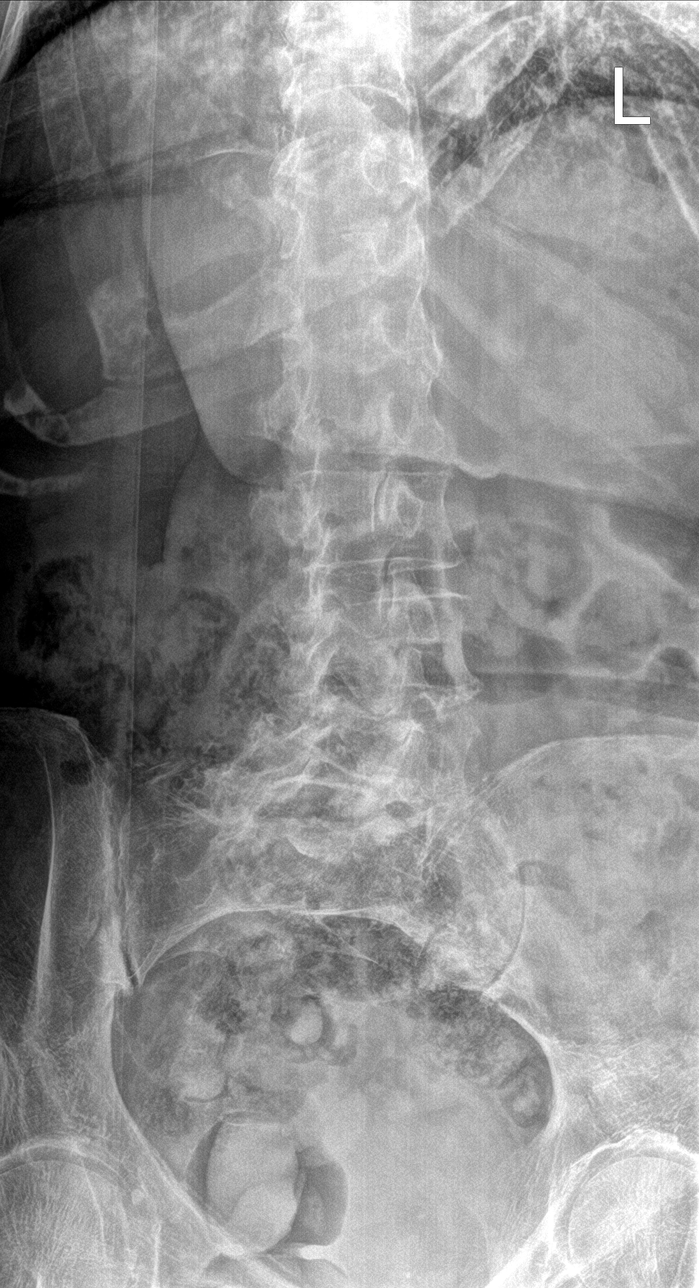

[l-spine spot]
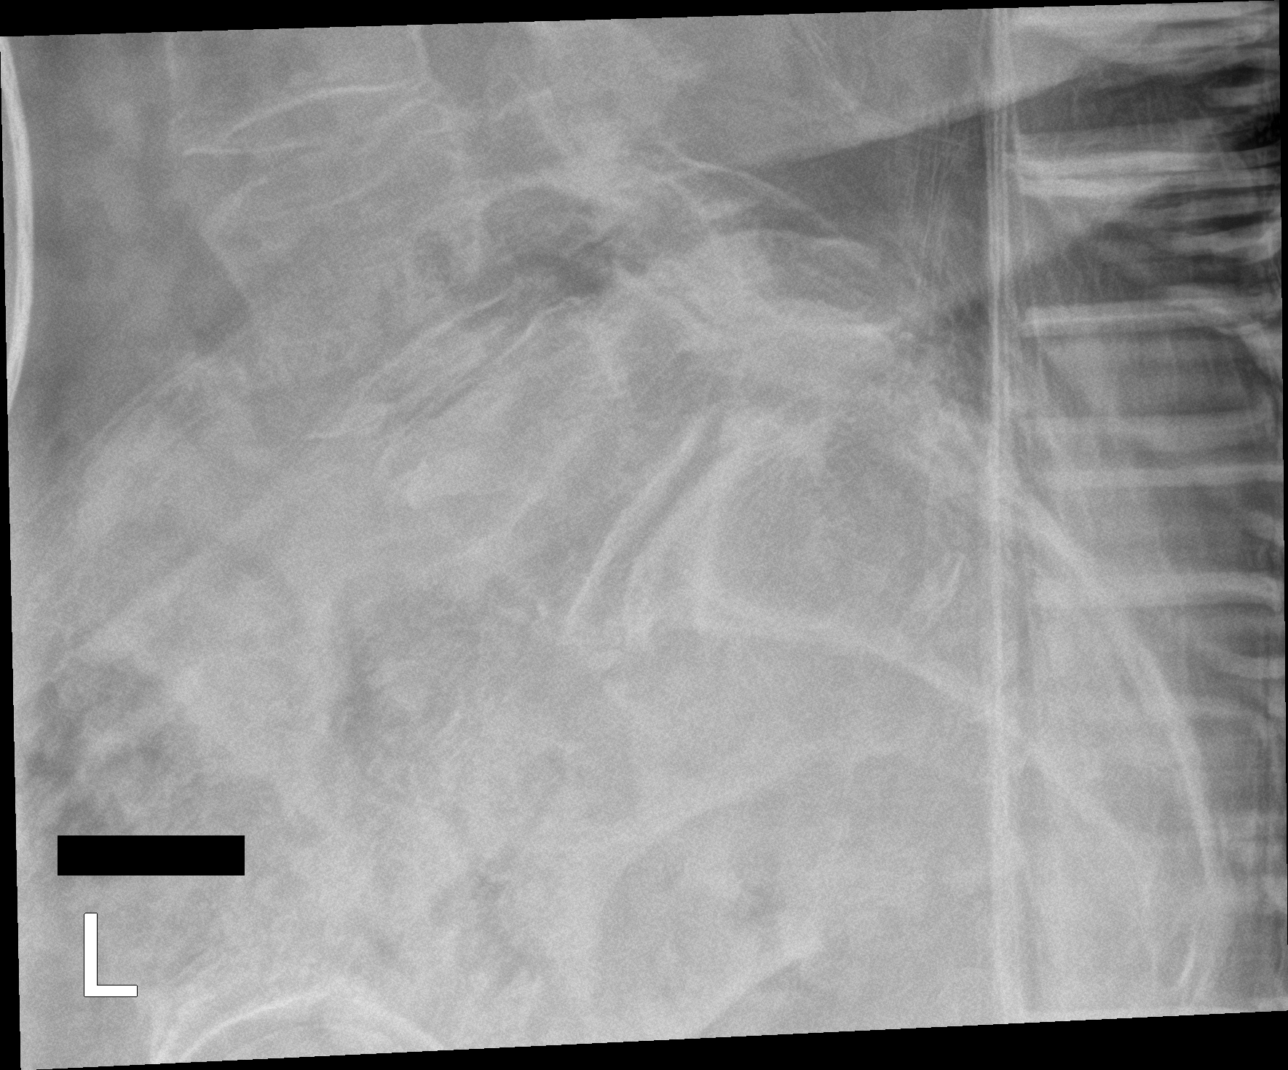

[l-spine lat]
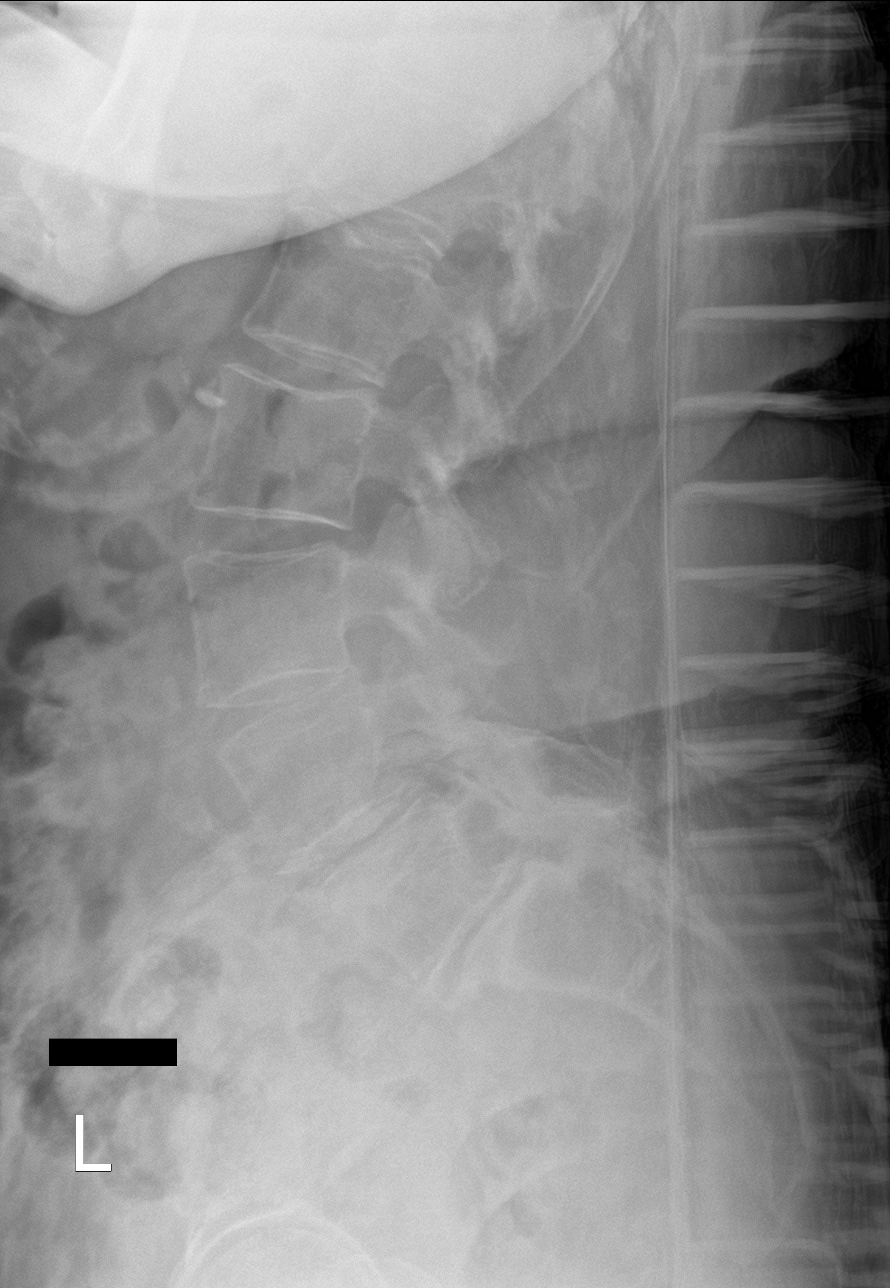

[5 of 5 positions shown; findings below may reference images not displayed]

FINDINGS: There is no evidence of lumbar spine fracture. There is 4 mm
anterolisthesis of L4 on L5 and 11 mm anterolisthesis of L5 on S1.
Anterolisthesis of L5 on S1 appears increased since [DATE] given
differences in technique. Moderate to severe degenerative disc and
joint disease is seen in the lower lumbar spine. Stool overlies the
rectum.
IMPRESSION: No acute osseous injury. Chronic degenerative changes and chronic
appearing anterolisthesis at L4-5 and L5-S1.

## 2021-07-07 MED ORDER — SODIUM CHLORIDE 0.9 % IV BOLUS (SEPSIS)
500.0000 mL | Freq: Once | INTRAVENOUS | Status: AC
  Administered 2021-07-07: 500 mL via INTRAVENOUS

## 2021-07-07 MED ORDER — LISINOPRIL 10 MG PO TABS
10.0000 mg | ORAL_TABLET | Freq: Every day | ORAL | Status: DC
  Administered 2021-07-07 – 2021-07-09 (×3): 10 mg via ORAL
  Filled 2021-07-07 (×3): qty 1

## 2021-07-07 MED ORDER — ENOXAPARIN SODIUM 40 MG/0.4ML IJ SOSY
40.0000 mg | PREFILLED_SYRINGE | INTRAMUSCULAR | Status: DC
  Administered 2021-07-07 – 2021-07-08 (×2): 40 mg via SUBCUTANEOUS
  Filled 2021-07-07 (×2): qty 0.4

## 2021-07-07 MED ORDER — PANTOPRAZOLE SODIUM 40 MG PO TBEC
40.0000 mg | DELAYED_RELEASE_TABLET | Freq: Every day | ORAL | Status: DC
  Administered 2021-07-08 – 2021-07-09 (×2): 40 mg via ORAL
  Filled 2021-07-07 (×2): qty 1

## 2021-07-07 MED ORDER — TETANUS-DIPHTH-ACELL PERTUSSIS 5-2.5-18.5 LF-MCG/0.5 IM SUSY
0.5000 mL | PREFILLED_SYRINGE | Freq: Once | INTRAMUSCULAR | Status: AC
  Administered 2021-07-07: 0.5 mL via INTRAMUSCULAR
  Filled 2021-07-07: qty 0.5

## 2021-07-07 MED ORDER — INSULIN ASPART 100 UNIT/ML IJ SOLN: 0.0000 [IU] | INTRAMUSCULAR | Status: DC

## 2021-07-07 MED ORDER — ADULT MULTIVITAMIN W/MINERALS CH
1.0000 | ORAL_TABLET | Freq: Every day | ORAL | Status: DC
  Administered 2021-07-07 – 2021-07-09 (×3): 1 via ORAL
  Filled 2021-07-07 (×3): qty 1

## 2021-07-07 MED ORDER — INSULIN REGULAR(HUMAN) IN NACL 100-0.9 UT/100ML-% IV SOLN
INTRAVENOUS | Status: DC
  Administered 2021-07-07: 11.5 [IU]/h via INTRAVENOUS
  Administered 2021-07-08: 3.2 [IU]/h via INTRAVENOUS
  Filled 2021-07-07 (×2): qty 100

## 2021-07-07 MED ORDER — ALPRAZOLAM 0.5 MG PO TABS
0.2500 mg | ORAL_TABLET | Freq: Every evening | ORAL | Status: DC | PRN
  Administered 2021-07-07 – 2021-07-09 (×2): 0.25 mg via ORAL
  Filled 2021-07-07 (×2): qty 1

## 2021-07-07 MED ORDER — LIDOCAINE-EPINEPHRINE-TETRACAINE (LET) TOPICAL GEL
3.0000 mL | Freq: Once | TOPICAL | Status: DC
  Filled 2021-07-07: qty 3

## 2021-07-07 MED ORDER — INSULIN ASPART 100 UNIT/ML IJ SOLN: 10.0000 [IU] | Freq: Once | INTRAMUSCULAR | Status: DC

## 2021-07-07 MED ORDER — POTASSIUM CHLORIDE 10 MEQ/100ML IV SOLN
10.0000 meq | INTRAVENOUS | Status: AC
  Administered 2021-07-07: 10 meq via INTRAVENOUS
  Filled 2021-07-07: qty 100

## 2021-07-07 MED ORDER — LIDOCAINE-EPINEPHRINE-TETRACAINE (LET) TOPICAL GEL
3.0000 mL | Freq: Once | TOPICAL | Status: AC
  Administered 2021-07-07: 3 mL via TOPICAL
  Filled 2021-07-07: qty 3

## 2021-07-07 MED ORDER — QUETIAPINE FUMARATE 25 MG PO TABS
25.0000 mg | ORAL_TABLET | Freq: Every day | ORAL | Status: DC
  Administered 2021-07-07 – 2021-07-08 (×2): 25 mg via ORAL
  Filled 2021-07-07 (×3): qty 1

## 2021-07-07 MED ORDER — SENNOSIDES-DOCUSATE SODIUM 8.6-50 MG PO TABS: 1.0000 | ORAL_TABLET | Freq: Every evening | ORAL | Status: DC | PRN

## 2021-07-07 MED ORDER — DEXTROSE IN LACTATED RINGERS 5 % IV SOLN: INTRAVENOUS | Status: DC

## 2021-07-07 MED ORDER — LACTATED RINGERS IV SOLN: INTRAVENOUS | Status: DC

## 2021-07-07 MED ORDER — ACETAMINOPHEN 325 MG PO TABS
650.0000 mg | ORAL_TABLET | Freq: Four times a day (QID) | ORAL | Status: DC | PRN
  Administered 2021-07-07: 650 mg via ORAL
  Filled 2021-07-07: qty 2

## 2021-07-07 MED ORDER — DEXTROSE 50 % IV SOLN: 0.0000 mL | INTRAVENOUS | Status: DC | PRN

## 2021-07-07 MED ORDER — SODIUM CHLORIDE 0.9 % IV BOLUS
500.0000 mL | Freq: Once | INTRAVENOUS | Status: AC
  Administered 2021-07-07: 500 mL via INTRAVENOUS

## 2021-07-07 MED ORDER — ASCORBIC ACID 500 MG PO TABS
1000.0000 mg | ORAL_TABLET | Freq: Every day | ORAL | Status: DC
  Administered 2021-07-07 – 2021-07-09 (×3): 1000 mg via ORAL
  Filled 2021-07-07 (×3): qty 2

## 2021-07-07 MED ORDER — MORPHINE SULFATE (PF) 4 MG/ML IV SOLN
4.0000 mg | Freq: Once | INTRAVENOUS | Status: AC
  Administered 2021-07-07: 4 mg via INTRAVENOUS
  Filled 2021-07-07: qty 1

## 2021-07-07 MED ORDER — LIDOCAINE HCL (PF) 1 % IJ SOLN
10.0000 mL | Freq: Once | INTRAMUSCULAR | Status: AC
  Administered 2021-07-07: 10 mL
  Filled 2021-07-07: qty 10

## 2021-07-07 MED ORDER — SODIUM CHLORIDE 0.9 % IV SOLN
1000.0000 mL | INTRAVENOUS | Status: DC
  Administered 2021-07-07: 1000 mL via INTRAVENOUS

## 2021-07-07 MED ORDER — VITAMIN D 25 MCG (1000 UNIT) PO TABS
1000.0000 [IU] | ORAL_TABLET | Freq: Every day | ORAL | Status: DC
  Administered 2021-07-07 – 2021-07-09 (×3): 1000 [IU] via ORAL
  Filled 2021-07-07 (×3): qty 1

## 2021-07-07 MED ORDER — ACETAMINOPHEN 650 MG RE SUPP: 650.0000 mg | Freq: Four times a day (QID) | RECTAL | Status: DC | PRN

## 2021-07-07 MED ORDER — ONDANSETRON HCL 4 MG/2ML IJ SOLN
4.0000 mg | Freq: Once | INTRAMUSCULAR | Status: AC
  Administered 2021-07-07: 4 mg via INTRAVENOUS

## 2021-07-07 MED ORDER — POTASSIUM CHLORIDE 10 MEQ/100ML IV SOLN
10.0000 meq | INTRAVENOUS | Status: AC
  Administered 2021-07-07 – 2021-07-08 (×5): 10 meq via INTRAVENOUS
  Filled 2021-07-07 (×5): qty 100

## 2021-07-07 NOTE — Progress Notes (Signed)
Patient received from ED via stretcher.  Transferred to bed and assisted in position of comfort.  Left forehead laceration with sutures open to air.  Incontinent of urine, full bed changed.  VSS, Needs addressed.  Report given to night RN. ?

## 2021-07-07 NOTE — ED Notes (Signed)
Patient transported to CT 

## 2021-07-07 NOTE — ED Provider Notes (Signed)
LACERATION REPAIR ?Performed by: Micheline Maze ?Authorized by: Micheline Maze ?Consent: Verbal consent obtained. ?Risks and benefits: risks, benefits and alternatives were discussed ?Consent given by: patient ?Patient identity confirmed: provided demographic data ?Prepped and Draped in normal sterile fashion ?Wound explored ? ?Laceration Location: L forehead ? ?Laceration Length: 4 cm vertical laceration and 1.5 cm connecting horizontal laceration   ? ?No Foreign Bodies seen or palpated ? ?Anesthesia: LET gel ? ?Anesthetic total: 3 ml ? ?Irrigation method: syringe ?Amount of cleaning: standard ? ?Skin closure: Fast gut absorbable sutures ? ?Number of sutures: 6 ? ?Technique: Simple interrupted ? ?Patient tolerance: Patient tolerated the procedure well with no immediate complications.  ?  ?Micheline Maze, MD ?07/07/21 1708 ? ?  ?Milagros Loll, MD ?07/07/21 1758 ? ?

## 2021-07-07 NOTE — H&P (Signed)
? ? ? ?Date: 07/07/2021     ?     ?     ?Patient Name:  Victoria Tyler MRN: [id number]  ?DOB: 03-16-1941 Age / Sex: 81 y.o., female   ?PCP: Bartholome Bill, MD    ?     ?Medical Service: Internal Medicine Teaching Service    ?     ?Attending Physician: Dr. Heber Renovo, Rachel Moulds, DO    ?First Contact: Dr. Raymondo Band Pager: [phone]  ?Second Contact: Dr. Coy Saunas Pager: [phone]  ?     ?After Hours (After 5p/  First Contact Pager: [phone]  ?weekends / holidays): Second Contact Pager: [phone]  ? ?Chief Complaint: unwitnessed fall ? ?History of Present Illness: Victoria Tyler is an 81 yo female with dementia, hypertension, and uncontrolled type 2 diabetes who presents to [facility] after an unwitnessed fall. ? ?History is unable to be obtained from the patient given her advanced dementia, however, she is complaining of pain "all over". The patient is unable to specify where her pain is the worst and also does not recall her fall. History was thus obtained from the patient's husband, Victoria Tyler, at the bedside. Per the patient's husband, the patient was walking outside on her own, which she routinely does. He believes that she sustained a mechanical fall, as she has had gait issues (describes a shuffling gait) and has been unsteady on her feet recently. The neighbors saw the patient on the ground and were unsure if she lost consciousness, but reported that she was quiet for about a minute and then started screaming out for help. The patient sustained a laceration to her left forehead with significant bleeding (not on anticoagulation). Unable to complete ROS given the patient's advanced dementia. Victoria Tyler also notes that Victoria Tyler memory has been declining over the last year. She previously was following with a neurologist, however, this became cost prohibitive for them, thus they stopped going to appointments. At her baseline, the patient is able to recognize her husband, but has recently had difficulties recognizing her son.  ? ?The patient's  husband notes that he has been trying to get the patient into a nursing facility or memory care facility, however, he has had difficulty with this recently.  ? ? ?Meds:  ?Alendronate 70 mg weekly ?Vitamin C ?Calcium 600 mg  ?Aspirin 81 mg ?Omeprazole 40 mg  ?Vitamin D daily ?Zoloft 25 mg daily  ?Lasix 20 mg daily ?Norco 5-325 mg prn ?Hydroxyzine 25 mg bid prn ?Lisinopril 20 mg daily ?Meloxicam 7.5 mg daily ?Metformin 500 mg bid ?Jardiance 10 mg daily  ?Oxybutynin 5 mg bid ?Kclor 10 mEq ?Prednisone 20 mg daily ?Seroquel 25 mg (on med list) ?Alprazolam 0.25 mg qhs (on med list) ? ?No outpatient medications have been marked as taking for the 07/07/21 encounter [facility] Encounter).  ? ? ? ?Allergies: ?Allergies as of 07/07/2021 - Review Complete 07/07/2021  ?Allergen Reaction Noted  ? Ciprofloxacin  04/24/2014  ? ?Past Medical History:  ?Diagnosis Date  ? Diabetes mellitus without complication ([place])   ? Hypertension   ? ? ?Family History: cardiovascular disease ? ?Social History: Lives with husband. Independent of most ADLs; requires some assistance with ambulation and all IADLs ?PCP: Dr. Luciana Axe through [facility] ?No tobacco, alcohol, or illicit substance use.  ? ?Review of Systems: ?Unable to complete ROS given the patient's advanced dementia ? ?Physical Exam: ?Blood pressure 125/73, pulse 98, temperature 97.8 ?F (36.6 ?C), temperature source Axillary, resp. rate (!) 26, SpO2 (!) 87 %. ? ?General: Elderly  female in acute distress. Anxious and in pain. Continues to take off her clothes repeatedly after she is dressed by staff.  ?HENT: Laceration over left forehead with significant dried blood, no sutures in place yet. Left eye with significant ecchymosis and edema, unable to open.  ?CV: Regular rate, irregular rhythm. No murmurs appreciated.  ?Pulmonary: Tachypnea. Lungs CTAB.  ?Abdominal: Soft, nontender, nondistended. Normal bowel sounds. ?Extremities: Tenderness around left shoulder and arm. Unable to move L arm.   ?Skin: Warm and dry. No obvious rash or lesions. ?Neuro: Alert and oriented to self (baseline). Occasional dysarthric speech. No cranial nerve deficit appreciated (no facial droop, EOMI on right/unable to open left eye, sensation intact, uvula is midline) ?Psych: Anxious, agitated.  ? ? ?EKG: personally reviewed my interpretation is normal sinus rhythm ? ?CXR: not obtained ? ? ?  Latest Ref Rng & Units 07/07/2021  ?  1:35 PM 04/04/2021  ? 11:28 AM 03/31/2021  ?  1:00 AM  ?BMP  ?Glucose 70 - 99 mg/dL 474   284   200    ?BUN 8 - 23 mg/dL 44   27   22    ?Creatinine 0.44 - 1.00 mg/dL 1.08   1.01   0.95    ?Sodium 135 - 145 mmol/L 131   133   133    ?Potassium 3.5 - 5.1 mmol/L 4.7   4.8   3.1    ?Chloride 98 - 111 mmol/L 96   101   98    ?CO2 22 - 32 mmol/L 19   23   26     ?Calcium 8.9 - 10.3 mg/dL 9.4   9.0   9.0    ?Anion gap: 16 ? ?CBC ?   ?Component Value Date/Time  ? WBC 11.1 (H) 07/07/2021 1335  ? RBC 4.31 07/07/2021 1335  ? HGB 13.6 07/07/2021 1335  ? HCT 39.6 07/07/2021 1335  ? PLT 297 07/07/2021 1335  ? MCV 91.9 07/07/2021 1335  ? MCH 31.6 07/07/2021 1335  ? MCHC 34.3 07/07/2021 1335  ? RDW 12.3 07/07/2021 1335  ? LYMPHSABS 1.4 04/04/2021 1128  ? MONOABS 0.9 04/04/2021 1128  ? EOSABS 0.1 04/04/2021 1128  ? BASOSABS 0.0 04/04/2021 1128  ? ?Urinalysis ?   ?Component Value Date/Time  ? COLORURINE STRAW (A) 07/07/2021 1446  ? APPEARANCEUR CLEAR 07/07/2021 1446  ? LABSPEC 1.026 07/07/2021 1446  ? PHURINE 6.0 07/07/2021 1446  ? GLUCOSEU >=500 (A) 07/07/2021 1446  ? [place] NEGATIVE 07/07/2021 1446  ? [place] NEGATIVE 07/07/2021 1446  ? KETONESUR 20 (A) 07/07/2021 1446  ? [place] NEGATIVE 07/07/2021 1446  ? UROBILINOGEN 0.2 04/24/2014 0843  ? NITRITE NEGATIVE 07/07/2021 1446  ? LEUKOCYTESUR NEGATIVE 07/07/2021 1446  ? ? ? ?Assessment & Plan by Problem: ?Principal Problem: ?  Fall ? ?#Fall, unwitnessed ?#Comminuted fracture of proximal left humerus ?Patient presented after a presumed mechanical fall (unwitnessed)  and was found to have a laceration over her left forehead. Significant bleeding was noted and the ED providers are planning to place sutures prior to the patient being admitted. CT head/CT maxillofacial/CT cervical spine were negative for any acute intracranial pathology and did not show any fractures/dislocations of the facial bones or cervical spine. Left shoulder xray did show a comminuted fracture in the neck of the proximal left humerus- likely can see ortho as an outpatient and will keep the arm in a sling/immobilizer in the interim. Thoracic and lumbar xrays with no acute osseous injury, but did note chronic degenerative changes and  chronic anterolisthesis at L4-5 and L5-S1. Suspect that the patient's fall is multifactorial, in the setting of her shuffling gait/balance issues, troubles with depth perception (per husband), and possible neuropathy from uncontrolled diabetes/DKA.  ?- Left shoulder sling/immobilizer ?- Tdap booster given in ED ?- CMP, Vitamin D, B12, TSH, and lactic acid pending ?- PT/OT eval and treat ? ?#Uncontrolled type 2 diabetes ?#Mild DKA ?Last A1c was checked a few days ago and was elevated to 13.2. Patient is on metformin 500 mg bid and jardiance 10 mg daily at home. Glucose level 474 on admission BMP, with bicarb level mildly low at 19 and anion gap of 16. Urinalysis with >500 glucose and 20 ketones. Beta-hydroxybutyric acid level slightly elevated at 1.01. Patient is also tachypneic, with RR in the 20-30s upon my evaluation. I am concerned that the patient may have a mild case of DKA. Patient already received a fluid bolus in the ED and we will start Endotool now.  ?- Endotool per protocol ?- BMP/CBGs now and then q4h ?- Transition off endotool and onto subcutaneous insulin once anion gap closed x2 or once BHB normalizes ? ?#Dementia ?Per the patient's husband, her memory has declined over the last year, to the point that she has not been able to recognize her son recently. She no  longer regularly follows with neurology as this became cost prohibitive to them, but she does still regularly see her PCP. The patient has been noted to leave her home in the middle of the night and also will t

## 2021-07-07 NOTE — Hospital Course (Addendum)
#  Unwitnessed fall ?#Comminuted fracture of proximal left humerus  ?Patient presented after a presumed mechanical fall (unwitnessed) and was found to have a laceration over her left forehead. Significant bleeding was noted and the ED providers are planning to place sutures prior to the patient being admitted. CT head/CT maxillofacial/CT cervical spine were negative for any acute intracranial pathology and did not show any fractures/dislocations of the facial bones or cervical spine. Left shoulder xray did show a comminuted fracture in the neck of the proximal left humerus- likely can see ortho as an outpatient and we strongly recommend that she keep the left arm in a sling/immobilizer in the interim. Thoracic and lumbar xrays with no acute osseous injury, but did note chronic degenerative changes and chronic anterolisthesis at L4-5 and L5-S1. Suspect that the patient's fall is multifactorial, in the setting of her shuffling gait/balance issues, troubles with depth perception (per husband), and possible neuropathy from uncontrolled diabetes/DKA. PT/OT recommending SNF for this patient.  ?  ?#Uncontrolled type 2 diabetes ?#Mild DKA, resolved ?A1c 13.2. Patient is on metformin 500 mg bid and jardiance 10 mg daily at home. Glucose level 474 on admission BMP, with bicarb level mildly low at 19 and anion gap of 16. Urinalysis with >500 glucose and 20 ketones. Beta-hydroxybutyric acid level slightly elevated at 1.01. Patient is also tachypneic, with RR in the 20-30s upon my initial. evaluation Patient was in a mild case of DKA on admission and was started on Endotool. She was transitioned off of the Endotool on the morning of 3/26 (anion gap closed x2) and started on subcutaneous insulin and a carb modified diet. Will discharge with Semglee 14u daily and her home metformin and jardiance. She will need to follow up with her PCP regarding this.  ? ?#Lactic acidosis, resolved ?Lactic acid peaked at 4.7 and improved to 3.8 on  3/26 with IVF fluids. Clinically, the patient appeared well and she received IV fluids with the Endotool. Will hold off on further fluids at this time, as she is euvolemic. Lactic acid improved on 3/27 to 1.1.  ?  ?#Dementia ?The patient's memory has declined over the last year, per her husband. He has been trying to get the patient into a long-term nursing facility or memory care unit, as he has been having difficulties caring for her on his own. Started on her home Seroquel 25 mg qhs and Xanax 0.25 mg qhs. Delirium precautions were applied.  ?  ?#Elevated BUN, resolved ?BUN elevated at 44 on admission, likely in the setting of dehydration from mild DKA. Renal function was normal on the day of discharge.  ?  ?#Hypertension ?BP 110-150s. Recommend checking BP on right arm, as patient has a left humerus fracture and checking BP on this arm causes significant pain. Continued home lisinopril 10 mg daily. ?

## 2021-07-07 NOTE — ED Triage Notes (Signed)
Pt bib [facility] from home, s/p unwitnessed fall, hx dementia, lac to head, Right index finger deformity noted.  Pt c/o LUE pain. Per EMS AOx1 at baseline. ?

## 2021-07-07 NOTE — Progress Notes (Signed)
Orthopedic Tech Progress Note ?Patient Details:  ?Victoria Tyler ?11-02-1940 ?[id number] ?Unable to apply a sling at this time  ?Patient ID: [name], female   DOB: May 21, 1940, 81 y.o.   MRN: [id number] ? ?Xylina Rhoads E Teja Judice ?07/07/2021, 4:13 PM ? ?

## 2021-07-07 NOTE — ED Notes (Signed)
Per Dr. Lynelle Doctor okay to send to xray/CT before obtaining EKG. Pt not tolerating C collar well due to dementia. ?

## 2021-07-07 NOTE — ED Provider Notes (Signed)
?[facility] ?Provider Note ? ? ?CSN: [id number] ?Arrival date & time: 07/07/21  1308 ? ?  ? ?History ? ?Chief Complaint  ?Patient presents with  ? Fall  ? Head Laceration  ? Shoulder Pain  ? ? ?Victoria Tyler is a 81 y.o. female. ? ? ?Fall ? ?Head Laceration ? ?Shoulder Pain ?Associated symptoms: no fever   ? ?Patient has history of diabetes hypertension dementia and presents to the ED with complaints after an unwitnessed fall.  Is awake but is unable to tell me what happened.  She is complaining of pain in her shoulder and her head.  Patient was asking for help due to her pain.  Per EMS patient lives at home with her husband.  The fall was not witnessed but she did sustain a laceration to her left forehead with significant bleeding.  Patient denies any chest pain.  No abdominal pain ? ?Home Medications ?Prior to Admission medications   ?Medication Sig Start Date End Date Taking? Authorizing Provider  ?alendronate (FOSAMAX) 70 MG tablet Take 70 mg by mouth once a week.  03/24/14   [provider]  ?Ascorbic Acid (VITAMIN C) 1000 MG tablet Take 1,000 mg by mouth daily.    [provider]  ?aspirin (GOODSENSE ASPIRIN) 81 MG chewable tablet Chew 81 mg by mouth daily.    [provider]  ?beta carotene w/minerals (OCUVITE) tablet Take 1 tablet by mouth daily.    [provider]  ?Calcium Carbonate-Vitamin D 600-400 MG-UNIT tablet Take 1 tablet by mouth daily.    [provider]  ?Cholecalciferol (VITAMIN D3) 1000 units CAPS Take 1,000 Units by mouth daily.    [provider]  ?[employer] ARTHRITIS PAIN RELIEF 650 MG CR tablet Take 650 mg by mouth every 8 (eight) hours as needed for pain.  12/16/18   [provider]  ?doxycycline (VIBRAMYCIN) 100 MG capsule Take 100 mg by mouth daily.  11/03/18   [provider]  ?escitalopram (LEXAPRO) 10 MG tablet Take by mouth. 03/18/19   [provider]  ?furosemide (LASIX) 20 MG  tablet Take by mouth. 03/08/19   [provider]  ?HYDROcodone-acetaminophen (NORCO/VICODIN) 5-325 MG tablet Take one tablet once or twice a day as needed for more severe pain 04/12/19   [provider]  ?hydrOXYzine (ATARAX/VISTARIL) 25 MG tablet Take 1 tablet (25 mg total) by mouth 2 (two) times daily as needed. 01/18/19   [place] Crane, MD  ?lactulose (CHRONULAC) 10 GM/15ML solution Take 10 g by mouth daily as needed for moderate constipation.  01/12/19   [provider]  ?Rolan Lipa 72 MCG capsule  05/18/19   [provider]  ?lisinopril (PRINIVIL,ZESTRIL) 20 MG tablet Take 20 mg by mouth daily. 10/03/15   [provider]  ?meloxicam (MOBIC) 7.5 MG tablet Take 7.5 mg by mouth daily. 01/10/19   [provider]  ?metFORMIN (GLUCOPHAGE) 500 MG tablet TAKE 1 TABLET BY MOUTH DAILY FOR 7 DAYS, THEN TAKE ONE TWICE DAILY WITH MEALS 01/22/19   [provider]  ?omeprazole (PRILOSEC) 40 MG capsule Take 40 mg by mouth 2 (two) times daily.     [provider]  ?oxybutynin (DITROPAN) 5 MG tablet Take 5 mg by mouth 2 (two) times daily. 10/19/15   [provider]  ?potassium chloride (KLOR-CON) 10 MEQ tablet Take by mouth. 04/04/19   [provider]  ?predniSONE (DELTASONE) 20 MG tablet Take 20 mg by mouth daily. 02/25/19   [provider]  ?traZODone (DESYREL) 50 MG tablet TAKE 1 TABLET BY MOUTH EVERY DAY AT NIGHT 03/18/19   [provider]  ?triamcinolone (KENALOG) 0.1 % paste AAA BID-TID PRN 04/01/19   [provider]  ?trimethoprim (TRIMPEX) 100 MG tablet Take 100 mg by mouth daily.    [provider]  ?   ? ?Allergies    ?Ciprofloxacin   ? ?Review of Systems   ?Review of Systems  ?Constitutional:  Negative for fever.  ? ?Physical Exam ?Updated Vital Signs ?BP 125/73   Pulse 98   Temp 97.8 ?F (36.6 ?C) (Axillary)   Resp (!) 26   SpO2 (!) 87%  ?Physical Exam ?Vitals and nursing note reviewed.   ?Constitutional:   ?   General: She is in acute distress.  ?   Appearance: She is well-developed.  ?   Comments: Anxious and pain  ?HENT:  ?   Head: Normocephalic.  ?   Comments: Laceration noted left lateral forehead temporal region, dried blood on face and extremities, no active bleeding at this time ?   Right Ear: External ear normal.  ?   Left Ear: External ear normal.  ?Eyes:  ?   General: No scleral icterus.    ?   Right eye: No discharge.     ?   Left eye: No discharge.  ?   Conjunctiva/sclera: Conjunctivae normal.  ?Neck:  ?   Trachea: No tracheal deviation.  ?Cardiovascular:  ?   Rate and Rhythm: Normal rate and regular rhythm.  ?Pulmonary:  ?   Effort: Pulmonary effort is normal. No respiratory distress.  ?   Breath sounds: Normal breath sounds. No stridor. No wheezing or rales.  ?Abdominal:  ?   General: Bowel sounds are normal. There is no distension.  ?   Palpations: Abdomen is soft.  ?   Tenderness: There is no abdominal tenderness. There is no guarding or rebound.  ?Musculoskeletal:     ?   General: Tenderness present.  ?   Cervical back: Neck supple.  ?   Comments: Deformity noted to right index finger however patient does not have any tenderness, questionable chronic; tenderness palpation proximal humerus left shoulder region; thoracic and lumbar spine palpated initially patient indicates she had pain and then on repeat exam denied pain; no tenderness palpation bilateral lower extremities, no tenderness bilateral wrists  ?Skin: ?   General: Skin is warm and dry.  ?   Findings: No rash.  ?Neurological:  ?   General: No focal deficit present.  ?   Mental Status: She is alert. Mental status is at baseline.  ?   Cranial Nerves: No cranial nerve deficit (no facial droop, extraocular movements intact, no slurred speech).  ?   Sensory: No sensory deficit.  ?   Motor: No abnormal muscle tone or seizure activity.  ?   Coordination: Coordination normal.  ?Psychiatric:  ?   Comments: Anxious  ? ? ?ED Results  / Procedures / Treatments   ?Labs ?(all labs ordered are listed, but only abnormal results are displayed) ?Labs Reviewed  ?CBC - Abnormal; Notable for the following components:  ?    Result Value  ? WBC 11.1 (*)   ? All other components within normal limits  ?BASIC METABOLIC PANEL - Abnormal; Notable for the following components:  ? Sodium 131 (*)   ? Chloride 96 (*)   ? CO2 19 (*)   ? Glucose, Bld 474 (*)   ? BUN 44 (*)   ?  Creatinine, Ser 1.08 (*)   ? GFR, Estimated 52 (*)   ? Anion gap 16 (*)   ? All other components within normal limits  ?URINALYSIS, ROUTINE W REFLEX MICROSCOPIC  ? ? ?EKG ?None ? ?Radiology ?DG Thoracic Spine 2 View ? ?Result Date: 07/07/2021 ?CLINICAL DATA:  Fall with back pain. EXAM: THORACIC SPINE 2 VIEWS COMPARISON:  Chest radiograph dated 09/26/2017. FINDINGS: There is no evidence of thoracic spine fracture. Alignment is normal. Deformity of the posterolateral left fourth through sixth ribs is chronic. Degenerative changes are seen in the spine. IMPRESSION: No acute osseous injury. Electronically Signed   By: Zerita Boers M.D.   On: 07/07/2021 14:53  ? ?DG Lumbar Spine Complete ? ?Result Date: 07/07/2021 ?CLINICAL DATA:  Fall with pain. EXAM: LUMBAR SPINE - COMPLETE 4+ VIEW COMPARISON:  CT abdomen pelvis dated 02/04/2019 FINDINGS: There is no evidence of lumbar spine fracture. There is 4 mm anterolisthesis of L4 on L5 and 11 mm anterolisthesis of L5 on S1. Anterolisthesis of L5 on S1 appears increased since 02/04/2019 given differences in technique. Moderate to severe degenerative disc and joint disease is seen in the lower lumbar spine. Stool overlies the rectum. IMPRESSION: No acute osseous injury. Chronic degenerative changes and chronic appearing anterolisthesis at L4-5 and L5-S1. Electronically Signed   By: Zerita Boers M.D.   On: 07/07/2021 14:50  ? ?CT Head Wo Contrast ? ?Result Date: 07/07/2021 ?CLINICAL DATA:  Fall, head trauma, left forehead laceration EXAM: CT HEAD WITHOUT  CONTRAST CT MAXILLOFACIAL WITHOUT CONTRAST CT CERVICAL SPINE WITHOUT CONTRAST TECHNIQUE: Multidetector CT imaging of the head, cervical spine, and maxillofacial structures were performed using the standard protocol without intr

## 2021-07-08 ENCOUNTER — Other Ambulatory Visit: Payer: Self-pay

## 2021-07-08 DIAGNOSIS — S42292A Other displaced fracture of upper end of left humerus, initial encounter for closed fracture: Secondary | ICD-10-CM | POA: Diagnosis not present

## 2021-07-08 DIAGNOSIS — S42202A Unspecified fracture of upper end of left humerus, initial encounter for closed fracture: Secondary | ICD-10-CM

## 2021-07-08 DIAGNOSIS — S060XAA Concussion with loss of consciousness status unknown, initial encounter: Secondary | ICD-10-CM | POA: Diagnosis not present

## 2021-07-08 DIAGNOSIS — I10 Essential (primary) hypertension: Secondary | ICD-10-CM | POA: Diagnosis not present

## 2021-07-08 DIAGNOSIS — S0181XA Laceration without foreign body of other part of head, initial encounter: Secondary | ICD-10-CM | POA: Diagnosis not present

## 2021-07-08 DIAGNOSIS — N179 Acute kidney failure, unspecified: Secondary | ICD-10-CM | POA: Diagnosis not present

## 2021-07-08 LAB — CBC
HCT: 36.7 % (ref 36.0–46.0)
Hemoglobin: 12.3 g/dL (ref 12.0–15.0)
MCH: 31.4 pg (ref 26.0–34.0)
MCHC: 33.5 g/dL (ref 30.0–36.0)
MCV: 93.6 fL (ref 80.0–100.0)
Platelets: 275 10*3/uL (ref 150–400)
RBC: 3.92 MIL/uL (ref 3.87–5.11)
RDW: 12.8 % (ref 11.5–15.5)
WBC: 12.9 10*3/uL — ABNORMAL HIGH (ref 4.0–10.5)
nRBC: 0 % (ref 0.0–0.2)

## 2021-07-08 LAB — BASIC METABOLIC PANEL
Anion gap: 12 (ref 5–15)
Anion gap: 11 (ref 5–15)
Anion gap: 13 (ref 5–15)
BUN: 35 mg/dL — ABNORMAL HIGH (ref 8–23)
BUN: 33 mg/dL — ABNORMAL HIGH (ref 8–23)
BUN: 28 mg/dL — ABNORMAL HIGH (ref 8–23)
CO2: 17 mmol/L — ABNORMAL LOW (ref 22–32)
CO2: 19 mmol/L — ABNORMAL LOW (ref 22–32)
CO2: 17 mmol/L — ABNORMAL LOW (ref 22–32)
Calcium: 9.4 mg/dL (ref 8.9–10.3)
Calcium: 9 mg/dL (ref 8.9–10.3)
Calcium: 9 mg/dL (ref 8.9–10.3)
Chloride: 109 mmol/L (ref 98–111)
Chloride: 109 mmol/L (ref 98–111)
Chloride: 107 mmol/L (ref 98–111)
Creatinine, Ser: 0.95 mg/dL (ref 0.44–1.00)
Creatinine, Ser: 0.92 mg/dL (ref 0.44–1.00)
Creatinine, Ser: 0.86 mg/dL (ref 0.44–1.00)
GFR, Estimated: >60 mL/min (ref >60)
GFR, Estimated: >60 mL/min (ref >60)
GFR, Estimated: >60 mL/min (ref >60)
Glucose, Bld: 141 mg/dL — ABNORMAL HIGH (ref 70–99)
Glucose, Bld: 143 mg/dL — ABNORMAL HIGH (ref 70–99)
Glucose, Bld: 193 mg/dL — ABNORMAL HIGH (ref 70–99)
Potassium: 4.6 mmol/L (ref 3.5–5.1)
Potassium: 4.5 mmol/L (ref 3.5–5.1)
Potassium: 5.1 mmol/L (ref 3.5–5.1)
Sodium: 138 mmol/L (ref 135–145)
Sodium: 139 mmol/L (ref 135–145)
Sodium: 137 mmol/L (ref 135–145)

## 2021-07-08 LAB — GLUCOSE, CAPILLARY
Glucose-Capillary: 132 mg/dL — ABNORMAL HIGH (ref 70–99)
Glucose-Capillary: 158 mg/dL — ABNORMAL HIGH (ref 70–99)
Glucose-Capillary: 165 mg/dL — ABNORMAL HIGH (ref 70–99)
Glucose-Capillary: 167 mg/dL — ABNORMAL HIGH (ref 70–99)
Glucose-Capillary: 168 mg/dL — ABNORMAL HIGH (ref 70–99)
Glucose-Capillary: 178 mg/dL — ABNORMAL HIGH (ref 70–99)
Glucose-Capillary: 192 mg/dL — ABNORMAL HIGH (ref 70–99)
Glucose-Capillary: 197 mg/dL — ABNORMAL HIGH (ref 70–99)
Glucose-Capillary: 200 mg/dL — ABNORMAL HIGH (ref 70–99)
Glucose-Capillary: 81 mg/dL (ref 70–99)
Glucose-Capillary: 95 mg/dL (ref 70–99)

## 2021-07-08 LAB — MAGNESIUM: Magnesium: 2.1 mg/dL (ref 1.7–2.4)

## 2021-07-08 LAB — T4, FREE: Free T4: 0.94 ng/dL (ref 0.61–1.12)

## 2021-07-08 LAB — LACTIC ACID, PLASMA
Lactic Acid, Venous: 4.7 mmol/L (ref 0.5–1.9)
Lactic Acid, Venous: 3.8 mmol/L (ref 0.5–1.9)

## 2021-07-08 MED ORDER — INSULIN ASPART 100 UNIT/ML IJ SOLN: 4.0000 [IU] | Freq: Three times a day (TID) | INTRAMUSCULAR | Status: DC

## 2021-07-08 MED ORDER — INSULIN DETEMIR 100 UNIT/ML ~~LOC~~ SOLN: 0.3000 [IU]/kg | SUBCUTANEOUS | Status: DC

## 2021-07-08 MED ORDER — INSULIN ASPART 100 UNIT/ML IJ SOLN
0.0000 [IU] | Freq: Three times a day (TID) | INTRAMUSCULAR | Status: DC
  Administered 2021-07-08 (×2): 3 [IU] via SUBCUTANEOUS
  Administered 2021-07-09 (×2): 5 [IU] via SUBCUTANEOUS
  Administered 2021-07-09: 3 [IU] via SUBCUTANEOUS

## 2021-07-08 MED ORDER — INSULIN ASPART 100 UNIT/ML IJ SOLN: 0.0000 [IU] | Freq: Every day | INTRAMUSCULAR | Status: DC

## 2021-07-08 MED ORDER — INSULIN GLARGINE-YFGN 100 UNIT/ML ~~LOC~~ SOLN
10.0000 [IU] | Freq: Every day | SUBCUTANEOUS | Status: DC
  Administered 2021-07-08: 10 [IU] via SUBCUTANEOUS
  Filled 2021-07-08: qty 0.1

## 2021-07-08 MED ORDER — LACTATED RINGERS IV SOLN: INTRAVENOUS | Status: DC

## 2021-07-08 MED ORDER — INSULIN GLARGINE-YFGN 100 UNIT/ML ~~LOC~~ SOLN
10.0000 [IU] | Freq: Every day | SUBCUTANEOUS | Status: DC
  Filled 2021-07-08: qty 0.1

## 2021-07-08 NOTE — Progress Notes (Signed)
OT Cancellation Note ? ?Patient Details ?Name: Victoria Tyler ?MRN: [id number] ?DOB: 03-Mar-1941 ? ? ?Cancelled Treatment:    Reason Eval/Treat Not Completed: Patient at procedure or test/ unavailable (Pt getting labs drawn upon arrival, OT evaluation to f/u as appropriate.) ? ?[name] ?07/08/2021, 10:43 AM ?

## 2021-07-08 NOTE — Progress Notes (Addendum)
Awaiting BMP results. Dr. Alroy Bailiff and Dr. Evie Lacks notified. Per Dr. Evie Lacks keep insulin drip at 5.5 units/hr until results are received for BMP. ?

## 2021-07-08 NOTE — Progress Notes (Signed)
Per Dr. Alroy Bailiff continue insulin drip at current rate and feed patient. New orders noted. ?

## 2021-07-08 NOTE — Evaluation (Signed)
Occupational Therapy Evaluation ?Patient Details ?Name: Victoria GardenerSandra Tyler ?MRN: [id number] ?DOB: 09/16/1940 ?Today's Date: 07/08/2021 ? ? ?History of Present Illness Victoria GardenerSandra Pair is an 81 yo female [facility]. pt with forehead laceration and  proximal humerus fracture. Pt with dementia, hypertension, and uncontrolled type 2 diabetes  ? ?Clinical Impression ?  ?Victoria DavenportSandra was evaluated s/p the above admission list, she is a poor historian but poor the chart she lives at home with her husband who assists her with ADLs and mobility as needed. Pt was pleasantly confused throughout, oriented to her self only. She was able to follow most simple commands with cues fro attention and safety. Overall she requires mod-max A+2 for all ADLs and functional mobility. Pt able to transfer to EOB with max A with fair sitting balance, unable to progress to standing due to pt reporting increased anxiety. LUE sling donned for entire session, elbow wrist and hand ROM limited due to pain. L NWB and no ROM of L shoulder maintained. Pt will benefit from continued OT acutely. Recommend d/c to SNF/memory care.  ?   ? ?Recommendations for follow up therapy are one component of a multi-disciplinary discharge planning process, led by the attending physician.  Recommendations may be updated based on patient status, additional functional criteria and insurance authorization.  ? ?Follow Up Recommendations ? Skilled nursing-short term rehab (<3 hours/day)  ?  ?   ?Patient can return home with the following A lot of help with walking and/or transfers;A lot of help with bathing/dressing/bathroom;Assistance with cooking/housework;Direct supervision/assist for medications management;Direct supervision/assist for financial management;Assist for transportation;Help with stairs or ramp for entrance ? ?  ?Functional Status Assessment ? Patient has had a recent decline in their functional status and demonstrates the ability to make  significant improvements in function in a reasonable and predictable amount of time.  ?Equipment Recommendations ? Other (comment) (defer)  ?  ?Recommendations for Other Services   ? ? ?  ?Precautions / Restrictions Precautions ?Precautions: Fall ?Restrictions ?Weight Bearing Restrictions: Yes ?LUE Weight Bearing: Non weight bearing ?Other Position/Activity Restrictions: no orders placed, conservatively maintained NWB.  ? ?  ? ?Mobility Bed Mobility ?Overal bed mobility: Needs Assistance ?Bed Mobility: Supine to Sit, Sit to Supine ?  ?  ?Supine to sit: Max assist ?Sit to supine: Max assist ?  ?General bed mobility comments: assist for all aspects, did assist some with LLE management ?  ? ?Transfers ?Overall transfer level: Needs assistance ?  ?  ?  ?  ?  ?  ?  ?  ?General transfer comment: defer, pt refusing after initiation of stand ?  ? ?  ?Balance Overall balance assessment: Needs assistance ?Sitting-balance support: Feet supported ?Sitting balance-Leahy Scale: Fair ?Sitting balance - Comments: statically sat EOB for ~5 minutes, no overt LOB ?  ?  ?  ?  ?  ?  ?  ?  ?  ?  ?  ?  ?  ?  ?  ?   ? ?ADL either performed or assessed with clinical judgement  ? ?ADL Overall ADL's : Needs assistance/impaired ?Eating/Feeding: Moderate assistance;Sitting ?  ?Grooming: Moderate assistance;Sitting ?  ?Upper Body Bathing: Moderate assistance;Sitting ?  ?Lower Body Bathing: Maximal assistance;+2 for physical assistance;+2 for safety/equipment;Sit to/from stand ?  ?Upper Body Dressing : Sitting;Maximal assistance ?Upper Body Dressing Details (indicate cue type and reason): to manage LUE ?Lower Body Dressing: Maximal assistance ?  ?Toilet Transfer: Maximal assistance;+2 for physical assistance;+2 for safety/equipment ?  ?  Toileting- Clothing Manipulation and Hygiene: Maximal assistance;+2 for physical assistance;+2 for safety/equipment;Sit to/from stand ?  ?  ?  ?Functional mobility during ADLs: Maximal assistance;+2 for physical  assistance;+2 for safety/equipment ?General ADL Comments: pt too anxious to attempt OOB this date - was able to transfer to EOB wtih max A and fair sitting balance. due to imapired cognition at baseline pt requires maximal cues throughout.  ? ? ? ?Vision Baseline Vision/History: 0 No visual deficits ?Ability to See in Adequate Light: 0 Adequate ?Additional Comments: difficult to fully assess due to poor command following. seemed [facility] for tasks assessed. Pt tracking this therapist in all quadrants  ?   ?Perception   ?  ?Praxis   ?  ? ?Pertinent Vitals/Pain Pain Assessment ?Pain Assessment: Faces ?Faces Pain Scale: Hurts little more ?Pain Location: L shoulder with any movement of body, also L flank and hip with bed mobility ?Pain Descriptors / Indicators: Grimacing, Discomfort, Moaning ?Pain Intervention(s): Limited activity within patient's tolerance, Monitored during session  ? ? ? ?Hand Dominance Right ?  ?Extremity/Trunk Assessment Upper Extremity Assessment ?Upper Extremity Assessment: LUE deficits/detail ?LUE Deficits / Details: proximal humerus fx - attempted AAROM of elbow, wrist and hand however very limited due to pain at the shoulder ?LUE: Unable to fully assess due to immobilization ?LUE Coordination: decreased fine motor;decreased gross motor ?  ?Lower Extremity Assessment ?Lower Extremity Assessment: Defer to PT evaluation ?  ?Cervical / Trunk Assessment ?Cervical / Trunk Assessment: Kyphotic ?  ?Communication Communication ?Communication: No difficulties ?  ?Cognition Arousal/Alertness: Awake/alert ?Behavior During Therapy: Anxious ?Overall Cognitive Status: History of cognitive impairments - at baseline ?  ?  ?  ?  ?  ?  ?  ?  ?  ?  ?  ?  ?  ?  ?  ?  ?General Comments: Pt pleasantly confused, oriented to self only. thinking she is at her doctors office. Pt follows one step commands well wtih short attention to tasks and benefits from cues to re-direct to task. Pt noted to be very anxious once  transferred to EOB. ?  ?  ?General Comments  VSS on RA - pt wtih 1 mitt off and sling off upon arrival. R IV site and bed pad noted to be saturated with blood. RN notified. ? ?  ?Exercises   ?  ?Shoulder Instructions    ? ? ?Home Living Family/patient expects to be discharged to:: Private residence ?Living Arrangements: Spouse/significant other ?Available Help at Discharge: Available 24 hours/day;Family ?Type of Home: Apartment ?Home Access: Level entry ?  ?  ?Home Layout: One level ?  ?  ?Bathroom Shower/Tub: Sponge bathes at baseline (sponge bathes at the tub?) ?  ?Bathroom Toilet: Standard ?  ?  ?Home Equipment: Gilmer Mor - single point ?  ?  ?  ? ?  ?Prior Functioning/Environment Prior Level of Function : Independent/Modified Independent ?  ?  ?  ?  ?  ?  ?Mobility Comments: SPC for mobility ?ADLs Comments: initially stated indep, then stated her husband assists with dressing every morning. ?  ? ?  ?  ?OT Problem List: Decreased strength;Impaired balance (sitting and/or standing);Decreased range of motion;Decreased activity tolerance;Decreased cognition;Decreased safety awareness;Decreased knowledge of use of DME or AE;Decreased knowledge of precautions;Pain ?  ?   ?OT Treatment/Interventions: Self-care/ADL training;Therapeutic exercise;Balance training;Patient/family education;Therapeutic activities;DME and/or AE instruction  ?  ?OT Goals(Current goals can be found in the care plan section) Acute Rehab OT Goals ?Patient Stated Goal: "get up and walk acorss the  room" ?OT Goal Formulation: With patient ?Time For Goal Achievement: 07/22/21 ?Potential to Achieve Goals: Good  ?OT Frequency: Min 2X/week ?  ? ?Co-evaluation   ?  ?  ?  ?  ? ?  ?AM-PAC OT "6 Clicks" Daily Activity     ?Outcome Measure Help from another person eating meals?: A Lot ?Help from another person taking care of personal grooming?: A Lot ?Help from another person toileting, which includes using toliet, bedpan, or urinal?: A Lot ?Help from another  person bathing (including washing, rinsing, drying)?: A Lot ?Help from another person to put on and taking off regular upper body clothing?: A Lot ?Help from another person to put on and taking off regular lower bo

## 2021-07-08 NOTE — Evaluation (Signed)
Physical Therapy Evaluation ?Patient Details ?Name: Victoria Tyler ?MRN: [id number] ?DOB: Apr 16, 1940 ?Today's Date: 07/08/2021 ? ?History of Present Illness ? Victoria Tyler is an 81 yo female who presents to [facility] after an unwitnessed fall. pt with forehead laceration and  proximal humerus fracture. Pt with dementia, hypertension, and uncontrolled type 2 diabetes  ?Clinical Impression ?  ?Pt admitted with above diagnosis. Lives at home with husband , in a single-level home; Prior to admission, pt was able to walk with a single point cane, and participate in ADLs; noted has been experiencing more memory problems, and at times unsafe; Husband has been pursuing Memory Care; Presents to PT with L shoulder pain limiting activity tolerance; Very anxious with getting up to EOB, but able to calm herself once in a stable sitting position; REquires 2 person Max assist for sit to stand, with use of bed pad and knees blocked for stabiltiy; abel to take a few short steps up to [facility] with 2 person assist;  Given pt was walking prior to this admission, and often innate gait patterns are spared until dementia is advanced, Victoria Tyler does show rehab potential, and recommend SNF for post-acute rehab; perhaps she can transition from there to memory care; Pt currently with functional limitations due to the deficits listed below (see PT Problem List). Pt will benefit from skilled PT to increase their independence and safety with mobility to allow discharge to the venue listed below.      ?   ? ?Recommendations for follow up therapy are one component of a multi-disciplinary discharge planning process, led by the attending physician.  Recommendations may be updated based on patient status, additional functional criteria and insurance authorization. ? ?Follow Up Recommendations Skilled nursing-short term rehab (<3 hours/day) ? ?  ?Assistance Recommended at Discharge Frequent or constant Supervision/Assistance  ?Patient can return home with the  following ? A lot of help with walking and/or transfers ? ?  ?Equipment Recommendations BSC/3in1;Wheelchair (measurements PT);Wheelchair cushion (measurements PT) (Will consider hemiwalker vs quad cane)  ?Recommendations for Other Services ? OT consult  ?  ?Functional Status Assessment Patient has had a recent decline in their functional status and demonstrates the ability to make significant improvements in function in a reasonable and predictable amount of time.  ? ?  ?Precautions / Restrictions Precautions ?Precautions: Fall ?Restrictions ?LUE Weight Bearing: Non weight bearing ?Other Position/Activity Restrictions: no orders placed, conservatively maintained NWB.  ? ?  ? ?Mobility ? Bed Mobility ?Overal bed mobility: Needs Assistance ?Bed Mobility: Supine to Sit, Sit to Supine ?  ?  ?Supine to sit: Max assist, +2 for physical assistance ?Sit to supine: Max assist, +2 for physical assistance ?  ?General bed mobility comments: assist for all aspects, did assist some with LLE management ?  ? ?Transfers ?Overall transfer level: Needs assistance ?Equipment used: 2 person hand held assist ?Transfers: Sit to/from Stand ?Sit to Stand: +2 physical assistance, Max assist ?  ?  ?  ?  ?  ?General transfer comment: 2 person assist to stand at EOB; initial attempt cleared hips but did not get to full stand; second attempt able t6o get up to stansign ans take 2 steps towrd EOB ?  ? ?Ambulation/Gait ?  ?  ?  ?  ?  ?  ?  ?  ? ?Stairs ?  ?  ?  ?  ?  ? ?Wheelchair Mobility ?  ? ?Modified Rankin (Stroke Patients Only) ?  ? ?  ? ?Balance   ?  Sitting-balance support: Feet supported ?Sitting balance-Leahy Scale: Fair ?  ?  ?  ?Standing balance-Leahy Scale: Zero ?  ?  ?  ?  ?  ?  ?  ?  ?  ?  ?  ?  ?   ? ? ? ?Pertinent Vitals/Pain Pain Assessment ?Pain Assessment: Faces ?Faces Pain Scale: Hurts whole lot ?Pain Location: L shoulder with any movement of body, also L flank and hip with bed mobility ?Pain Descriptors / Indicators: Grimacing,  Discomfort, Moaning ?Pain Intervention(s): Limited activity within patient's tolerance, Monitored during session, Repositioned  ? ? ?Home Living Family/patient expects to be discharged to:: Private residence ?Living Arrangements: Spouse/significant other ?Available Help at Discharge: Available 24 hours/day;Family ?Type of Home: Apartment ?Home Access: Level entry ?  ?  ?  ?Home Layout: One level ?Home Equipment: Kasandra Knudsen - single point ?   ?  ?Prior Function Prior Level of Function : Independent/Modified Independent ?  ?  ?  ?  ?  ?  ?Mobility Comments: SPC for mobility ?ADLs Comments: initially stated indep, then stated her husband assists with dressing every morning. ?  ? ? ?Hand Dominance  ? Dominant Hand: Right ? ?  ?Extremity/Trunk Assessment  ? Upper Extremity Assessment ?Upper Extremity Assessment: Defer to OT evaluation ?  ? ?Lower Extremity Assessment ?Lower Extremity Assessment: Generalized weakness ?  ? ?Cervical / Trunk Assessment ?Cervical / Trunk Assessment: Kyphotic  ?Communication  ? Communication: No difficulties  ?Cognition Arousal/Alertness: Awake/alert ?Behavior During Therapy: [facility] for tasks assessed/performed, Anxious ?Overall Cognitive Status: History of cognitive impairments - at baseline ?  ?  ?  ?  ?  ?  ?  ?  ?  ?  ?  ?  ?  ?  ?  ?  ?General Comments: Pt pleasantly confused, oriented to self only. thinking she is at her doctors office. Pt follows one step commands well wtih short attention to tasks and benefits from cues to re-direct to task. Pt noted to be very anxious once transferred to EOB. ?  ?  ? ?  ?General Comments General comments (skin integrity, edema, etc.): VSS on room air ? ?  ?Exercises    ? ?Assessment/Plan  ?  ?PT Assessment Patient needs continued PT services  ?PT Problem List Decreased strength;Decreased range of motion;Decreased activity tolerance;Decreased balance;Decreased mobility;Decreased coordination;Decreased cognition;Decreased knowledge of use of DME;Decreased  safety awareness;Decreased knowledge of precautions;Pain ? ?   ?  ?PT Treatment Interventions DME instruction;Gait training;Functional mobility training;Therapeutic activities;Therapeutic exercise;Balance training;Neuromuscular re-education;Cognitive remediation;Patient/family education   ? ?PT Goals (Current goals can be found in the Care Plan section)  ?Acute Rehab PT Goals ?Patient Stated Goal: did not state, but agrees to get up ?PT Goal Formulation: Patient unable to participate in goal setting ?Time For Goal Achievement: 07/22/21 ?Potential to Achieve Goals: Good ? ?  ?Frequency Min 2X/week ?  ? ? ?Co-evaluation   ?  ?  ?  ?  ? ? ?  ?AM-PAC PT "6 Clicks" Mobility  ?Outcome Measure Help needed turning from your back to your side while in a flat bed without using bedrails?: A Lot ?Help needed moving from lying on your back to sitting on the side of a flat bed without using bedrails?: Total ?Help needed moving to and from a bed to a chair (including a wheelchair)?: Total ?Help needed standing up from a chair using your arms (e.g., wheelchair or bedside chair)?: Total ?Help needed to walk in hospital room?: Total ?Help needed climbing 3-5 steps with  a railing? : Total ?6 Click Score: 7 ? ?  ?End of Session Equipment Utilized During Treatment: Gait belt (sling) ?Activity Tolerance: Patient tolerated treatment well (Though quite anxious with standing) ?Patient left: in bed;with call bell/phone within reach;with bed alarm set (bed in semi-cair position) ?Nurse Communication: Mobility status ?PT Visit Diagnosis: Unsteadiness on feet (R26.81);Other abnormalities of gait and mobility (R26.89);History of falling (Z91.81) ?  ? ?Time: [id number] ?PT Time Calculation (min) (ACUTE ONLY): 30 min ? ? ?Charges:   PT Evaluation ?$PT Eval Moderate Complexity: 1 Mod ?PT Treatments ?$Therapeutic Activity: 8-22 mins ?  ?   ? ? ?Roney Marion, PT  ?Acute Rehabilitation Services ?Pager [phone] ?Office [phone] ? ? ?[place] ?07/08/2021, 3:01 PM ? ?

## 2021-07-08 NOTE — Consult Note (Signed)
Reason for Consult: facial laceration ?Referring Physician: Knapp/Kiehl ?Location: [facility] ?Date: 3.26.23 ? ? ?Victoria Tyler is a 81 y.o. female  ? ?HPI: Patient admitted 3.25.23 following unwitnessed fall at home. Suffered laceration left brow repaired by ED. Plastic Surgery consulted for evaluation of this reported not moving left brow well. Patient with uncontrolled DM and dementia. Has been living at home with spouse. ? ?Past Medical History:  ?Diagnosis Date  ? Diabetes mellitus without complication ([place])   ? Hypertension   ?  ? ?Past Surgical History:  ?Procedure Laterality Date  ? HERNIA REPAIR    ? TONSILLECTOMY    ?  ? ? reports that she has never smoked. She has never used smokeless tobacco. She reports that she does not drink alcohol and does not use drugs. ? ?Allergies  ?Allergen Reactions  ? Ciprofloxacin   ?  Hives. Patient reported it was itching at the IV site.  ? Penicillin G   ?  Other reaction(s): Unknown  ?  ? ?Medications: I have reviewed the patient's current medications ? ?I have reviewed labs with HbA1c 13.2 ?  ? ?CT Maxillofacial Wo Contrast ? ?Result Date: 07/07/2021 ?CLINICAL DATA:  Fall, head trauma, left forehead laceration EXAM: CT HEAD WITHOUT CONTRAST CT MAXILLOFACIAL WITHOUT CONTRAST CT CERVICAL SPINE WITHOUT CONTRAST TECHNIQUE: Multidetector CT imaging of the head, cervical spine, and maxillofacial structures were performed using the standard protocol without intravenous contrast. Multiplanar CT image reconstructions of the cervical spine and maxillofacial structures were also generated. RADIATION DOSE REDUCTION: This exam was performed according to the departmental dose-optimization program which includes automated exposure control, adjustment of the mA and/or kV according to patient size and/or use of iterative reconstruction technique. COMPARISON:  03/30/2021 FINDINGS: CT HEAD FINDINGS Brain: No evidence of acute infarction, hemorrhage, hydrocephalus, extra-axial  collection or mass lesion/mass effect. Periventricular and deep white matter hypodensity. Vascular: No hyperdense vessel or unexpected calcification. CT FACIAL BONES FINDINGS Skull: Hyperostosis frontalis. Negative for fracture or focal lesion. Facial bones: No displaced fractures or dislocations. Sinuses/Orbits: Soft tissue contusion overlying the left orbit (series 4, image 66). The orbital contents are grossly intact by CT. Other: Soft tissue laceration of the left forehead (series 3, image 35). CT CERVICAL SPINE FINDINGS Alignment: Normal. Skull base and vertebrae: No acute fracture. No primary bone lesion or focal pathologic process. Soft tissues and spinal canal: No prevertebral fluid or swelling. No visible canal hematoma. Disc levels:  Intact. Upper chest: Negative. Other: None. IMPRESSION: 1. No acute intracranial pathology. Small-vessel white matter disease. 2. No displaced fractures or dislocations of the facial bones. 3. Soft tissue contusion overlying the left orbit. The orbital contents are grossly intact by CT. 4. Soft tissue laceration of the left forehead. 5. No fracture or static subluxation of the cervical spine. Electronically Signed   By: Delanna Ahmadi M.D.   On: 07/07/2021 14:18   ? ?BP (!) 115/54   Pulse (!) 109   Temp (!) 97.3 ?F (36.3 ?C) (Oral)   Resp 20   SpO2 94%   ? ?Physical Exam: ?Gen: asking to get clothes and remove gown, reoriented to hospital setting ?HEENT: EOMI left periororbital ecchymoses with expected edema brow minimal movement frontalis over left/left brow movement, no lagophthalmos ?No septal hematoma ?Incision repair intact ? ?Assessment/Plan: ?S/p repair left brow laceration. Ok to bathe area/ wash with soap and water. Recommend observation for return brow movement. Sutures are dissolvable.  ?Per notes, possible transition to memory care/SNF following discharge. May follow up  post dicharge for check. ? ? ?Irene Limbo, MD MBA ?Plastic & Reconstructive Surgery  ?

## 2021-07-08 NOTE — Progress Notes (Signed)
? ?HD#1 ?SUBJECTIVE:  ?Patient Summary: Victoria Tyler is a 81 y.o. with a pertinent PMH of dementia, uncontrolled diabetes, and hypertension, who presented with a fall and admitted for a fall, left humerus fracture, and mild DKA that has resolved.  ? ?Overnight Events: Patient's anion gap closed x2 and she was transitioned off of Endotool.  ? ?Interim History: This is hospital day 1 for this patient who was seen and evaluated at the bedside this morning. She is more interactive upon evaluation today and notes that her pain is better. She would like to get up and walk around, although we stressed the importance of only ambulating with assistance given her fall risk.  ? ?OBJECTIVE:  ?Vital Signs: ?Vitals:  ? 07/07/21 1859 07/07/21 2005 07/07/21 2330 07/08/21 0331  ?BP: (!) 133/54 98/66 91/71  126/75  ?Pulse: 78 91 94 95  ?Resp: (!) 23 18 20 20   ?Temp:  97.6 ?F (36.4 ?C) 97.8 ?F (36.6 ?C) (!) 97 ?F (36.1 ?C)  ?TempSrc:  Oral Axillary Oral  ?SpO2: 94% 95% 100% 94%  ? ?Supplemental O2: Room Air ?SpO2: 94 % ? ?There were no vitals filed for this visit. ? ? ?Intake/Output Summary (Last 24 hours) at 07/08/2021 0641 ?Last data filed at 07/08/2021 07/10/2021 ?Gross per 24 hour  ?Intake 1013.77 ml  ?Output 350 ml  ?Net 663.77 ml  ? ?Net IO Since Admission: 663.77 mL [07/08/21 0641] ? ?Physical Exam: ?General: Pleasant, elderly female laying comfortably in bed. No acute distress. ?HENT: Laceration over left forehead with sutures in place. Left eye with ecchymosis and edema ?CV: Regular rate, irregular rhythm. No murmurs, rubs, or gallops. No LE edema ?Pulmonary: Lungs CTAB. Normal effort. No wheezing or rales. ?Abdominal: Soft, nontender, nondistended. Normal bowel sounds. ?Extremities: Tenderness over left shoulder and upper arm- limited ROM.  ?Skin: Warm and dry.  ?Neuro: A&Ox3 (self, city and knows the president). Does not know year or that she is in the hospital.  ?Psych: Normal mood and affect ? ? ? ? ?ASSESSMENT/PLAN:   ?Assessment: ?Principal Problem: ?  Fall ? ? ?Plan: ?#Unwitnessed fall ?#Comminuted fracture of proximal left humerus  ?Patient had a mechanical fall and suffered a comminuted fracture in the neck of the proximal left humerus. Will apply left shoulder sling/immobilizer and keep the left shoulder immobile. Likely can follow up with orthopedics as an outpatient regarding this.  ?- Left shoulder sling ?- PT/OT as able ? ?#Uncontrolled type 2 diabetes ?#Mild DKA, resolved ?A1c 13.2. Patient was in a mild case of DKA on admission and was started on Endotool. She was transitioned off of the Endotool early this morning (anion gap closed x2) and started on subcutaneous insulin and a carb modified diet. ?- Semglee 10u daily ?- CBGs four times daily, before meals and at bedtime ?- Continue SSI ? ?#Lactic acidosis  ?Bicarb remains low at 17, despite DKA resolving. Lactic acid remains elevated today- it peaked at 4.7 and most recently is 3.8. Clinically, the patient appears well and received IV fluids with the Endotool. Will hold off on further fluids at this time, as she is euvolemic. ?- Continue to monitor lactic acid ? ?#Dementia ?The patient's memory has declined over the last year, per her husband. He has been trying to get the patient into a long-term nursing facility or memory care unit, as he has been having difficulties caring for her on his own. TOC has been consulted to assist with placement.  ?- Delirium precautions ?- Seroquel 25 mg qhs ?- Xanax 0.25  mg qhs ? ?#Elevated BUN, improving ?BUN elevated at 44 on admission, likely in the setting of dehydration. BUN is downtrending now that she received IV fluids, most recently 28. ?- Daily BMP ? ?#Hypertension ?BP 100-120s overnight and this morning. Recommend checking BP on right arm, as patient has a left humerus fracture and checking BP on this arm causes significant pain.  ?- Continue home lisinopril 10 mg  ? ?Best Practice: ?Diet: Diabetic diet ?IVF: Fluids:  none ?VTE: enoxaparin (LOVENOX) injection 40 mg Start: 07/07/21 2200 ?Code: DNR ?AB: none ?Therapy Recs: Pending ?Family Contact: Husband, Ron, to be notified. ?DISPO: Anticipated discharge to  SNF vs home vs memory care unit  pending Medical stability and placement . ? ?Signature: ?Elza Rafter, D.O.  ?Internal Medicine Resident, PGY-1 ?[facility]  ?Pager: [phone] ?6:41 AM, 07/08/2021  ? ?Please contact the on call pager after 5 pm and on weekends at [phone]. ? ?

## 2021-07-08 NOTE — Care Management Obs Status (Signed)
MEDICARE OBSERVATION STATUS NOTIFICATION ? ? ?Patient Details  ?Name: Victoria Tyler ?MRN: [id number] ?Date of Birth: 1940-07-12 ? ? ?Medicare Observation Status Notification Given:  Yes ? ? ? ?Lawerance Sabal, RN ?07/08/2021, 3:16 PM ?

## 2021-07-09 DIAGNOSIS — E872 Acidosis, unspecified: Secondary | ICD-10-CM

## 2021-07-09 DIAGNOSIS — F0390 Unspecified dementia without behavioral disturbance: Secondary | ICD-10-CM

## 2021-07-09 DIAGNOSIS — E1110 Type 2 diabetes mellitus with ketoacidosis without coma: Secondary | ICD-10-CM | POA: Diagnosis not present

## 2021-07-09 DIAGNOSIS — F0394 Unspecified dementia, unspecified severity, with anxiety: Secondary | ICD-10-CM | POA: Diagnosis not present

## 2021-07-09 DIAGNOSIS — W19XXXA Unspecified fall, initial encounter: Secondary | ICD-10-CM

## 2021-07-09 DIAGNOSIS — I10 Essential (primary) hypertension: Secondary | ICD-10-CM

## 2021-07-09 DIAGNOSIS — S42202A Unspecified fracture of upper end of left humerus, initial encounter for closed fracture: Secondary | ICD-10-CM | POA: Diagnosis not present

## 2021-07-09 LAB — CBC
HCT: 35.3 % — ABNORMAL LOW (ref 36.0–46.0)
Hemoglobin: 11.8 g/dL — ABNORMAL LOW (ref 12.0–15.0)
MCH: 31 pg (ref 26.0–34.0)
MCHC: 33.4 g/dL (ref 30.0–36.0)
MCV: 92.7 fL (ref 80.0–100.0)
Platelets: 253 10*3/uL (ref 150–400)
RBC: 3.81 MIL/uL — ABNORMAL LOW (ref 3.87–5.11)
RDW: 13.1 % (ref 11.5–15.5)
WBC: 13.2 10*3/uL — ABNORMAL HIGH (ref 4.0–10.5)
nRBC: 0 % (ref 0.0–0.2)

## 2021-07-09 LAB — BASIC METABOLIC PANEL
Anion gap: 11 (ref 5–15)
BUN: 20 mg/dL (ref 8–23)
CO2: 17 mmol/L — ABNORMAL LOW (ref 22–32)
Calcium: 8.5 mg/dL — ABNORMAL LOW (ref 8.9–10.3)
Chloride: 107 mmol/L (ref 98–111)
Creatinine, Ser: 0.86 mg/dL (ref 0.44–1.00)
GFR, Estimated: >60 mL/min (ref >60)
Glucose, Bld: 262 mg/dL — ABNORMAL HIGH (ref 70–99)
Potassium: 3.8 mmol/L (ref 3.5–5.1)
Sodium: 135 mmol/L (ref 135–145)

## 2021-07-09 LAB — LACTIC ACID, PLASMA: Lactic Acid, Venous: 1.1 mmol/L (ref 0.5–1.9)

## 2021-07-09 LAB — GLUCOSE, CAPILLARY
Glucose-Capillary: 211 mg/dL — ABNORMAL HIGH (ref 70–99)
Glucose-Capillary: 222 mg/dL — ABNORMAL HIGH (ref 70–99)
Glucose-Capillary: 185 mg/dL — ABNORMAL HIGH (ref 70–99)

## 2021-07-09 MED ORDER — ALPRAZOLAM 0.5 MG PO TABS
0.2500 mg | ORAL_TABLET | Freq: Once | ORAL | Status: AC
  Administered 2021-07-09: 0.25 mg via ORAL
  Filled 2021-07-09: qty 1

## 2021-07-09 MED ORDER — QUETIAPINE FUMARATE 25 MG PO TABS: 25.0000 mg | ORAL_TABLET | Freq: Every day | ORAL | Status: AC

## 2021-07-09 MED ORDER — INSULIN GLARGINE-YFGN 100 UNIT/ML ~~LOC~~ SOLN
14.0000 [IU] | Freq: Every day | SUBCUTANEOUS | Status: DC
  Administered 2021-07-09: 14 [IU] via SUBCUTANEOUS
  Filled 2021-07-09: qty 0.14

## 2021-07-09 MED ORDER — ACETAMINOPHEN 325 MG PO TABS: 650.0000 mg | ORAL_TABLET | Freq: Four times a day (QID) | ORAL | Status: AC | PRN

## 2021-07-09 MED ORDER — ALPRAZOLAM 0.25 MG PO TABS: 0.2500 mg | ORAL_TABLET | Freq: Every evening | ORAL | 0 refills | Status: AC | PRN

## 2021-07-09 MED ORDER — EMPAGLIFLOZIN 10 MG PO TABS: 10.0000 mg | ORAL_TABLET | Freq: Every day | ORAL | Status: AC

## 2021-07-09 MED ORDER — INSULIN GLARGINE-YFGN 100 UNIT/ML ~~LOC~~ SOLN: 14.0000 [IU] | Freq: Every day | SUBCUTANEOUS | 11 refills | Status: AC

## 2021-07-09 NOTE — Progress Notes (Signed)
Pt is having auditory and visual hallucinations. Pt states there is someone in her room. Reassure pt that she was safe and that no one was in her room. Pt becomes very anxious. Respirations become elevated at 30bpm. Pt attempt to get out of bed. Pt pulling off all EKG wires and gown. Pt is yelling "there's a train in my room," repeatly.continue to reorient pt. Pt is unable to be reoriented. Dr. Welton Flakes notified. New order noted.  ?

## 2021-07-09 NOTE — Progress Notes (Signed)
? ?HD#2 ?SUBJECTIVE:  ?Patient Summary: Victoria Tyler is a 81 y.o. with a pertinent PMH of dementia, uncontrolled diabetes, and hypertension, who presented with a fall and admitted for a fall, left humerus fracture, and mild DKA that has resolved.  ? ?Overnight Events: Patient became agitated overnight and received an extra dose of 0.25 mg xanax.  ? ?Interim History: This is hospital day 2 for this patient who was seen and evaluated at the bedside this afternoon. She is not complaining of any pain and feels well. Alert and oriented to self and speech is understandable.  ? ?OBJECTIVE:  ?Vital Signs: ?Vitals:  ? 07/08/21 2005 07/08/21 2322 07/09/21 0303 07/09/21 0755  ?BP: (!) 115/59 (!) 129/49 118/64 (!) 163/85  ?Pulse: 64 97 92 87  ?Resp: 20 20 20 20   ?Temp: 97.8 ?F (36.6 ?C) 98.1 ?F (36.7 ?C) 97.8 ?F (36.6 ?C) 97.7 ?F (36.5 ?C)  ?TempSrc: Axillary Oral Axillary Oral  ?SpO2:  95% 100% 97%  ? ?Supplemental O2: Room Air ?SpO2: 97 % ? ?There were no vitals filed for this visit. ? ? ?Intake/Output Summary (Last 24 hours) at 07/09/2021 0801 ?Last data filed at 07/08/2021 2100 ?Gross per 24 hour  ?Intake 480 ml  ?Output 550 ml  ?Net -70 ml  ? ?Net IO Since Admission: 593.77 mL [07/09/21 0801] ? ?Physical Exam: ?General: Pleasant, elderly female laying in bed. No acute distress. ?HENT: Laceration over left forehead with sutures in place. Left eye with edema and ecchymosis.  ?CV: Regular rate, irregular rhythm. No murmurs. No LE edema.  ?Pulmonary: Lungs CTAB. Normal effort. No wheezing or rales. ?Abdominal: Soft, nontender, nondistended. Normal bowel sounds. ?Extremities: Tenderness over left shoulder and upper arm- limited ROM.  ?Skin: Warm and dry.  ?Neuro: A&O to self. Does not know year or that she is in a hospital. Speech is understandable, although not appropriate in response to questioning/conversation.  ?Psych: Normal, pleasant mood and affect ? ? ? ?ASSESSMENT/PLAN:  ?Assessment: ?Principal Problem: ?  Fall ?Active  Problems: ?  Closed fracture of left proximal humerus ? ? ?Plan: ?#Unwitnessed fall ?#Comminuted fracture of proximal left humerus  ?Patient had a mechanical fall prior to arrival and suffered a comminuted fracture in the neck of the proximal left humerus. Left shoulder sling/immobilizer should be applied, as it is recommended to keep the left shoulder immobile. Will need to follow with orthopedics as an outpatient. PT/OT recommending SNF. Appreciate TOC's assistance with placement for this patient.  ? ?#Uncontrolled type 2 diabetes ?#Mild DKA, resolved ?A1c 13.2. Patient was in a mild case of DKA on admission and was started on Endotool, although she was transitioned off of this yesterday after her anion gap closed x2. Patient started on subcutaneous insulin. CBGs 160-260s over the last 24 hrs.  ?- Increase semglee to 14u daily + SSI ?- CBGs four times daily  ? ?#Lactic acidosis, resolved ?Patient's bicarb remains low at 17, although lactic acid is improved from its peak of 4.7 to 1.1. Clinically, the patient appears well and euvolemic.  ?  ?#Dementia ?The patient's memory has declined over the last year, per her husband. He has been trying to get the patient into a long-term nursing facility or memory care unit, as he has been having difficulties caring for her on his own. TOC has been consulted to assist with placement, whether that is at a memory care unit or SNF. Overnight patient has had a few episodes of agitation that improved with an extra dose of 0.25 mg of  xanax.  ?- Delirium precautions ?- Seroquel 25 mg qhs ?- Xanax 0.25 mg qhs ? ?#Elevated BUN, resolved ?Likely was in the setting of dehydration from DKA. Renal function, including BUN and Cr are within normal limits.  ? ?#Hypertension ?BP 110-150s thus far today. She is on lisinopril 10 mg daily at home and we have continued this.  ?- Continue lisinopril 10 mg daily ? ?Best Practice: ?Diet: Diabetic diet ?IVF: Fluids: none ?VTE: enoxaparin (LOVENOX)  injection 40 mg Start: 07/07/21 2200 ?Code: DNR ?AB: none ?Therapy Recs: SNF ?Family Contact: Husband, Ron, to be notified. ?DISPO: Anticipated discharge to Skilled nursing facility pending  SNF placement . ? ?Signature: ?Elza Rafter, D.O.  ?Internal Medicine Resident, PGY-1 ?[facility]  ?Pager: [phone] ?8:01 AM, 07/09/2021  ? ?Please contact the on call pager after 5 pm and on weekends at [phone]. ? ?

## 2021-07-09 NOTE — Discharge Summary (Signed)
? ?Name: Victoria Tyler ?MRN: [id number] ?DOB: 15-Jul-1940 81 y.o. ?PCP: Bartholome Bill, MD ? ?Date of Admission: 07/07/2021  1:08 PM ?Date of Discharge:  07/09/2021 ?Attending Physician: Dr. Angelia Mould ? ?DISCHARGE DIAGNOSIS:  ?Primary Problem: Fall  ? ?Hospital Problems: ?Principal Problem: ?  Fall ?Active Problems: ?  Essential hypertension ?  Closed fracture of left proximal humerus ?  DKA (diabetic ketoacidosis) ([place]) ?  Dementia ([place]) ?  Lactic acidemia ?  ? ?DISCHARGE MEDICATIONS:  ? ?Allergies as of 07/09/2021   ? ?   Reactions  ? Ciprofloxacin   ? Hives. Patient reported it was itching at the IV site.  ? Penicillin G   ? Other reaction(s): Unknown  ? ?  ? ?  ?Medication List  ?  ? ?TAKE these medications   ? ?acetaminophen 325 MG tablet ?Commonly known as: TYLENOL ?Take 2 tablets (650 mg total) by mouth every 6 (six) hours as needed for mild pain (or Fever >/= 101). ?  ?alendronate 70 MG tablet ?Commonly known as: FOSAMAX ?Take 70 mg by mouth once a week. ?  ?ALPRAZolam 0.25 MG tablet ?Commonly known as: Duanne Moron ?Take 1 tablet (0.25 mg total) by mouth at bedtime as needed for up to 14 days for anxiety or sleep. ?  ?Calcium Carbonate-Vitamin D 600-400 MG-UNIT tablet ?Take 1 tablet by mouth daily. ?  ?empagliflozin 10 MG Tabs tablet ?Commonly known as: Jardiance ?Take 1 tablet (10 mg total) by mouth daily before breakfast. ?  ?furosemide 20 MG tablet ?Commonly known as: LASIX ?Take 20 mg by mouth daily. ?  ?gabapentin 100 MG capsule ?Commonly known as: NEURONTIN ?Take 1 capsule by mouth every evening. ?  ?insulin glargine-yfgn 100 UNIT/ML injection ?Commonly known as: SEMGLEE ?Inject 0.14 mLs (14 Units total) into the skin daily. ?Start taking on: July 10, 2021 ?  ?lisinopril 10 MG tablet ?Commonly known as: ZESTRIL ?Take 10 mg by mouth daily. ?  ?metFORMIN 500 MG tablet ?Commonly known as: GLUCOPHAGE ?Take 500 mg by mouth 2 (two) times daily with a meal. ?  ?multivitamin with minerals Tabs tablet ?Take 1 tablet  by mouth daily. ?  ?omeprazole 40 MG capsule ?Commonly known as: PRILOSEC ?Take 40 mg by mouth 2 (two) times daily. ?  ?potassium chloride 10 MEQ tablet ?Commonly known as: KLOR-CON M ?Take 10 mEq by mouth daily. ?  ?QUEtiapine 25 MG tablet ?Commonly known as: SEROQUEL ?Take 1 tablet (25 mg total) by mouth at bedtime. ?  ?trimethoprim 100 MG tablet ?Commonly known as: TRIMPEX ?Take 100 mg by mouth daily. ?  ?vitamin C 1000 MG tablet ?Take 1,000 mg by mouth daily. ?  ?Vitamin D3 25 MCG (1000 UT) Caps ?Take 1,000 Units by mouth daily. ?  ? ?  ? ? ?DISPOSITION AND FOLLOW-UP:  ?Ms.Kennedee Seiden was discharged from [facility] in [place] condition. At the hospital follow up visit please address: ? ?Fall/Left humerus fracture: Recommend patient wear a left arm sling and keep her shoulder immobile. Outpatient referral to ortho placed. She will discharge to SNF ? ?Uncontrolled diabetes/mild DKA: Started on IV insulin in the setting of mild DKA, which resolved. Discharged with home metformin and jardiance and started on semglee 14u daily. Follow up CBGs and A1c.  ? ?Follow-up Recommendations: ?Consults: Ortho ?Labs: Blood Sugar and Hgb A1C ?Studies: none ?Medications: Semglee 14u daily (NEW) ? ?Follow-up Appointments: ? Contact information for follow-up providers   ? ? Bartholome Bill, MD. Schedule an appointment as soon as possible  for a visit.   ?Specialty: Family Medicine ?Why: please make hospital follow up appointment ?Contact information: ?[address] ?[place] [address] ?[phone] ? ? ?  ?  ? ?  ?  ? ? Contact information for after-discharge care   ? ? Destination   ? ? [place] .   ?Service: Skilled Nursing ?Contact information: ?[address] ?[place] ?[phone] ? ?  ?  ? ?  ?  ? ?  ?  ? ?  ? ? ?HOSPITAL COURSE:  ?Patient Summary: ?#Unwitnessed fall ?#Comminuted fracture of proximal left humerus  ?Patient  presented after a presumed mechanical fall (unwitnessed) and was found to have a laceration over her left forehead. Significant bleeding was noted and the ED providers are planning to place sutures prior to the patient being admitted. CT head/CT maxillofacial/CT cervical spine were negative for any acute intracranial pathology and did not show any fractures/dislocations of the facial bones or cervical spine. Left shoulder xray did show a comminuted fracture in the neck of the proximal left humerus- likely can see ortho as an outpatient and we strongly recommend that she keep the left arm in a sling/immobilizer in the interim. Thoracic and lumbar xrays with no acute osseous injury, but did note chronic degenerative changes and chronic anterolisthesis at L4-5 and L5-S1. Suspect that the patient's fall is multifactorial, in the setting of her shuffling gait/balance issues, troubles with depth perception (per husband), and possible neuropathy from uncontrolled diabetes/DKA. PT/OT recommending SNF for this patient.  ?  ?#Uncontrolled type 2 diabetes ?#Mild DKA, resolved ?A1c 13.2. Patient is on metformin 500 mg bid and jardiance 10 mg daily at home. Glucose level 474 on admission BMP, with bicarb level mildly low at 19 and anion gap of 16. Urinalysis with >500 glucose and 20 ketones. Beta-hydroxybutyric acid level slightly elevated at 1.01. Patient is also tachypneic, with RR in the 20-30s upon my initial. evaluation Patient was in a mild case of DKA on admission and was started on Endotool. She was transitioned off of the Endotool on the morning of 3/26 (anion gap closed x2) and started on subcutaneous insulin and a carb modified diet. Will discharge with Semglee 14u daily and her home metformin and jardiance. She will need to follow up with her PCP regarding this.  ? ?#Lactic acidosis, resolved ?Lactic acid peaked at 4.7 and improved to 3.8 on 3/26 with IVF fluids. Clinically, the patient appeared well and she received  IV fluids with the Endotool. Will hold off on further fluids at this time, as she is euvolemic. Lactic acid improved on 3/27 to 1.1.  ?  ?#Dementia ?The patient's memory has declined over the last year, per her husband. He has been trying to get the patient into a long-term nursing facility or memory care unit, as he has been having difficulties caring for her on his own. Started on her home Seroquel 25 mg qhs and Xanax 0.25 mg qhs. Delirium precautions were applied.  ?  ?#Elevated BUN, resolved ?BUN elevated at 44 on admission, likely in the setting of dehydration from mild DKA. Renal function was normal on the day of discharge.  ?  ?#Hypertension ?BP 110-150s. Recommend checking BP on right arm, as patient has a left humerus fracture and checking BP on this arm causes significant pain. Continued home lisinopril 10 mg daily.  ? ?DISCHARGE INSTRUCTIONS:  ? ?Discharge Instructions   ? ? Ambulatory referral to Orthopedic Surgery  Complete by: As directed ?  ? Left humerus fracture  ? Call MD for:  difficulty breathing, headache or visual disturbances   Complete by: As directed ?  ? Call MD for:  redness, tenderness, or signs of infection (pain, swelling, redness, odor or green/yellow discharge around incision site)   Complete by: As directed ?  ? Call MD for:  severe uncontrolled pain   Complete by: As directed ?  ? Call MD for:  temperature >100.4   Complete by: As directed ?  ? Diet Carb Modified   Complete by: As directed ?  ? Discharge instructions   Complete by: As directed ?  ? Dear Ms. Rubye Beach, ? ?You were hospitalized because you fell and broke your left arm (humerus bone). You should wear an arm sling and keep that arm immobile, until you are able to follow up with an orthopedic doctor. Additionally, you are going to a rehab facility to build up your strength and to hopefully prevent any future falls! You will need to get your left forehead stitches out in about one week, too.  ? ?You also had dangerously high  sugar levels when you were here and we had to start you on insulin. Your sugars are much better now, although they are still not quite at the goal we would like them to be. Continue taking your metformin tw

## 2021-07-09 NOTE — NC FL2 (Signed)
?[place] MEDICAID FL2 LEVEL OF CARE SCREENING TOOL  ?  ? ?IDENTIFICATION  ?Patient Name: ?Victoria Tyler Birthdate: November 15, 1940 Sex: female Admission Date (Current Location): ?07/07/2021  ?[place] Number: ? [place] ?  Facility and Address:  ?[facility], [address] [place], [place] [address] ?     Provider Number: ?[id number]  ?Attending Physician Name and Address:  ?Gust Rung, DO ? Relative Name and Phone Number:  ?  ?   ?Current Level of Care: ?Hospital Recommended Level of Care: ?Skilled Nursing Facility Prior Approval Number: ?  ? ?Date Approved/Denied: ?  PASRR Number: ?[id number] A ? ?Discharge Plan: ?SNF ?  ? ?Current Diagnoses: ?Patient Active Problem List  ? Diagnosis Date Noted  ? Closed fracture of left proximal humerus   ? Chronic cystitis 05/21/2019  ? Incomplete emptying of bladder 05/21/2019  ? Insomnia secondary to depression with anxiety 03/18/2019  ? Leg swelling 03/17/2019  ? Educated about COVID-19 virus infection 03/17/2019  ? Cognitive impairment 02/23/2019  ? Constipation 02/23/2019  ? History of hypertension 06/24/2018  ? Unintentional weight loss 11/05/2016  ? Hypoglycemia 10/30/2015  ? Fall 10/30/2015  ? Annual physical exam 07/20/2014  ? Colitis 04/24/2014  ? Essential hypertension 04/24/2014  ? Controlled type 2 diabetes mellitus with microalbuminuria or microproteinuria 04/24/2014  ? Hypotension 04/24/2014  ? High anion gap metabolic acidosis 04/24/2014  ? Acute kidney injury (HCC) 04/24/2014  ? Hyponatremia 04/24/2014  ? Normocytic anemia 04/24/2014  ? Infectious colitis 04/24/2014  ? Arthritis 12/24/2010  ? Gastroesophageal reflux disease without esophagitis 12/24/2010  ? History of recurrent UTIs 12/24/2010  ? Urinary incontinence, functional 12/24/2010  ? ? ?Orientation RESPIRATION BLADDER Height & Weight   ?  ?Self ? Normal Continent Weight:   ?Height:     ?BEHAVIORAL SYMPTOMS/MOOD NEUROLOGICAL BOWEL NUTRITION STATUS  ?   (Dementia)  Continent Diet (See dc summary)  ?AMBULATORY STATUS COMMUNICATION OF NEEDS Skin   ?Extensive Assist Verbally Other (Comment) (Laceration on face) ?  ?  ?  ?    ?     ?     ? ? ?Personal Care Assistance Level of Assistance  ?Bathing, Feeding, Dressing Bathing Assistance: Maximum assistance ?Feeding assistance: Maximum assistance ?Dressing Assistance: Maximum assistance ?   ? ?Functional Limitations Info  ?    ?  ?   ? ? ?SPECIAL CARE FACTORS FREQUENCY  ?PT (By licensed PT), OT (By licensed OT)   ?  ?PT Frequency: 5x/week ?OT Frequency: 5x/week ?  ?  ?  ?   ? ? ?Contractures Contractures Info: Not present  ? ? ?Additional Factors Info  ?Psychotropic, Insulin Sliding Scale Code Status Info: DNR ?Allergies Info: Ciprofloxacin, Penicillin G ?Psychotropic Info: Seroquel ?Insulin Sliding Scale Info: see dc summary ?  ?   ? ?Current Medications (07/09/2021):  This is the current hospital active medication list ?Current Facility-Administered Medications  ?Medication Dose Route Frequency Provider Last Rate Last Admin  ? acetaminophen (TYLENOL) tablet 650 mg  650 mg Oral Q6H PRN Atway, Rayann N, DO   650 mg at 07/07/21 2117  ? Or  ? acetaminophen (TYLENOL) suppository 650 mg  650 mg Rectal Q6H PRN Atway, Rayann N, DO      ? ALPRAZolam Prudy Feeler) tablet 0.25 mg  0.25 mg Oral QHS PRN Atway, Rayann N, DO   0.25 mg at 07/09/21 0031  ? ascorbic acid (VITAMIN C) tablet 1,000 mg  1,000 mg Oral Daily Atway, Rayann N, DO   1,000  mg at 07/09/21 0902  ? cholecalciferol (VITAMIN D3) tablet 1,000 Units  1,000 Units Oral Daily Atway, Rayann N, DO   1,000 Units at 07/09/21 0902  ? dextrose 50 % solution 0-50 mL  0-50 mL Intravenous PRN Steffanie Rainwater, MD      ? enoxaparin (LOVENOX) injection 40 mg  40 mg Subcutaneous Q24H Atway, Rayann N, DO   40 mg at 07/08/21 2122  ? insulin aspart (novoLOG) injection 0-15 Units  0-15 Units Subcutaneous TID WC Andrey Campanile, MD   5 Units at 07/09/21 1914  ? insulin glargine-yfgn (SEMGLEE) injection  14 Units  14 Units Subcutaneous Daily Atway, Rayann N, DO      ? lactated ringers infusion   Intravenous Continuous Katsadouros, Vasilios, MD 75 mL/hr at 07/08/21 2100 New Bag at 07/08/21 2100  ? lidocaine-EPINEPHrine-tetracaine (LET) topical gel  3 mL Topical Once Linwood Dibbles, MD      ? lisinopril (ZESTRIL) tablet 10 mg  10 mg Oral Daily Atway, Rayann N, DO   10 mg at 07/09/21 0902  ? multivitamin with minerals tablet 1 tablet  1 tablet Oral Daily Atway, Rayann N, DO   1 tablet at 07/09/21 0902  ? pantoprazole (PROTONIX) EC tablet 40 mg  40 mg Oral Daily Atway, Rayann N, DO   40 mg at 07/09/21 0902  ? QUEtiapine (SEROQUEL) tablet 25 mg  25 mg Oral QHS Atway, Rayann N, DO   25 mg at 07/08/21 2122  ? senna-docusate (Senokot-S) tablet 1 tablet  1 tablet Oral QHS PRN Atway, Rayann N, DO      ? ? ? ?Discharge Medications: ?Please see discharge summary for a list of discharge medications. ? ?Relevant Imaging Results: ? ?Relevant Lab Results: ? ? ?Additional Information ?SSN: [id number]. Moderna COVID-19 Vaccine 12/20/20, 07/13/2020 , 02/04/2020 , 07/05/2019, 06/09/2019 ? ?Renne Crigler [place], LCSW ? ? ? ? ?

## 2021-07-09 NOTE — TOC Transition Note (Signed)
Transition of Care (TOC) - CM/SW Discharge Note ? ? ?Patient Details  ?Name: Victoria Tyler ?MRN: [id number] ?Date of Birth: 09/22/1940 ? ?Transition of Care (TOC) CM/SW Contact:  ?Mearl Latin, LCSW ?Phone Number: ?07/09/2021, 3:12 PM ? ? ?Clinical Narrative:    ?Patient will DC to: [facility] ?Anticipated DC date: 07/09/21 ?Family notified: Spouse, Ron ?Transport by: Sharin Mons ? ? ?Per MD patient ready for DC to [facility]. RN to call report prior to discharge [phone]). RN, patient, patient's family, and facility notified of DC. Discharge Summary and FL2 sent to facility. DC packet on chart including signed script and DNR. Ambulance transport requested for patient.  ? ?CSW will sign off for now as social work intervention is no longer needed. Please consult [place] again if new needs arise. ? ? ? ? ?Final next level of care: Skilled Nursing Facility ?Barriers to Discharge: Barriers Resolved ? ? ?Patient Goals and CMS Choice ?Patient states their goals for this hospitalization and ongoing recovery are:: Memory care placement ?CMS Medicare.gov Compare Post Acute Care list provided to:: Patient Represenative (must comment) ?Choice offered to / list presented to : Spouse ? ?Discharge Placement ?  ?Existing PASRR number confirmed : 07/09/21          ?Patient [facility] bed at: [facility] ?Patient to be transferred to facility by: [facility] ?Name of family member notified: Spouse ?Patient and family notified of of transfer: 07/09/21 ? ?Discharge Plan and Services ?In-house Referral: Clinical Social Work ?  ?Post Acute Care Choice: Skilled Nursing Facility          ?  ?  ?  ?  ?  ?  ?  ?  ?  ?  ? ?Social Determinants of Health (SDOH) Interventions ?  ? ? ?Readmission Risk Interventions ?   ? View : No data to display.  ?  ?  ?  ? ? ? ? ? ?

## 2021-07-09 NOTE — Progress Notes (Signed)
Inpatient Diabetes Program Recommendations ? ?AACE/ADA: New Consensus Statement on Inpatient Glycemic Control (2015) ? ?Target Ranges:  Prepandial:   less than 140 mg/dL ?     Peak postprandial:   less than 180 mg/dL (1-2 hours) ?     Critically ill patients:  140 - 180 mg/dL  ? ?Lab Results  ?Component Value Date  ? GLUCAP 211 (H) 07/09/2021  ? HGBA1C 5.2 10/31/2015  ? ? ?Review of Glycemic Control ? Latest Reference Range & Units 07/08/21 16:50 07/08/21 20:07 07/09/21 08:52  ?Glucose-Capillary 70 - 99 mg/dL 619 (H) 509 (H) 326 (H)  ?(H): Data is abnormally high ? ?Diabetes history: DM2 ?Outpatient Diabetes medications: Metformin 500 mg BID, Jardiance 10 mg QD ?Current orders for Inpatient glycemic control: Semglee 14 units QD, Novolog 0-15 units TID ? ?Received referral for DM management evaluation.  Semglee increased today from 10 units to 14 units.   In agreement based on IV insulin drip rates.  Will follow.   ? ? ? ?Thank you, ?Dulce Sellar, MSN, RN ?Diabetes Coordinator ?Inpatient Diabetes Program ?[phone] (team pager from 8a-5p) ? ? ? ? ? ?

## 2021-07-09 NOTE — TOC CAGE-AID Note (Signed)
Transition of Care (TOC) - CAGE-AID Screening ? ? ?Patient Details  ?Name: Victoria Tyler ?MRN: [id number] ?Date of Birth: 09-29-1940 ? ?Transition of Care (TOC) CM/SW Contact:    ?Sharicka Pogorzelski C Tarpley-Carter, LCSWA ?Phone Number: ?07/09/2021, 2:10 PM ? ? ?Clinical Narrative: ?Pt participated in Cage-Aid.  Per pts husband, she does not use substance or ETOH.  Pt was not offered resources, due to no usage of substance or ETOH.  ? ?Insurance underwriter, MSW, LCSW-A ?Pronouns:  She/Her/Hers ?[facility] ?Clinical Social Worker ?Direct Number:  [phone] ?[email or web address] ?   ?CAGE-AID Screening: ?  ? ?Have You Ever Felt You Ought to Cut Down on Your Drinking or Drug Use?: No ?Have People Annoyed You By Critizing Your Drinking Or Drug Use?: No ?Have You Felt Bad Or Guilty About Your Drinking Or Drug Use?: No ?Have You Ever Had a Drink or Used Drugs First Thing In The Morning to Steady Your Nerves or to Get Rid of a Hangover?: No ?CAGE-AID Score: 0 ? ?Substance Abuse Education Offered: No ? ?  ? ? ? ? ? ? ?

## 2021-07-09 NOTE — TOC Initial Note (Addendum)
Transition of Care (TOC) - Initial/Assessment Note  ? ? ?Patient Details  ?Name: Victoria Tyler ?MRN: [id number] ?Date of Birth: 04-10-1941 ? ?Transition of Care (TOC) CM/SW Contact:    ?Mearl Latin, LCSW ?Phone Number: ?07/09/2021, 9:44 AM ? ?Clinical Narrative:                 ?9:44am-CSW received consult for memory care placement. CSW spoke with patient's spouse. He explained that he is unable to care for patient anymore. He stated that he went to DSS last week to apply for Medicaid and for help with placement but was told that since she is in the hospital, the hospital will help with placement and that they will need an FL2 from the facility she ends up going to. Per spouse, patient makes about $1100/month in SSI and he does not have any extra funds for placement. CSW explained potential placement barriers, especially if insurance does not cover SNF for rehab and if facility will not accept patient Medicaid-pending. Currently as patient is requiring 2 person assist, unsure that memory care/ALF level would accept patient so will pursue memory care at [facility]. ? ?CSW spoke with Nicholas Lose [phone]) to see if she needs FL2 to from CSW but she declined stating she will obtain it from the facility patient ends up going to at discharge. CSW explained potential barriers since private pay would be difficult for spouse.  ? ?Will send referral to locked SNF units for review. CSW staffed case with [facility] Supervisor as well who will reach out to [facility].  ? ? ?12pm-No bed offers at this time. CSW reached out to [facility] memory care. They do not have a bed at this time but will add patient to waitlist.  ? ?1:15pm-CSW received call from Clydie Braun at [facility] who stated they can accept patient as they had a bed open. CSW contacted patient's spouse who is accepting of plan and hopes patient can eventually get stronger to transition to memory care/ALF level. CSW initiated insurance authorization process with  [employer] for SNF and PT[facility]. CSW also left message with the administrator at [facility] to make them aware of patient's current condition in the even they can accept her after rehab as per spouse's preference.  ? ?Dawson from [facility] returned call and stated they have been following patient. They will be willing to accept her Medicaid pending after her rehab stay and will keep in touch with [facility].  ? ?2:30pm-CSW received insurance approval for SNF for 7 days, #[id number], PTAR auth# [id number]. CSW updated MD and patient's spouse who is in agreement with plan. Natasha from DSS will follow up with [facility].  ? ? ?Expected Discharge Plan: Skilled Nursing Facility ?Barriers to Discharge: SNF Pending Medicaid, SNF Pending bed offer, Insurance Authorization ? ? ?Patient Goals and CMS Choice ?Patient states their goals for this hospitalization and ongoing recovery are:: Memory care placement ?CMS Medicare.gov Compare Post Acute Care list provided to:: Patient Represenative (must comment) ?Choice offered to / list presented to : Spouse ? ?Expected Discharge Plan and Services ?Expected Discharge Plan: Skilled Nursing Facility ?In-house Referral: Clinical Social Work ?  ?Post Acute Care Choice: Skilled Nursing Facility ?Living arrangements for the past 2 months: Apartment ?                ?  ?  ?  ?  ?  ?  ?  ?  ?  ?  ? ?Prior Living Arrangements/Services ?Living arrangements for the past 2  months: Apartment ?Lives with:: Spouse ?Patient language and need for interpreter reviewed:: Yes ?Do you feel safe going back to the place where you live?: Yes      ?Need for Family Participation in Patient Care: Yes (Comment) ?Care giver support system in place?: Yes (comment) ?  ?Criminal Activity/Legal Involvement Pertinent to Current Situation/Hospitalization: No - Comment as needed ? ?Activities of Daily Living ?  ?  ? ?Permission Sought/Granted ?Permission sought to share information with : Facility Social worker, Family Supports ?Permission granted to share information with : No ? Share Information with NAME: Ron ? Permission granted to share info w AGENCY: SNFs ? Permission granted to share info w Relationship: Spouse ? Permission granted to share info w Contact Information: [phone] ? ?Emotional Assessment ?Appearance:: Appears stated age ?Attitude/Demeanor/Rapport: Unable to Assess ?Affect (typically observed): Unable to Assess ?Orientation: : Oriented to Self ?Alcohol / Substance Use: Not Applicable ?Psych Involvement: No (comment) ? ?Admission diagnosis:  Fall [W19.XXXA] ?AKI (acute kidney injury) (HCC) [N17.9] ?Injury of head, initial encounter [S09.90XA] ?Closed fracture of proximal end of left humerus, unspecified fracture morphology, initial encounter [S42.202A] ?Concussion with unknown loss of consciousness status, initial encounter [S06.0XAA] ?Patient Active Problem List  ? Diagnosis Date Noted  ? Closed fracture of left proximal humerus   ? Chronic cystitis 05/21/2019  ? Incomplete emptying of bladder 05/21/2019  ? Insomnia secondary to depression with anxiety 03/18/2019  ? Leg swelling 03/17/2019  ? Educated about COVID-19 virus infection 03/17/2019  ? Cognitive impairment 02/23/2019  ? Constipation 02/23/2019  ? History of hypertension 06/24/2018  ? Unintentional weight loss 11/05/2016  ? Hypoglycemia 10/30/2015  ? Fall 10/30/2015  ? Annual physical exam 07/20/2014  ? Colitis 04/24/2014  ? Essential hypertension 04/24/2014  ? Controlled type 2 diabetes mellitus with microalbuminuria or microproteinuria 04/24/2014  ? Hypotension 04/24/2014  ? High anion gap metabolic acidosis 04/24/2014  ? Acute kidney injury (HCC) 04/24/2014  ? Hyponatremia 04/24/2014  ? Normocytic anemia 04/24/2014  ? Infectious colitis 04/24/2014  ? Arthritis 12/24/2010  ? Gastroesophageal reflux disease without esophagitis 12/24/2010  ? History of recurrent UTIs 12/24/2010  ? Urinary incontinence, functional 12/24/2010   ? ?PCP:  Verlon Au, MD ?Pharmacy:   ?[id number] - [place] [place] - [address] ?[address] ?[place] [address] ?Phone: [phone] Fax: [phone] ? ? ? ? ?Social Determinants of Health (SDOH) Interventions ?  ? ?Readmission Risk Interventions ?   ? View : No data to display.  ?  ?  ?  ? ? ? ?

## 2021-09-13 DEATH — deceased
